# Patient Record
Sex: Male | Born: 1950
Health system: Southern US, Community
[De-identification: ages and names within clinical notes are randomized; demographics above are authoritative.]

## PROBLEM LIST (undated history)

## (undated) DIAGNOSIS — I2510 Atherosclerotic heart disease of native coronary artery without angina pectoris: Secondary | ICD-10-CM

## (undated) DIAGNOSIS — K219 Gastro-esophageal reflux disease without esophagitis: Secondary | ICD-10-CM

## (undated) DIAGNOSIS — I2089 Other forms of angina pectoris: Secondary | ICD-10-CM

## (undated) DIAGNOSIS — E785 Hyperlipidemia, unspecified: Secondary | ICD-10-CM

## (undated) DIAGNOSIS — I208 Other forms of angina pectoris: Secondary | ICD-10-CM

## (undated) DIAGNOSIS — D126 Benign neoplasm of colon, unspecified: Secondary | ICD-10-CM

## (undated) DIAGNOSIS — E079 Disorder of thyroid, unspecified: Secondary | ICD-10-CM

## (undated) DIAGNOSIS — K5730 Diverticulosis of large intestine without perforation or abscess without bleeding: Secondary | ICD-10-CM

## (undated) HISTORY — DX: Other forms of angina pectoris: I20.8

## (undated) HISTORY — DX: Disorder of thyroid, unspecified: E07.9

## (undated) HISTORY — DX: Benign neoplasm of colon, unspecified: D12.6

## (undated) HISTORY — DX: Hyperlipidemia, unspecified: E78.5

## (undated) HISTORY — DX: Gastro-esophageal reflux disease without esophagitis: K21.9

## (undated) HISTORY — DX: Diverticulosis of large intestine without perforation or abscess without bleeding: K57.30

## (undated) HISTORY — DX: Atherosclerotic heart disease of native coronary artery without angina pectoris: I25.10

## (undated) HISTORY — DX: Other forms of angina pectoris: I20.89

---
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
## 2004-12-03 ENCOUNTER — Ambulatory Visit: Payer: Self-pay | Admitting: Cardiology

## 2004-12-13 ENCOUNTER — Ambulatory Visit: Payer: Self-pay | Admitting: Cardiology

## 2004-12-17 ENCOUNTER — Ambulatory Visit: Payer: Self-pay | Admitting: Gastroenterology

## 2004-12-18 ENCOUNTER — Inpatient Hospital Stay (HOSPITAL_BASED_OUTPATIENT_CLINIC_OR_DEPARTMENT_OTHER): Admission: RE | Admit: 2004-12-18 | Discharge: 2004-12-18 | Payer: Self-pay | Admitting: Cardiology

## 2004-12-18 ENCOUNTER — Ambulatory Visit: Payer: Self-pay | Admitting: Cardiology

## 2004-12-30 ENCOUNTER — Ambulatory Visit: Payer: Self-pay | Admitting: Cardiology

## 2005-01-15 ENCOUNTER — Ambulatory Visit: Payer: Self-pay | Admitting: Cardiology

## 2005-01-16 ENCOUNTER — Ambulatory Visit: Payer: Self-pay | Admitting: Gastroenterology

## 2005-04-29 ENCOUNTER — Ambulatory Visit: Payer: Self-pay | Admitting: Cardiology

## 2006-03-23 ENCOUNTER — Ambulatory Visit: Payer: Self-pay | Admitting: Cardiology

## 2006-03-23 LAB — CONVERTED CEMR LAB
Albumin: 4.3 g/dL (ref 3.5–5.2)
Alkaline Phosphatase: 49 units/L (ref 39–117)
BUN: 12 mg/dL (ref 6–23)
CO2: 30 meq/L (ref 19–32)
Direct LDL: 146.4 mg/dL
GFR calc Af Amer: 80 mL/min
HDL: 35.7 mg/dL — ABNORMAL LOW (ref >39.0)
Potassium: 4.5 meq/L (ref 3.5–5.1)
Sodium: 140 meq/L (ref 135–145)
Total Protein: 7.5 g/dL (ref 6.0–8.3)
Triglycerides: 382 mg/dL (ref 0–149)
VLDL: 76 mg/dL — ABNORMAL HIGH (ref 0–40)

## 2006-08-07 ENCOUNTER — Ambulatory Visit: Payer: Self-pay | Admitting: Gastroenterology

## 2006-10-07 ENCOUNTER — Ambulatory Visit: Payer: Self-pay | Admitting: Gastroenterology

## 2006-10-07 ENCOUNTER — Encounter: Payer: Self-pay | Admitting: Gastroenterology

## 2007-01-18 ENCOUNTER — Ambulatory Visit: Payer: Self-pay | Admitting: Internal Medicine

## 2007-01-18 ENCOUNTER — Ambulatory Visit: Payer: Self-pay

## 2007-01-18 ENCOUNTER — Ambulatory Visit: Payer: Self-pay | Admitting: Cardiology

## 2007-01-18 LAB — CONVERTED CEMR LAB
Basophils Absolute: 0 10*3/uL (ref 0.0–0.1)
Basophils Relative: 0.4 % (ref 0.0–1.0)
Bilirubin, Direct: 0.1 mg/dL (ref 0.0–0.3)
CO2: 27 meq/L (ref 19–32)
Calcium: 9.3 mg/dL (ref 8.4–10.5)
Cholesterol: 201 mg/dL (ref 0–200)
Direct LDL: 124.9 mg/dL
GFR calc Af Amer: 73 mL/min
GFR calc non Af Amer: 60 mL/min
Glucose, Bld: 105 mg/dL — ABNORMAL HIGH (ref 70–99)
HDL: 34.2 mg/dL — ABNORMAL LOW (ref >39.0)
Hemoglobin: 13.5 g/dL (ref 13.0–17.0)
INR: 0.9 (ref 0.8–1.0)
Monocytes Absolute: 0.4 10*3/uL (ref 0.2–0.7)
Monocytes Relative: 7.8 % (ref 3.0–11.0)
Potassium: 4.2 meq/L (ref 3.5–5.1)
Prothrombin Time: 11.1 s (ref 10.9–13.3)
RBC: 4.43 M/uL (ref 4.22–5.81)
RDW: 12.7 % (ref 11.5–14.6)
Sodium: 136 meq/L (ref 135–145)
Total CHOL/HDL Ratio: 5.9
Total Protein: 7.2 g/dL (ref 6.0–8.3)
Triglycerides: 275 mg/dL (ref 0–149)
aPTT: 31.5 s — ABNORMAL HIGH (ref 21.7–29.8)

## 2007-01-20 ENCOUNTER — Inpatient Hospital Stay (HOSPITAL_BASED_OUTPATIENT_CLINIC_OR_DEPARTMENT_OTHER): Admission: RE | Admit: 2007-01-20 | Discharge: 2007-01-20 | Payer: Self-pay | Admitting: Internal Medicine

## 2007-01-20 ENCOUNTER — Ambulatory Visit: Payer: Self-pay | Admitting: Internal Medicine

## 2007-01-27 ENCOUNTER — Ambulatory Visit: Payer: Self-pay | Admitting: Cardiology

## 2007-06-12 DIAGNOSIS — D126 Benign neoplasm of colon, unspecified: Secondary | ICD-10-CM | POA: Insufficient documentation

## 2007-06-12 DIAGNOSIS — I25111 Atherosclerotic heart disease of native coronary artery with angina pectoris with documented spasm: Secondary | ICD-10-CM | POA: Insufficient documentation

## 2007-06-12 DIAGNOSIS — K5730 Diverticulosis of large intestine without perforation or abscess without bleeding: Secondary | ICD-10-CM | POA: Insufficient documentation

## 2007-06-12 DIAGNOSIS — E039 Hypothyroidism, unspecified: Secondary | ICD-10-CM | POA: Insufficient documentation

## 2007-06-12 DIAGNOSIS — K219 Gastro-esophageal reflux disease without esophagitis: Secondary | ICD-10-CM | POA: Insufficient documentation

## 2007-06-12 DIAGNOSIS — K921 Melena: Secondary | ICD-10-CM | POA: Insufficient documentation

## 2007-06-12 DIAGNOSIS — I2510 Atherosclerotic heart disease of native coronary artery without angina pectoris: Secondary | ICD-10-CM | POA: Insufficient documentation

## 2007-11-02 ENCOUNTER — Ambulatory Visit: Payer: Self-pay | Admitting: Cardiology

## 2008-09-08 ENCOUNTER — Encounter (INDEPENDENT_AMBULATORY_CARE_PROVIDER_SITE_OTHER): Payer: Self-pay | Admitting: *Deleted

## 2008-09-25 ENCOUNTER — Encounter: Payer: Self-pay | Admitting: Cardiology

## 2009-06-07 ENCOUNTER — Ambulatory Visit: Payer: Self-pay | Admitting: Cardiology

## 2009-12-24 ENCOUNTER — Encounter: Payer: Self-pay | Admitting: Cardiology

## 2010-01-01 ENCOUNTER — Encounter: Payer: Self-pay | Admitting: Cardiology

## 2010-03-14 NOTE — Assessment & Plan Note (Signed)
Summary: f1y per pt cakll/lg  Medications Added ASPIRIN 81 MG TBEC (ASPIRIN) Take one tablet by mouth daily VYTORIN 10-40 MG TABS (EZETIMIBE-SIMVASTATIN) 1 tab at bedtime TRICOR 48 MG TABS (FENOFIBRATE) 1 tab two times a day SYNTHROID 50 MCG TABS (LEVOTHYROXINE SODIUM) 1 tab once daily NITROSTAT 0.4 MG SUBL (NITROGLYCERIN) 1 tablet under tongue at onset of chest pain; you may repeat every 5 minutes for up to 3 doses.      Allergies Added: NKDA  Visit Type:  1 yr f/u Primary Provider:  Rudi Heap   History of Present Illness: Mr. Furness returns today for further evaluation and management of his coronary artery disease.  He's been having no angina or ischemic symptoms. He does get tired easily but has always been a problem.  He denies any palpitations, presyncope or syncope. He's had no orthopnea PND or peripheral edema.  He wonders if he can take Ranexa once a day. I reinforced he could not.  His blood work being followed by Dr. Christell Constant   Current Medications (verified): 1)  Ranexa 500 Mg Xr12h-Tab (Ranolazine) .Marland Kitchen.. 1 Tab Two Times A Day 2)  Aspirin 81 Mg Tbec (Aspirin) .... Take One Tablet By Mouth Daily 3)  Vytorin 10-40 Mg Tabs (Ezetimibe-Simvastatin) .Marland Kitchen.. 1 Tab At Bedtime 4)  Tricor 48 Mg Tabs (Fenofibrate) .Marland Kitchen.. 1 Tab Two Times A Day 5)  Synthroid 50 Mcg Tabs (Levothyroxine Sodium) .Marland Kitchen.. 1 Tab Once Daily 6)  Nitrostat 0.4 Mg Subl (Nitroglycerin) .Marland Kitchen.. 1 Tablet Under Tongue At Onset of Chest Pain; You May Repeat Every 5 Minutes For Up To 3 Doses.  Allergies (verified): No Known Drug Allergies  Past History:  Past Medical History: Last updated: 06/12/2007 Current Problems:  DIVERTICULOSIS, COLON (ICD-562.10) COLONIC POLYPS (ICD-211.3) GERD (ICD-530.81) UNSPECIFIED DISORDER OF THYROID (ICD-246.9) CAD (ICD-414.00) GUAIAC POSITIVE STOOL (ICD-578.1) HYPERLIPIDEMIA (ICD-272.4)  Review of Systems       negative other than history of present illness  Vital  Signs:  Patient profile:   60 year old male Height:      75.5 inches Weight:      192 pounds BMI:     23.77 Pulse rate:   53 / minute Pulse rhythm:   irregular BP sitting:   108 / 70  (left arm) Cuff size:   large  Vitals Entered By: Danielle Rankin, CMA (June 07, 2009 12:30 PM)  Physical Exam  General:  Well developed, well nourished, in no acute distress. Head:  normocephalic and atraumatic Eyes:  PERRLA/EOM intact; conjunctiva and lids normal. Neck:  Neck supple, no JVD. No masses, thyromegaly or abnormal cervical nodes. Chest Iviana Blasingame:  no deformities or breast masses noted Lungs:  Clear bilaterally to auscultation and percussion. Heart:  Non-displaced PMI, chest non-tender; regular rate and rhythm, S1, S2 without murmurs, rubs or gallops. Carotid upstroke normal, no bruit. Normal abdominal aortic size, no bruits. Femorals normal pulses, no bruits. Pedals normal pulses. No edema, no varicosities. Abdomen:  Bowel sounds positive; abdomen soft and non-tender without masses, organomegaly, or hernias noted. No hepatosplenomegaly. Msk:  Back normal, normal gait. Muscle strength and tone normal. Pulses:  pulses normal in all 4 extremities Extremities:  No clubbing or cyanosis. Neurologic:  Alert and oriented x 3.   Impression & Recommendations:  Problem # 1:  CAD (ICD-414.00) Assessment Unchanged  His updated medication list for this problem includes:    Ranexa 500 Mg Xr12h-tab (Ranolazine) .Marland Kitchen... 1 tab two times a day    Aspirin 81 Mg Tbec (Aspirin) .Marland KitchenMarland KitchenMarland KitchenMarland Kitchen  Take one tablet by mouth daily    Nitrostat 0.4 Mg Subl (Nitroglycerin) .Marland Kitchen... 1 tablet under tongue at onset of chest pain; you may repeat every 5 minutes for up to 3 doses.  Orders: EKG w/ Interpretation (93000)  Problem # 2:  HYPERLIPIDEMIA (ICD-272.4) i have asked him to make sure that Dr. Christell Constant checks his fasting lipids and liver panel once a year which I'm sure he is doing. I do not have a copy today His updated medication  list for this problem includes:    Vytorin 10-40 Mg Tabs (Ezetimibe-simvastatin) .Marland Kitchen... 1 tab at bedtime    Tricor 48 Mg Tabs (Fenofibrate) .Marland Kitchen... 1 tab two times a day  His updated medication list for this problem includes:    Vytorin 10-40 Mg Tabs (Ezetimibe-simvastatin) .Marland Kitchen... 1 tab at bedtime    Tricor 48 Mg Tabs (Fenofibrate) .Marland Kitchen... 1 tab two times a day  Patient Instructions: 1)  Your physician recommends that you schedule a follow-up appointment in: 1 YEAR WITH DR Kashton Mcartor 2)  Your physician recommends that you continue on your current medications as directed. Please refer to the Current Medication list given to you today. 3)  Your physician recommends that you return for lab work EA:VWUJ LIPIDS CHECKED  AT DR  Christell Constant OFF Prescriptions: NITROSTAT 0.4 MG SUBL (NITROGLYCERIN) 1 tablet under tongue at onset of chest pain; you may repeat every 5 minutes for up to 3 doses.  #25 x 9   Entered by:   Danielle Rankin, CMA   Authorized by:   Gaylord Shih, MD, [facility]   Signed by:   Danielle Rankin, CMA on 06/07/2009   Method used:   Electronically to        [facility] (retail)       [address]       [place], [place]  [address]       Ph: [phone]       Fax: [phone]   RxID:   [id number]

## 2010-04-30 ENCOUNTER — Encounter: Payer: Self-pay | Admitting: Family Medicine

## 2010-04-30 DIAGNOSIS — I2510 Atherosclerotic heart disease of native coronary artery without angina pectoris: Secondary | ICD-10-CM

## 2010-04-30 DIAGNOSIS — D126 Benign neoplasm of colon, unspecified: Secondary | ICD-10-CM | POA: Insufficient documentation

## 2010-04-30 DIAGNOSIS — E785 Hyperlipidemia, unspecified: Secondary | ICD-10-CM

## 2010-04-30 DIAGNOSIS — E782 Mixed hyperlipidemia: Secondary | ICD-10-CM | POA: Insufficient documentation

## 2010-04-30 DIAGNOSIS — K5730 Diverticulosis of large intestine without perforation or abscess without bleeding: Secondary | ICD-10-CM | POA: Insufficient documentation

## 2010-06-25 NOTE — Cardiovascular Report (Signed)
NAME:  [name], [name]              ACCOUNT NO.:  [id number]   MEDICAL RECORD NO.:  [id number]          PATIENT TYPE:  OIB   LOCATION:  NA                           FACILITY:  [facility]   PHYSICIAN:  Bevelyn Buckles. Bensimhon, MDDATE OF BIRTH:  08-15-1950   DATE OF PROCEDURE:  01/20/2007  DATE OF DISCHARGE:                            CARDIAC CATHETERIZATION   PRIMARY CARE PHYSICIAN:  Dr. Rudi Heap in [place].  Cardiologist, Dr.  Juanito Doom.   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  He underwent  catheterization in November 2006, and was found to have one-vessel  coronary artery disease with a totally occluded right coronary artery  with copious left-to-right and right-to-right collaterals.  He recently  underwent routine stress testing and had positive EKG with mild chest  burning.  He is referred for cardiac catheterization.  This was done in  the outpatient catheterization lab.   PROCEDURES PERFORMED:  1. Selective coronary angiography.  2. Left heart catheterization.  3. Left ventriculogram.   DESCRIPTION OF PROCEDURE:  The risks and indications of catheterization  were explained.  Consent was signed and placed on the chart.  A 4-French  arterial sheath was placed in the right femoral artery using a modified  Seldinger technique.  Standard catheters including JL-5, 3-D RC and  angled pigtail used for procedure.  All catheter exchanges made over  wire.  There are no apparent complications.   HEMODYNAMIC DATA:  Central aortic pressure was 113/68 with a mean of 87.  LV pressure is 113/1 with an EDP of 9.  There was no aortic stenosis.   Left main was normal.   LAD was a long vessel coursing the apex.  It gave off a large branching  diagonal at its proximal portion.  In the LAD proper, there was a 30%  stenosis just after the takeoff of the diagonal branch.  In the proximal  portion of the diagonal  branch, there was a 40-50% stenosis.  There  appeared to be a very small intramyocardial bridging segment in the  distal LAD.   Left circumflex was moderate-sized system and gave off a tiny ramus, a  large OM-1, a small OM-2 and was angiographically normal.   Right coronary artery was a dominant vessel.  It was totally occluded in  the proximal section after giving off a large right ventricular marginal  branch.  The marginal branch supplied collaterals to the PDA and filled  the entire distal right coronary artery well to reveal several small  posterolaterals and small acute marginal branch.  There are also copious  left-to-right collaterals.   Left ventriculogram done in the RAO position showed an EF of 60-65% with  no regional wall motion abnormalities.   ASSESSMENT:  1. Stable, one-vessel coronary artery disease with no change from      previous.  2. Normal left ventricular function.   PLAN:  Will continue with medical therapy.      Bevelyn Buckles. Bensimhon, MD  Electronically Signed     DRB/MEDQ  D:  01/20/2007  T:  01/21/2007  Job:  [id number]   cc:   [name], M.D.

## 2010-06-25 NOTE — H&P (Signed)
NAME:  [name], [name]              ACCOUNT NO.:  [id number]   MEDICAL RECORD NO.:  [id number]            PATIENT TYPE:   LOCATION:                                 FACILITY:   PHYSICIAN:  Bevelyn Buckles. Bensimhon, MD     DATE OF BIRTH:   DATE OF ADMISSION:  DATE OF DISCHARGE:                              HISTORY & PHYSICAL   PRIMARY CARE PHYSICIAN:  Dr. Vernon Prey.   CARDIOLOGIST:  Dr. Juanito Doom.   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 He underwent cardiac catheterization in 2006 after a  positive stress test. It showed a normal ejection fraction. The left  main was normal. The LAD had a 40-50% stenosis proximally. The  circumflex was normal. The right coronary artery was occluded proximally  with right to right and left to right collaterals. He has been treated  medically. He has had some problems with chronic angina and has been  treated with Ranexa.   Over the past few months, he has been having some exertional dyspnea,  but he has been under a lot of stress. He does also have occasional  chest burning. He talked to Dr. Daleen Squibb who referred for stress test.  Today, he came in for a treadmill Myoview. He walked 7 minutes and 12  seconds on Bruce protocol. He developed some chest burning. However, he  was only able to get his heart rate up to 104. He was about to be  transitioned over to adenosine. However, then he developed significant  ST depression and T-wave inversion in multiple leads. Stress techs came  and then discussed the situation with me.   On review of systems, he denies any heart failure. No orthopnea, no PND,  no lower extremity edema.   PAST MEDICAL HISTORY:  1. Coronary artery disease as described above.  2. Hypertension.  3. Hyperlipidemia   CURRENT MEDICATIONS:  1. Aspirin 81 a day.  2. Ranexa 500 b.i.d.  3. Vytorin 10/40.  4. TriCor 48 b.i.d.  5. Synthroid 50 mcg a day.   PHYSICAL EXAMINATION:  He is in no acute distress. Blood pressure was  132/90, heart rate was 57.  HEENT:  Was normal.  NECK:  Was supple. No JVD. Carotids were 2+ bilaterally without bruits.  There was no lymphadenopathy or thyromegaly.  CARDIAC:  Regular rate and rhythm. No murmurs, rubs, or gallops. PMI is  nondisplaced.  LUNGS:  Are clear.  ABDOMEN:  Was soft, nontender, nondistended. There is no  hepatosplenomegaly, no bruits, no masses. Good bowel sounds.  EXTREMITIES:  Warm with no cyanosis, clubbing or edema. No rash.  NEUROLOGICAL:  Alert and oriented x3. Cranial nerves II-XII are intact.  Moves all four extremities without difficulty.   ASSESSMENT AND PLAN:  1. Coronary artery disease with positive treadmill stress test.  2. Hypertension.  3. Hyperlipidemia.   PLAN/DISCUSSION:  I discussed the situation with Dr. Daleen Squibb as well as Mr.  Chance. While it is certainly possible that his EKG abnormalities are  related to his chronically occluded right coronary, he has been having  somewhat more symptoms. It has now been well over two years since his  previous catheterization. I think it is very reasonable to proceed with  repeat catheterization to reassess his coronary anatomy. This will be  planned for tomorrow in the outpatient catheterization lab.      Bevelyn Buckles. Bensimhon, MD  Electronically Signed     DRB/MEDQ  D:  01/18/2007  T:  01/18/2007  Job:  [id number]   cc:   Ernestina Penna, M.D.

## 2010-06-25 NOTE — Assessment & Plan Note (Signed)
[facility]                            CARDIOLOGY OFFICE NOTE   NAME:[name], [name]                     MRN:          [id number]  DATE:01/27/2007                            DOB:          1950-07-25    [name] comes in today after having an outpatient cardiac  catheterization.   The cath was done on January 20, 2007 for chest pain.  When he was  undergoing routine stress testing, he had a positive EKG and mild chest  burning.   His catheterization showed stable coronary anatomy.  He had stable one  vessel disease with no change.  He had a normal left ventricular  function.  He had a totally occluded right with extensive left to right  collaterals.   He also has mixed hyperlipidemia and pre-cath labs demonstrated an HDL  of 34.2, total cholesterol 201, triglycerides 275, LDL 124.  The rest of  his blood work was within normal limits, including LFTs.   He is having no angina.   MEDICATIONS:  1. Aspirin 81 mg daily.  2. Ranexa 500 mg p.o. b.i.d.  3. Aciphex 20 mg p.r.n.  4. Vytorin 10/40 daily.  5. Tricor 48 mg b.i.d., which he started today.  6. Synthroid 50 mcg a day.   PHYSICAL EXAMINATION:  His blood pressure is 120/82.  His pulse is 60  and regular.  His weight is 193.  HEENT:  Normocephalic and atraumatic.  PERRLA.  Extraocular movements  are intact.  Sclerae are clear.  Facial symmetry is normal.  NECK:  Carotids are equal bilaterally without bruits.  No JVD.  Thyroid  is not enlarged.  Trachea is midline.  LUNGS:  Clear.  HEART:  Regular rate and rhythm without gallop.  ABDOMEN:  Soft with good bowel sounds.  EXTREMITIES:  No clubbing, cyanosis or edema.  The cath site is stable.  NEUROLOGIC:  Intact.   EKG is stable.   [name] is doing well.  I have asked him to continue with his  current medications, including staying with the Tricor.  I hope this  will improve his mixed hyperlipidemia.  He will also remain on  Vytorin.   I will plan on seeing him back in six months.     Thomas C. Daleen Squibb, MD, [facility]  Electronically Signed    TCW/MedQ  DD: 01/27/2007  DT: 01/27/2007  Job #: [id number]   cc:   Ernestina Penna, M.D.

## 2010-06-25 NOTE — Assessment & Plan Note (Signed)
[facility]                            CARDIOLOGY OFFICE NOTE   NAME:BENNETTMatthan, [name]                     MRN:          [id number]  DATE:11/02/2007                            DOB:          1951-01-26    [name] comes in today for followup.  He has had no angina or  ischemic symptoms.  He denies any orthopnea, PND or peripheral edema.  He is active working at [employer] and also he has been playing and  exercising quite a bit.   His last catheterization was January 20, 2007, resulted from an  exercise stress test that was stopped secondary to chest pain and EKG  changes.   His anatomy was stable with single-vessel disease.   He also has a mixed hyperlipidemia and is being followed by [facility].  He is due blood work soon.  He has been  very compliant with his diet.   CURRENT MEDICATIONS:  1. Aspirin 81 mg a day.  2. Ranexa 500 mg p.o. b.i.d.  3. Vytorin 10/40 daily.  4. TriCor 48 mg daily.  5. Synthroid 50 mcg daily.   PHYSICAL EXAMINATION:  VITAL SIGNS:  His blood pressure today is 130/78,  his pulse is 62 and regular, his weight is 189 down 4.  HEENT:  Normal.  NECK:  Carotid upstrokes were equal bilaterally without bruits, no JVD.  Thyroid is not enlarged.  Trachea is midline.  LUNGS:  Clear.  HEART:  Regular rate and rhythm.  Normal S1, S2.  PMI is nondisplaced.  ABDOMEN:  Soft, good bowel sounds.  No midline bruit.  No pulsatile  mass.  No hepatomegaly.  EXTREMITIES:  No cyanosis, clubbing or edema.  Pulses are intact.  NEUROLOGICAL:  Intact.   ASSESSMENT AND PLAN:  [name] is doing well.  We will continue his  current medical program.  He has done very well on the Ranexa.  I will  plan on seeing him back again in a year unless he is having breakthrough  angina or more symptoms.     Thomas C. Daleen Squibb, MD, [facility]  Electronically Signed   TCW/MedQ  DD: 11/02/2007  DT: 11/03/2007  Job #:  [id number]   cc:   [facility]

## 2010-06-25 NOTE — Assessment & Plan Note (Signed)
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  He comes to my office today  for evaluation of a couple of weeks of left lower quadrant discomfort  and general malaise with occasional bright red blood per rectum.  He  denies nausea and vomiting or any upper GI or hepatobiliary complaints.  He is on a regular diet.  He has had previous GI evaluations with his  last colonoscopy in October of 2001, which showed diverticulosis, but  otherwise was unremarkable.  He was last seen in my office in November  of 2006, with chest pain that was felt to be cardiac in origin.  Patient  was supposed to have endoscopy, which was never completed.  Patient was  hospitalized and had cardiac catheterization around that time and had  occlusive right coronary artery, but was not treated surgically.   1. He currently is on aspirin 81 mg a day.  2. Ranexa 500 mg twice a day.  3. AcipHex 20 mg a day.  4. Vytorin 10-40 daily.  5. Tricor 48 mg twice a day.  6. Synthroid 50 micrograms a day.   He has a vague history of PENICILLIN allergy.   Today, the patient denies chest pain or any cardiopulmonary complaints.  He is followed closely by Dr. Daleen Squibb and Dr. Rudi Heap.  As mentioned  above, he never completed his endoscopic exam, but is on PPI therapy.   Lab data from February of this past year showed normal liver function  tests.   He is a healthy-appearing white male in no distress, appearing his  stated age.  I cannot appreciate stigmata of chronic liver disease.  His  abdominal exam shows no hepatosplenomegaly, masses, but there  is  tenderness in the left lower quadrant over the sigmoid colon to deep  palpation.  Bowel sounds are normal.  Rectal exam showed mucusy stool  present and was trace guaiac-positive.   ASSESSMENT:  1. Probable subacute diverticulitis.  2. Guaiac-positive stools - rule out underlying colitis or colonic      polyposis.  3. Probable chronic acid-reflux disease, doing well on PPI therapy.      Patient denies dysphagia at this time.  4. Coronary artery disease, managed medically, as mentioned above.  5. History of mixed hyperlipidemia.  6. Chronic thyroid dysfunction.   RECOMMENDATIONS:  1. Low-fiber diet with p.r.n. Pamine Forte 5 mg every 12 hours.  2. Cipro 500 mg twice a day and metronidazole 500 mg twice a day for      ten days.  3. Colonoscopy in three weeks' time.  4. Check CBC and iron studies.  5. Continue other medications as per Drs. Wall and Moore.     Vania Rea.  Jarold Motto, MD, [facility], FAGA  Electronically Signed    DRP/MedQ  DD: 08/07/2006  DT: 08/07/2006  Job #: [id number]   cc:   Thomas C. Daleen Squibb, MD, [facility] [place]  Ernestina Penna, M.D.

## 2010-06-28 NOTE — Assessment & Plan Note (Signed)
[facility]                            CARDIOLOGY OFFICE NOTE   NAME:BENNETTCardin, Nitschke                     MRN:          [id number]  DATE:03/23/2006                            DOB:          06-28-1950    Mr. Qin comes in today for further management of the following  issues;  1. Coronary artery disease. He has a totally occluded right coronary      artery with collaterals. He has nonobstructive coronary disease in      his other vessels. He has normal left ventricular systolic      function. He was having angina but that has resolved on Ranexa. He      continues to do well on Ranexa.  2. Mixed hyperlipidemia. He is on Vytorin 10/40 and Tricor 48 mg p.o.      b.i.d. because he refused to take 160 mg a day with [facility]. I do not have any recent blood work.      His blood work in December of 2007 was far from goal. I have      discussed this with him today.   He is currently on:  1. Aspirin 81 mg a day.  2. Ranexa 500 b.i.d.  3. AcipHex q. day.  4. Vytorin 10/40.  5. Tricor 48 b.i.d.  6. Synthroid 50 mcg a day.   His blood pressure is 120/76, his pulse is 60 and regular, his weight is  191, up 4 pounds.  HEENT: Normocephalic, atraumatic. PERRLA. Extraocular movements intact.  Sclera clear. Facial symmetry is normal, dentition satisfactory.  Carotid upstrokes are equal bilaterally without bruits. There is no JVD.  Thyroid is not enlarged. Trachea is midline.  LUNGS: Clear.  HEART: Reveals a regular rate and rhythm, without murmur, rub, or  gallop.  ABDOMINAL EXAM: Soft, good bowel sounds, no midline bruits. There is no  hepatomegaly.  EXTREMITIES: Reveal no cyanosis, clubbing, or edema. Pulses are brisk.  NEUROLOGICAL EXAM: Intact.  SKIN: Unremarkable.   EKG today shows sinus brady with low voltage and nonspecific ST segment  changes.   I have had about a 20 minute discussion with Mr. Ketcham again today  concerning his coronary disease, his lipids, and the importance of  getting his lipids at goal. He has heard a lot of controversy about  Vytorin and Zetia and had multiple questions about this today. I have  tried to make it clear to him that this is important to continue and  that the [employer] of [employer] endorses this at present. He  also asked me about the importance of Synthroid and I have told him to  stay on that and follow up blood work with [facility].   He is fasting today and has me to check lipids and LFTs. We will  check  these and I will give him some direct input. Otherwise, I will see him  back in November of 2008, at which time he will need a stress nuclear  study.     Thomas C. Wall, MD, [facility]  Electronically Signed    TCW/MedQ  DD: 03/23/2006  DT: 03/23/2006  Job #: [id number]   cc:   Ernestina Penna, M.D.

## 2010-06-28 NOTE — Cardiovascular Report (Signed)
NAME:  [name], [name] NO.:  [id number]   MEDICAL RECORD NO.:  [id number]          PATIENT TYPE:  OIB   LOCATION:  1962                         FACILITY:  [facility]   PHYSICIAN:  Jonelle Sidle, M.D. LHCDATE OF BIRTH:  03/13/1950   DATE OF PROCEDURE:  DATE OF DISCHARGE:                              CARDIAC CATHETERIZATION   INDICATIONS:  [name] is a 60 year old male with a history of  hyperlipidemia, remote tobacco use, and exertional angina with mild  shortness of breath.  He apparently had an episode of more significant chest  pain back in early October and was referred for further evaluation, on  December 03, 2004, to Dr. Daleen Squibb.  Cardiac catheterization was discussed at  that time with the patient and this was scheduled as an outpatient.  I met  with [name] prior to the procedure and discussed situation with the  patient and his son.  We again reviewed the potential risks and benefits of  the procedure and he was in agreement to proceed signing for informed  consent in advance.   PROCEDURES PERFORMED:  1.  Left heart catheterization  2.  Selective coronary angiography.  3.  Ventriculography.   ACCESS AND EQUIPMENT:  The area about the right femoral artery was  anesthetized with 1% lidocaine and 4-French sheath was placed in the right  femoral artery via the modified Seldinger technique.  Standard preformed 4-  [place] JL-4 and JR-4 catheters were used for selective coronary angiography  and an angled pigtail catheter was used following placement of a exchange  wire in the left ventricle using the JR-4 catheter for assessment of the  left ventricular pressures and left ventriculography.  All exchanges were  made over wire and the patient tolerated the procedure well without  immediate complications.   HEMODYNAMIC RESULTS:  1.  Aorta is 117/72-mmHg.  2.  Left ventricle 109/12-mmHg.   ANGIOGRAPHIC FINDINGS:  1.  The left main coronary artery is  free of significant flow-limiting      coronary atherosclerosis and gives rise to the circumflex and left      anterior descending coronary arteries.  2.  The left anterior descending is a medium caliber vessel, wrapping to the      apex of the ventricle.  There is a large bifurcating proximal diagonal      branch that has approximately 40-50% stenosis in its proximal portion.      There is 20% stenosis in the left anterior descending proper in this      distribution.  No flow-limiting stenoses are noted.  3.  The circumflex coronary is medium in caliber with a large branching      obtuse marginal.  Minor luminal irregularities are noted without any      significant flow-limiting stenoses.  4.  The right coronary artery is occluded in its proximal segment just after      the takeoff of a fairly large right ventricular marginal branch.  The      distal vessel and posterior descending branch fill via right-to-right      collaterals from the  right ventricular branch as well as left-to-right      collaterals from the left anterior descending.  These collaterals are      fairly well-developed and opacification of the distal right coronary      artery is a fairly reasonable on injections of the left coronary system.   LEFT VENTRICULOGRAPHY:  Performed in the RAO projection.  Reveals an  ejection fraction of approximately 55% with trace mitral regurgitation.   DIAGNOSES:  1.  Single-vessel obstructive coronary artery disease with an occluded      proximal right coronary artery that has distal filling via well-      developed right-to-right and left-to-right collaterals as outlined      above.  2.  Left ventricular ejection fraction approximately 55% with trace mitral      regurgitation and a left ventricular end-diastolic pressure of 12-mmHg.   I reviewed the films with Dr. Riley Kill.  It would seem reasonable that  occlusion of the right coronary artery could have occurred back in early   October when the patient's more dramatic symptoms were reported.  Angiographically, the area of occlusion in the right coronary artery looks  to be reasonably long and does involve the large right ventricular marginal  branch that itself provides right-to-right collaterals to the distal right  coronary artery.  Given these concerns, Dr. Rosalyn Charters recommendation was to  consider initial trial of anti-anginal therapy with close followup in the  office. Intervention on this area could be considered, although at risk of  compromise of the collateral system and with uncertainty as to the success  of long term patency.  I discussed this with the patient and his son and  they were agreement with plan at present.           ______________________________  Jonelle Sidle, M.D. [facility]     SGM/MEDQ  D:  12/18/2004  T:  12/18/2004  Job:  [id number]   cc:   Thomas C. Wall, M.D.  [address]  [address]  [place]  [place] [address]   Ernestina Penna, M.D.  Fax: [phone]

## 2010-12-24 ENCOUNTER — Encounter: Payer: Self-pay | Admitting: Cardiology

## 2011-02-25 ENCOUNTER — Encounter: Payer: Self-pay | Admitting: *Deleted

## 2011-02-25 ENCOUNTER — Ambulatory Visit: Payer: Self-pay | Admitting: Cardiology

## 2011-03-27 ENCOUNTER — Ambulatory Visit (INDEPENDENT_AMBULATORY_CARE_PROVIDER_SITE_OTHER): Payer: BC Managed Care – PPO | Admitting: Cardiology

## 2011-03-27 ENCOUNTER — Encounter: Payer: Self-pay | Admitting: Cardiology

## 2011-03-27 VITALS — BP 131/80 | HR 54 | Wt 188.0 lb

## 2011-03-27 DIAGNOSIS — I2510 Atherosclerotic heart disease of native coronary artery without angina pectoris: Secondary | ICD-10-CM

## 2011-03-27 DIAGNOSIS — E785 Hyperlipidemia, unspecified: Secondary | ICD-10-CM

## 2011-03-27 NOTE — Assessment & Plan Note (Signed)
Suboptimal control. Patient advised to have repeat labs with the lipid clinic at Dr. Kathi Der office. Goal LDL will be as low as possible, certainly below 100.

## 2011-03-27 NOTE — Patient Instructions (Signed)
Your physician wants you to follow-up in:  12 months.  You will receive a reminder letter in the mail two months in advance. If you don't receive a letter, please call our office to schedule the follow-up appointment.   

## 2011-03-27 NOTE — Progress Notes (Signed)
HPI Mr. [name] comes in today for evaluation and management of his coronary disease. He is now retired from being a golf pro and is not exercising quite as much. However, he denies any angina or ischemic symptoms when he exerts himself.  He is compliant with his medications. His laboratory data is followed by Dr. Christell Constant. His last LDL was 126 switch to Crestor. He also takes fenofibrate. He carries nitroglycerin.  Past Medical History  Diagnosis Date  . Diverticulosis of colon (without mention of hemorrhage)   . Other and unspecified hyperlipidemia   . Benign neoplasm of colon   . Coronary atherosclerosis of unspecified type of vessel, native or graft   . Esophageal reflux   . Unspecified disorder of thyroid   . Blood in stool     Current Outpatient Prescriptions  Medication Sig Dispense Refill  . aspirin 81 MG EC tablet Take 81 mg by mouth daily.        . CRESTOR 20 MG tablet Take 1 tablet by mouth Daily.      . fenofibrate (TRICOR) 48 MG tablet Take 48 mg by mouth 2 (two) times daily.        Marland Kitchen levothyroxine (SYNTHROID, LEVOTHROID) 50 MCG tablet Take 50 mcg by mouth daily.        . nitroGLYCERIN (NITROSTAT) 0.4 MG SL tablet Place 0.4 mg under the tongue every 5 (five) minutes as needed.      . RABEprazole (ACIPHEX) 20 MG tablet Take 20 mg by mouth 2 (two) times daily as needed.        . ranolazine (RANEXA) 500 MG 12 hr tablet Take 500 mg by mouth 2 (two) times daily.          No Known Allergies  No family history on file.  History   Social History  . Marital Status: Widowed    Spouse Name: N/A    Number of Children: N/A  . Years of Education: N/A   Occupational History  . Not on file.   Social History Main Topics  . Smoking status: Never Smoker   . Smokeless tobacco: Not on file  . Alcohol Use: Not on file  . Drug Use: Not on file  . Sexually Active: Not on file   Other Topics Concern  . Not on file   Social History Narrative  . No narrative on file    ROS ALL  NEGATIVE EXCEPT THOSE NOTED IN HPI  PE  General Appearance: well developed, well nourished in no acute distress HEENT: symmetrical face, PERRLA, good dentition  Neck: no JVD, thyromegaly, or adenopathy, trachea midline Chest: symmetric without deformity Cardiac: PMI non-displaced, RRR, normal S1, S2, no gallop or murmur Lung: clear to ausculation and percussion Vascular: all pulses full without bruits  Abdominal: nondistended, nontender, good bowel sounds, no HSM, no bruits Extremities: no cyanosis, clubbing or edema, no sign of DVT, no varicosities  Skin: normal color, no rashes Neuro: alert and oriented x 3, non-focal Pysch: normal affect  EKG Sinus bradycardia , otherwise normal EKG BMET    Component Value Date/Time   NA 136 01/18/2007 0846   K 4.2 01/18/2007 0846   CL 103 01/18/2007 0846   CO2 27 01/18/2007 0846   GLUCOSE 105* 01/18/2007 0846   BUN 10 01/18/2007 0846   CREATININE 1.3 01/18/2007 0846   CALCIUM 9.3 01/18/2007 0846   GFRNONAA 60 01/18/2007 0846   GFRAA 73 01/18/2007 0846    Lipid Panel     Component Value Date/Time  CHOL 201* 01/18/2007 0846   TRIG 275* 01/18/2007 0846   HDL 34.2* 01/18/2007 0846   CHOLHDL 5.9 CALC 01/18/2007 0846   VLDL 55* 01/18/2007 0846    CBC    Component Value Date/Time   WBC 5.2 01/18/2007 1053   RBC 4.43 01/18/2007 1053   HGB 13.5 01/18/2007 1053   HCT 39.1 01/18/2007 1053   PLT 289 01/18/2007 1053   MCV 88.2 01/18/2007 1053   MCHC 34.6 01/18/2007 1053   RDW 12.7 01/18/2007 1053   MONOABS 0.4 01/18/2007 1053   EOSABS 0.2 01/18/2007 1053   BASOSABS 0.0 01/18/2007 1053

## 2011-03-27 NOTE — Assessment & Plan Note (Signed)
Stable. No change in medical therapy. 

## 2011-05-01 ENCOUNTER — Encounter: Payer: Self-pay | Admitting: Cardiology

## 2011-05-20 ENCOUNTER — Encounter: Payer: Self-pay | Admitting: Internal Medicine

## 2011-06-23 ENCOUNTER — Encounter: Payer: Self-pay | Admitting: Cardiology

## 2011-10-08 ENCOUNTER — Encounter: Payer: Self-pay | Admitting: Gastroenterology

## 2011-10-27 ENCOUNTER — Encounter: Payer: Self-pay | Admitting: Cardiology

## 2011-12-25 ENCOUNTER — Encounter: Payer: Self-pay | Admitting: Gastroenterology

## 2012-02-02 ENCOUNTER — Encounter: Payer: Self-pay | Admitting: Gastroenterology

## 2012-02-02 ENCOUNTER — Ambulatory Visit (AMBULATORY_SURGERY_CENTER): Payer: BC Managed Care – PPO | Admitting: *Deleted

## 2012-02-02 VITALS — Ht 75.0 in | Wt 197.0 lb

## 2012-02-02 DIAGNOSIS — Z1211 Encounter for screening for malignant neoplasm of colon: Secondary | ICD-10-CM

## 2012-02-02 MED ORDER — MOVIPREP 100 G PO SOLR: ORAL | Status: DC

## 2012-02-16 ENCOUNTER — Encounter: Payer: Self-pay | Admitting: Gastroenterology

## 2012-02-16 ENCOUNTER — Ambulatory Visit (AMBULATORY_SURGERY_CENTER): Payer: BC Managed Care – PPO | Admitting: Gastroenterology

## 2012-02-16 VITALS — BP 120/72 | HR 53 | Temp 97.8°F | Resp 21 | Ht 75.0 in | Wt 197.0 lb

## 2012-02-16 DIAGNOSIS — K921 Melena: Secondary | ICD-10-CM

## 2012-02-16 DIAGNOSIS — K5730 Diverticulosis of large intestine without perforation or abscess without bleeding: Secondary | ICD-10-CM

## 2012-02-16 DIAGNOSIS — Z860100 Personal history of colon polyps, unspecified: Secondary | ICD-10-CM

## 2012-02-16 DIAGNOSIS — D126 Benign neoplasm of colon, unspecified: Secondary | ICD-10-CM

## 2012-02-16 DIAGNOSIS — Z1211 Encounter for screening for malignant neoplasm of colon: Secondary | ICD-10-CM

## 2012-02-16 MED ORDER — SODIUM CHLORIDE 0.9 % IV SOLN: 500.0000 mL | INTRAVENOUS | Status: DC

## 2012-02-16 NOTE — Op Note (Signed)
[facility] [address] [place], [address]   COLONOSCOPY PROCEDURE REPORT  PATIENT: [name], [name]  MR#: [id number] BIRTHDATE: 07/29/1950 , 61  yrs. old GENDER: Male ENDOSCOPIST: Mardella Layman, MD, [facility] REFERRED BY: PROCEDURE DATE:  02/16/2012 PROCEDURE:   Colonoscopy with snare polypectomy ASA CLASS:   Class III INDICATIONS:Patient's personal history of adenomatous colon polyps.  MEDICATIONS: propofol (Diprivan) 250mg  IV  DESCRIPTION OF PROCEDURE:   After the risks and benefits and of the procedure were explained, informed consent was obtained.  A digital rectal exam revealed no abnormalities of the rectum.    The LB [id number]  endoscope was introduced through the anus and advanced to the cecum, which was identified by both the appendix and ileocecal valve .  The quality of the prep was excellent, using MoviPrep .  The instrument was then slowly withdrawn as the colon was fully examined.     COLON FINDINGS: Moderate diverticulosis was noted in the descending colon and sigmoid colon.   A firm sessile polyp was found in the descending colon.  A polypectomy was performed using snare cautery. The resection was complete and the polyp tissue was completely retrieved.     Retroflexed views revealed no abnormalities.     The scope was then withdrawn from the patient and the procedure completed.  COMPLICATIONS: There were no complications. ENDOSCOPIC IMPRESSION: 1.   Moderate diverticulosis was noted in the descending colon and sigmoid colon 2.   Sessile polyp was found in the descending colon; polypectomy was performed using snare cautery  RECOMMENDATIONS: 1.  Repeat Colonscopy in 3-5 years. 2.  Metamucil or benefiber 3.  High fiber diet   REPEAT EXAM:  XB:JYNWGN Christell Constant, MD  _______________________________ eSigned:  Mardella Layman, MD, [facility] 02/16/2012 9:02 AM

## 2012-02-16 NOTE — Patient Instructions (Addendum)
Findings:  Polyp, Diverticulosis Recommendations:  Repeat colonoscopy in 3-5 years, Metamucil or benefiber, High Fiber Diet  YOU HAD AN ENDOSCOPIC PROCEDURE TODAY AT THE [facility]: Refer to the procedure report that was given to you for any specific questions about what was found during the examination.  If the procedure report does not answer your questions, please call your gastroenterologist to clarify.  If you requested that your care partner not be given the details of your procedure findings, then the procedure report has been included in a sealed envelope for you to review at your convenience later.  YOU SHOULD EXPECT: Some feelings of bloating in the abdomen. Passage of more gas than usual.  Walking can help get rid of the air that was put into your GI tract during the procedure and reduce the bloating. If you had a lower endoscopy (such as a colonoscopy or flexible sigmoidoscopy) you may notice spotting of blood in your stool or on the toilet paper. If you underwent a bowel prep for your procedure, then you may not have a normal bowel movement for a few days.  DIET: Your first meal following the procedure should be a light meal and then it is ok to progress to your normal diet.  A half-sandwich or bowl of soup is an example of a good first meal.  Heavy or fried foods are harder to digest and may make you feel nauseous or bloated.  Likewise meals heavy in dairy and vegetables can cause extra gas to form and this can also increase the bloating.  Drink plenty of fluids but you should avoid alcoholic beverages for 24 hours.  ACTIVITY: Your care partner should take you home directly after the procedure.  You should plan to take it easy, moving slowly for the rest of the day.  You can resume normal activity the day after the procedure however you should NOT DRIVE or use heavy machinery for 24 hours (because of the sedation medicines used during the test).    SYMPTOMS TO REPORT  IMMEDIATELY: A gastroenterologist can be reached at any hour.  During normal business hours, 8:30 AM to 5:00 PM Monday through Friday, call [phone].  After hours and on weekends, please call the GI answering service at [phone] who will take a message and have the physician on call contact you.   Following lower endoscopy (colonoscopy or flexible sigmoidoscopy):  Excessive amounts of blood in the stool  Significant tenderness or worsening of abdominal pains  Swelling of the abdomen that is new, acute  Fever of 100F or higher  Following upper endoscopy (EGD)  Vomiting of blood or coffee ground material  New chest pain or pain under the shoulder blades  Painful or persistently difficult swallowing  New shortness of breath  Fever of 100F or higher  Black, tarry-looking stools  FOLLOW UP: If any biopsies were taken you will be contacted by phone or by letter within the next 1-3 weeks.  Call your gastroenterologist if you have not heard about the biopsies in 3 weeks.  Our staff will call the home number listed on your records the next business day following your procedure to check on you and address any questions or concerns that you may have at that time regarding the information given to you following your procedure. This is a courtesy call and so if there is no answer at the home number and we have not heard from you through the emergency physician on call, we will  assume that you have returned to your regular daily activities without incident.  SIGNATURES/CONFIDENTIALITY: You and/or your care partner have signed paperwork which will be entered into your electronic medical record.  These signatures attest to the fact that that the information above on your After Visit Summary has been reviewed and is understood.  Full responsibility of the confidentiality of this discharge information lies with you and/or your care-partner.   Please follow all discharge instructions given to you  by the recovery room nurse. If you have any questions or problems after discharge please call one of the numbers listed above. You will receive a phone call in the am to see how you are doing and answer any questions you may have. Thank you for choosing [facility] for your health care needs.

## 2012-02-16 NOTE — Progress Notes (Signed)
Called to room to assist during endoscopic procedure.  Patient ID and intended procedure confirmed with present staff. Received instructions for my participation in the procedure from the performing physician.  

## 2012-02-16 NOTE — Progress Notes (Signed)
Patient did not experience any of the following events: a burn prior to discharge; a fall within the facility; wrong site/side/patient/procedure/implant event; or a hospital transfer or hospital admission upon discharge from the facility. (G8907) Patient did not have preoperative order for IV antibiotic SSI prophylaxis. (G8918)  

## 2012-02-17 ENCOUNTER — Telehealth: Payer: Self-pay | Admitting: *Deleted

## 2012-02-17 NOTE — Telephone Encounter (Signed)
Left message to call office if questions or concerns. 

## 2012-02-20 ENCOUNTER — Encounter: Payer: Self-pay | Admitting: Gastroenterology

## 2012-04-07 ENCOUNTER — Other Ambulatory Visit (HOSPITAL_COMMUNITY): Payer: Self-pay | Admitting: Physician Assistant

## 2012-04-07 ENCOUNTER — Other Ambulatory Visit (HOSPITAL_COMMUNITY): Payer: Self-pay | Admitting: Family Medicine

## 2012-04-07 ENCOUNTER — Ambulatory Visit (HOSPITAL_COMMUNITY)
Admission: RE | Admit: 2012-04-07 | Discharge: 2012-04-07 | Disposition: A | Discharge status: Home / Self Care | Payer: BC Managed Care – PPO | Source: Ambulatory Visit | Attending: Physician Assistant | Admitting: Physician Assistant

## 2012-04-07 DIAGNOSIS — M79609 Pain in unspecified limb: Secondary | ICD-10-CM | POA: Insufficient documentation

## 2012-04-07 DIAGNOSIS — R609 Edema, unspecified: Secondary | ICD-10-CM

## 2012-04-07 DIAGNOSIS — R52 Pain, unspecified: Secondary | ICD-10-CM

## 2012-04-07 DIAGNOSIS — M7989 Other specified soft tissue disorders: Secondary | ICD-10-CM | POA: Insufficient documentation

## 2012-04-22 ENCOUNTER — Ambulatory Visit (INDEPENDENT_AMBULATORY_CARE_PROVIDER_SITE_OTHER): Payer: BC Managed Care – PPO | Admitting: Cardiology

## 2012-04-22 ENCOUNTER — Encounter: Payer: Self-pay | Admitting: Cardiology

## 2012-04-22 VITALS — BP 136/82 | HR 61 | Ht 75.0 in | Wt 198.0 lb

## 2012-04-22 DIAGNOSIS — I25810 Atherosclerosis of coronary artery bypass graft(s) without angina pectoris: Secondary | ICD-10-CM

## 2012-04-22 DIAGNOSIS — I2510 Atherosclerotic heart disease of native coronary artery without angina pectoris: Secondary | ICD-10-CM

## 2012-04-22 DIAGNOSIS — E782 Mixed hyperlipidemia: Secondary | ICD-10-CM

## 2012-04-22 DIAGNOSIS — E785 Hyperlipidemia, unspecified: Secondary | ICD-10-CM

## 2012-04-22 MED ORDER — ROSUVASTATIN CALCIUM 20 MG PO TABS: 20.0000 mg | ORAL_TABLET | Freq: Every day | ORAL | Status: DC

## 2012-04-22 NOTE — Progress Notes (Signed)
HPI [name] returns today for evaluation and management history of coronary artery disease and mixed hyperlipidemia. He's having no angina or ischemic symptoms. He developed some dyspnea when he goes up significant hills.  The Ranexa has really helped his discomfort. He has not had to use any nitroglycerin.  His biggest complaint is just not feeling well and aches and pains. He is wondering if this Vytorin. Last visit, he was on Crestor but his LDL is still above 100. I do not have any recent blood work. He also is on levothyroxin but hasn't had his thyroid checked lately.  Past Medical History  Diagnosis Date  . Diverticulosis of colon (without mention of hemorrhage)   . Other and unspecified hyperlipidemia   . Benign neoplasm of colon   . Coronary atherosclerosis of unspecified type of vessel, native or graft   . Esophageal reflux   . Unspecified disorder of thyroid   . Blood in stool     Current Outpatient Prescriptions  Medication Sig Dispense Refill  . aspirin 81 MG EC tablet Take 81 mg by mouth daily.        Marland Kitchen ezetimibe-simvastatin (VYTORIN) 10-20 MG per tablet Take 1 tablet by mouth at bedtime.      . fenofibrate (TRICOR) 48 MG tablet Take 48 mg by mouth 2 (two) times daily.        Marland Kitchen levothyroxine (SYNTHROID, LEVOTHROID) 75 MCG tablet Take 75 mcg by mouth daily.      . nitroGLYCERIN (NITROSTAT) 0.4 MG SL tablet Place 0.4 mg under the tongue every 5 (five) minutes as needed.      Marland Kitchen omeprazole (PRILOSEC) 20 MG capsule Take 20 mg by mouth as needed.      . ranolazine (RANEXA) 500 MG 12 hr tablet Take 500 mg by mouth 2 (two) times daily.        . Vitamin D, Ergocalciferol, (DRISDOL) 50000 UNITS CAPS Take 50,000 Units by mouth every 7 (seven) days.       No current facility-administered medications for this visit.    No Known Allergies  Family History  Problem Relation Age of Onset  . Colon cancer Neg Hx     History   Social History  . Marital Status: Widowed    Spouse  Name: N/A    Number of Children: N/A  . Years of Education: N/A   Occupational History  . Not on file.   Social History Main Topics  . Smoking status: Never Smoker   . Smokeless tobacco: Never Used  . Alcohol Use: 0.6 oz/week    1 Cans of beer per week  . Drug Use: No  . Sexually Active: Not on file   Other Topics Concern  . Not on file   Social History Narrative  . No narrative on file    ROS ALL NEGATIVE EXCEPT THOSE NOTED IN HPI  PE  General Appearance: well developed, well nourished in no acute distress HEENT: symmetrical face, PERRLA, good dentition  Neck: no JVD, thyromegaly, or adenopathy, trachea midline Chest: symmetric without deformity Cardiac: PMI non-displaced, RRR, normal S1, S2, no gallop or murmur Lung: clear to ausculation and percussion Vascular: all pulses full without bruits  Abdominal: nondistended, nontender, good bowel sounds, no HSM, no bruits Extremities: no cyanosis, clubbing or edema, no sign of DVT, no varicosities  Skin: normal color, no rashes Neuro: alert and oriented x 3, non-focal Pysch: normal affect  EKG Normal sinus rhythm, nonspecific ST changes, no acute changes.  BMET  Component Value Date/Time   NA 136 01/18/2007 0846   K 4.2 01/18/2007 0846   CL 103 01/18/2007 0846   CO2 27 01/18/2007 0846   GLUCOSE 105* 01/18/2007 0846   BUN 10 01/18/2007 0846   CREATININE 1.3 01/18/2007 0846   CALCIUM 9.3 01/18/2007 0846   GFRNONAA 60 01/18/2007 0846   GFRAA 73 01/18/2007 0846    Lipid Panel     Component Value Date/Time   CHOL 201* 01/18/2007 0846   TRIG 275* 01/18/2007 0846   HDL 34.2* 01/18/2007 0846   CHOLHDL 5.9 CALC 01/18/2007 0846   VLDL 55* 01/18/2007 0846    CBC    Component Value Date/Time   WBC 5.2 01/18/2007 1053   RBC 4.43 01/18/2007 1053   HGB 13.5 01/18/2007 1053   HCT 39.1 01/18/2007 1053   PLT 289 01/18/2007 1053   MCV 88.2 01/18/2007 1053   MCHC 34.6 01/18/2007 1053   RDW 12.7 01/18/2007 1053   MONOABS 0.4 01/18/2007  1053   EOSABS 0.2 01/18/2007 1053   BASOSABS 0.0 01/18/2007 1053

## 2012-04-22 NOTE — Patient Instructions (Addendum)
Your physician recommends that you schedule a follow-up appointment in: ONE YEAR  Your physician has recommended you make the following change in your medication:   1) STOP VYTORIN  2) START CRESTOR 20MG  ONE TABLET EVERY EVENING IN 4 WEEKS IF NO FATIGUE AND JOINT PAIN NOTED   Your physician recommends that you return for lab work in: 10 WEEKS TO CHECK LIVER/LIPIDS/TSH Your physician recommends that you have follow up lab work, we will mail you a reminder letter to alert you when to go [employer], located across the street from our office.

## 2012-04-22 NOTE — Assessment & Plan Note (Signed)
I've asked him to stop Vytorin. After 4 weeks, if he is feeling better, he will start Crestor 20 mg each bedtime. We'll obtain fasting lipids, TSH, and LFTs 6 weeks after starting. Goal LDL is less than 100.

## 2012-04-22 NOTE — Assessment & Plan Note (Signed)
Stable. Ranexa has really helped. No change in medication.

## 2012-05-10 ENCOUNTER — Ambulatory Visit: Payer: BC Managed Care – PPO | Admitting: Cardiology

## 2012-05-14 ENCOUNTER — Other Ambulatory Visit: Payer: Self-pay | Admitting: *Deleted

## 2012-05-14 MED ORDER — LEVOTHYROXINE SODIUM 75 MCG PO TABS: 75.0000 ug | ORAL_TABLET | Freq: Every day | ORAL | Status: DC

## 2012-05-14 NOTE — Telephone Encounter (Signed)
No labs since 8/12. Was told in Oct needed labs

## 2012-06-10 ENCOUNTER — Telehealth: Payer: Self-pay | Admitting: Family Medicine

## 2012-06-10 DIAGNOSIS — E785 Hyperlipidemia, unspecified: Secondary | ICD-10-CM

## 2012-06-10 DIAGNOSIS — E039 Hypothyroidism, unspecified: Secondary | ICD-10-CM

## 2012-06-10 DIAGNOSIS — K219 Gastro-esophageal reflux disease without esophagitis: Secondary | ICD-10-CM

## 2012-06-10 DIAGNOSIS — N400 Enlarged prostate without lower urinary tract symptoms: Secondary | ICD-10-CM

## 2012-06-10 DIAGNOSIS — E559 Vitamin D deficiency, unspecified: Secondary | ICD-10-CM

## 2012-06-10 DIAGNOSIS — I25810 Atherosclerosis of coronary artery bypass graft(s) without angina pectoris: Secondary | ICD-10-CM

## 2012-06-10 NOTE — Telephone Encounter (Signed)
Dr Allen Derry  me know what he needs

## 2012-06-10 NOTE — Telephone Encounter (Signed)
Ok to give order? 

## 2012-07-13 ENCOUNTER — Other Ambulatory Visit: Payer: Self-pay

## 2012-07-13 MED ORDER — LEVOTHYROXINE SODIUM 75 MCG PO TABS: 75.0000 ug | ORAL_TABLET | Freq: Every day | ORAL | Status: DC

## 2012-07-21 ENCOUNTER — Encounter: Payer: Self-pay | Admitting: Family Medicine

## 2012-07-21 ENCOUNTER — Other Ambulatory Visit: Payer: Self-pay | Admitting: *Deleted

## 2012-07-21 ENCOUNTER — Ambulatory Visit (INDEPENDENT_AMBULATORY_CARE_PROVIDER_SITE_OTHER): Payer: BC Managed Care – PPO | Admitting: Family Medicine

## 2012-07-21 VITALS — BP 125/70 | HR 62 | Temp 97.4°F | Ht 74.0 in | Wt 197.8 lb

## 2012-07-21 DIAGNOSIS — N400 Enlarged prostate without lower urinary tract symptoms: Secondary | ICD-10-CM

## 2012-07-21 DIAGNOSIS — R350 Frequency of micturition: Secondary | ICD-10-CM

## 2012-07-21 DIAGNOSIS — E785 Hyperlipidemia, unspecified: Secondary | ICD-10-CM

## 2012-07-21 DIAGNOSIS — E559 Vitamin D deficiency, unspecified: Secondary | ICD-10-CM

## 2012-07-21 DIAGNOSIS — L03316 Cellulitis of umbilicus: Secondary | ICD-10-CM

## 2012-07-21 DIAGNOSIS — Z0000 Encounter for general adult medical examination without abnormal findings: Secondary | ICD-10-CM

## 2012-07-21 DIAGNOSIS — I2510 Atherosclerotic heart disease of native coronary artery without angina pectoris: Secondary | ICD-10-CM

## 2012-07-21 DIAGNOSIS — R140 Abdominal distension (gaseous): Secondary | ICD-10-CM

## 2012-07-21 DIAGNOSIS — I25810 Atherosclerosis of coronary artery bypass graft(s) without angina pectoris: Secondary | ICD-10-CM

## 2012-07-21 DIAGNOSIS — R5383 Other fatigue: Secondary | ICD-10-CM

## 2012-07-21 DIAGNOSIS — K219 Gastro-esophageal reflux disease without esophagitis: Secondary | ICD-10-CM

## 2012-07-21 DIAGNOSIS — E039 Hypothyroidism, unspecified: Secondary | ICD-10-CM

## 2012-07-21 DIAGNOSIS — R198 Other specified symptoms and signs involving the digestive system and abdomen: Secondary | ICD-10-CM

## 2012-07-21 LAB — POCT URINALYSIS DIPSTICK
Nitrite, UA: NEGATIVE
Spec Grav, UA: 1.015
Urobilinogen, UA: NEGATIVE

## 2012-07-21 LAB — THYROID PANEL WITH TSH
T4, Total: 8.3 ug/dL (ref 5.0–12.5)
TSH: 3.705 u[IU]/mL (ref 0.350–4.500)

## 2012-07-21 LAB — HEPATIC FUNCTION PANEL
Bilirubin, Direct: 0.1 mg/dL (ref 0.0–0.3)
Indirect Bilirubin: 0.5 mg/dL (ref 0.0–0.9)

## 2012-07-21 LAB — POCT UA - MICROSCOPIC ONLY
Epithelial cells, urine per micros: NEGATIVE
WBC, Ur, HPF, POC: NEGATIVE
Yeast, UA: NEGATIVE

## 2012-07-21 LAB — POCT CBC
Hemoglobin: 13.6 g/dL — AB (ref 14.1–18.1)
MPV: 8.1 fL (ref 0–99.8)
POC Granulocyte: 7.3 — AB (ref 2–6.9)
POC LYMPH PERCENT: 14.5 %L (ref 10–50)
RBC: 4.7 M/uL (ref 4.69–6.13)

## 2012-07-21 LAB — BASIC METABOLIC PANEL WITH GFR
Chloride: 103 mEq/L (ref 96–112)
Creat: 1.39 mg/dL — ABNORMAL HIGH (ref 0.50–1.35)
GFR, Est Non African American: 54 mL/min — ABNORMAL LOW
Potassium: 4.2 mEq/L (ref 3.5–5.3)

## 2012-07-21 NOTE — Patient Instructions (Addendum)
Fall precautions discussed Continue current meds and therapeutic lifestyle changes Check insurance for approval for a Zostavax shot If swallowing problems continue may need an endoscopy, call us if the symptoms worsen Use peroxide to gently clean naval. Discontinue use of Neosporin. May try Bactroban ointment after cleaning with peroxide.

## 2012-07-21 NOTE — Progress Notes (Signed)
Subjective:    Patient ID: [name]., male    DOB: 01/19/1951, 62 y.o.   MRN: [id number]  HPI Patient returns today for followup of chronic medical problems. He is also here for his yearly checkup.  These include coronary artery disease hyperlipidemia and addition problems with allergies. Labs are being drawn today for followup of these medical problems. As far as health maintenance patient doesn't take flu shots. He is due a Zostavax. Previous x-rays and labs were reviewed.   Review of Systems  Constitutional: Positive for fatigue.  HENT: Positive for sneezing (after mowing) and postnasal drip (slight).   Eyes: Positive for visual disturbance (cloudy, saw Dr Bing Plume < 2 mths ago).  Respiratory: Positive for cough (dry, intermitently). Negative for shortness of breath.   Cardiovascular: Negative.   Gastrointestinal: Positive for abdominal distention (bloating after eating).       Clear leakage from naval  Endocrine: Negative.   Genitourinary: Positive for frequency (at night).  Musculoskeletal: Positive for myalgias (more weakness).  Skin: Negative.   Allergic/Immunologic: Positive for environmental allergies (slight, seasonal).  Psychiatric/Behavioral: Negative.    No ED symptoms. He does not watch his milk cheese ice cream and dairy products closely    Objective:   Physical Exam BP 125/70  Pulse 62  Temp(Src) 97.4 F (36.3 C) (Oral)  Ht 6\' 2"  (1.88 m)  Wt 197 lb 12.8 oz (89.721 kg)  BMI 25.39 kg/m2  The patient appeared well nourished and normally developed, alert and oriented to time and place. Speech, behavior and judgement appear normal. Vital signs as documented.  Head exam is unremarkable. No scleral icterus or pallor noted. Minimal nasal congestion bilaterally. Throat and mouth were normal.  Neck is without jugular venous distension, thyromegally, or carotid bruits. Carotid upstrokes are brisk bilaterally. No cervical adenopathy. Lungs are clear anteriorly and  posteriorly to auscultation. Normal respiratory effort. Cardiac exam reveals regular rate and rhythm at 60 per minute. First and second heart sounds normal.  No murmurs, rubs or gallops. No axillary adenopathy Abdominal exam reveals normal bowl sounds, no masses, no organomegaly and no aortic enlargement. No inguinal adenopathy. The abdomen was somewhat tympanic and especially slightly tender generally. He did have a skin irritation within the naval which appears to be healing over. His prostate was enlarged and smooth without any lumps. There were no rectal masses. There were no inguinal hernia felt. Testicles were normal. Extremities are nonedematous and both femoral and pedal pulses are normal. Skin without pallor or jaundice.  Warm and dry, without rash. Neurologic exam reveals normal deep tendon reflexes and normal sensation.          Assessment & Plan:  1. Vitamin D deficiency - Vitamin D 25 hydroxy; Standing - POCT CBC - Vitamin D 25 hydroxy  2. Hyperlipidemia - Hepatic function panel; Standing - NMR Lipoprofile with Lipids; Standing - Hepatic function panel - NMR Lipoprofile with Lipids - BASIC METABOLIC PANEL WITH GFR  3. CAD (coronary artery disease) - BASIC METABOLIC PANEL WITH GFR; Standing  4. Hypothyroid - Thyroid Panel With TSH - POCT CBC  5. Fatigue - POCT CBC; Standing  6. Urinary frequency - POCT urinalysis dipstick - POCT UA - Microscopic Only - PSA  7. GERD (gastroesophageal reflux disease) - POCT CBC - BASIC METABOLIC PANEL WITH GFR  8. BPH (benign prostatic hypertrophy) - POCT CBC  9. CAD (coronary artery disease) of artery bypass graft - BASIC METABOLIC PANEL WITH GFR  10. Annual physical exam  11. Cellulitis of umbilicus  12. Abdominal fullness and bloating -Abdominal and pelvic ultrasound   Patient Instructions  Fall precautions discussed Continue current meds and therapeutic lifestyle changes Check insurance for approval for a  Zostavax shot If swallowing problems continue may need an endoscopy, call us if the symptoms worsen Use peroxide to gently clean naval. Discontinue use of Neosporin. May try Bactroban ointment after cleaning with peroxide.

## 2012-07-22 ENCOUNTER — Encounter: Payer: Self-pay | Admitting: Family Medicine

## 2012-07-22 LAB — NMR LIPOPROFILE WITH LIPIDS
Cholesterol, Total: 230 mg/dL — ABNORMAL HIGH (ref <200)
HDL Particle Number: 34.1 umol/L (ref >=30.5)
HDL-C: 41 mg/dL (ref >=40)
LDL (calc): 132 mg/dL — ABNORMAL HIGH (ref <100)
LP-IR Score: 81 — ABNORMAL HIGH (ref <=45)
Triglycerides: 284 mg/dL — ABNORMAL HIGH (ref <150)

## 2012-07-22 LAB — VITAMIN D 25 HYDROXY (VIT D DEFICIENCY, FRACTURES): Vit D, 25-Hydroxy: 55 ng/mL (ref 30–89)

## 2012-08-02 ENCOUNTER — Ambulatory Visit (HOSPITAL_COMMUNITY): Payer: BC Managed Care – PPO

## 2012-08-03 ENCOUNTER — Ambulatory Visit (HOSPITAL_COMMUNITY)
Admission: RE | Admit: 2012-08-03 | Discharge: 2012-08-03 | Disposition: A | Discharge status: Home / Self Care | Payer: BC Managed Care – PPO | Source: Ambulatory Visit | Attending: Family Medicine | Admitting: Family Medicine

## 2012-08-03 ENCOUNTER — Telehealth: Payer: Self-pay | Admitting: Family Medicine

## 2012-08-03 DIAGNOSIS — R140 Abdominal distension (gaseous): Secondary | ICD-10-CM

## 2012-08-03 DIAGNOSIS — R109 Unspecified abdominal pain: Secondary | ICD-10-CM | POA: Insufficient documentation

## 2012-08-03 DIAGNOSIS — K5730 Diverticulosis of large intestine without perforation or abscess without bleeding: Secondary | ICD-10-CM | POA: Insufficient documentation

## 2012-08-03 DIAGNOSIS — R198 Other specified symptoms and signs involving the digestive system and abdomen: Secondary | ICD-10-CM

## 2012-08-03 DIAGNOSIS — K7689 Other specified diseases of liver: Secondary | ICD-10-CM | POA: Insufficient documentation

## 2012-08-03 DIAGNOSIS — K449 Diaphragmatic hernia without obstruction or gangrene: Secondary | ICD-10-CM | POA: Insufficient documentation

## 2012-08-03 MED ORDER — IOHEXOL 300 MG/ML  SOLN
100.0000 mL | Freq: Once | INTRAMUSCULAR | Status: AC | PRN
  Administered 2012-08-03: 100 mL via INTRAVENOUS

## 2012-08-03 NOTE — Telephone Encounter (Signed)
LMOM

## 2012-08-04 ENCOUNTER — Ambulatory Visit (INDEPENDENT_AMBULATORY_CARE_PROVIDER_SITE_OTHER): Payer: BC Managed Care – PPO | Admitting: Family Medicine

## 2012-08-04 DIAGNOSIS — I719 Aortic aneurysm of unspecified site, without rupture: Secondary | ICD-10-CM

## 2012-08-04 NOTE — Telephone Encounter (Signed)
Spoke with patient about results.

## 2012-08-07 ENCOUNTER — Other Ambulatory Visit: Payer: Self-pay | Admitting: *Deleted

## 2012-08-07 ENCOUNTER — Encounter: Payer: Self-pay | Admitting: Family Medicine

## 2012-08-07 DIAGNOSIS — I714 Abdominal aortic aneurysm, without rupture: Secondary | ICD-10-CM

## 2012-08-07 NOTE — Patient Instructions (Signed)
Follow-up with Dr. Daleen Squibb and do better with therapeutic lifestyle changes.   Follow-up visit with clinical pharmacist and continue to take medications as directed.

## 2012-08-07 NOTE — Progress Notes (Signed)
Discussed results of abd CT scan with patient in the office and reviewed blood work with patient.  He seemed to understand the results and received copies of both the report and the blood work.  He had coronary artery calcifications.  Patient will follow-up with Dr. Juanito Doom.  He must do better at controlling his cholesterol.  15 to 20 minutes of time was spent discussing the reports and plan of action with the patient.

## 2012-08-09 ENCOUNTER — Other Ambulatory Visit: Payer: Self-pay | Admitting: *Deleted

## 2012-08-09 MED ORDER — VITAMIN D (ERGOCALCIFEROL) 1.25 MG (50000 UNIT) PO CAPS: 50000.0000 [IU] | ORAL_CAPSULE | ORAL | Status: DC

## 2012-08-24 ENCOUNTER — Ambulatory Visit (INDEPENDENT_AMBULATORY_CARE_PROVIDER_SITE_OTHER): Payer: BC Managed Care – PPO | Admitting: Pharmacist Clinician (PhC)/ Clinical Pharmacy Specialist

## 2012-08-24 DIAGNOSIS — E785 Hyperlipidemia, unspecified: Secondary | ICD-10-CM

## 2012-08-24 DIAGNOSIS — I10 Essential (primary) hypertension: Secondary | ICD-10-CM

## 2012-08-24 NOTE — Progress Notes (Signed)
Subjective:    Patient ID: [name]., male    DOB: [birth year], 62 y.o.   MRN: [id number]  Hyperlipidemia This is a chronic problem. The current episode started more than 1 year ago. The problem is uncontrolled. Recent lipid tests were reviewed and are high. Exacerbating diseases include hypothyroidism. He has no history of chronic renal disease, diabetes, liver disease, obesity or nephrotic syndrome. There are no known factors aggravating his hyperlipidemia. Pertinent negatives include no chest pain, focal sensory loss, focal weakness, leg pain, myalgias or shortness of breath. Current antihyperlipidemic treatment includes fibric acid derivatives and statins. The current treatment provides mild improvement of lipids. Compliance problems include adherence to exercise and adherence to diet.  Risk factors for coronary artery disease include dyslipidemia, family history and male sex.      Review of Systems  Constitutional: Negative.   HENT: Negative.   Eyes: Negative.   Respiratory: Negative.  Negative for shortness of breath.   Cardiovascular: Negative.  Negative for chest pain.  Endocrine: Negative.   Musculoskeletal: Negative for myalgias.  Allergic/Immunologic: Negative.   Neurological: Negative for focal weakness.  Hematological: Negative.   Psychiatric/Behavioral: Negative.        Objective:   Physical Exam  Constitutional: He is oriented to person, place, and time. He appears well-developed and well-nourished.  Cardiovascular: Normal rate and regular rhythm.   Neurological: He is alert and oriented to person, place, and time.  Skin: Skin is warm and dry.          Assessment & Plan:   Lipid Clinic Consultation  Chief Complaint:  No chief complaint on file.    Exam Regularity:  RRR Edema:  neg Respirations:  18   Carotid Bruits:  neg Xanthomas:  neg General Appearance:  alert, oriented, no acute distress Mood/Affect:  normal  HPI:  Long standing  hyperlipidemia     Component Value Date/Time   CHOL 201* 01/18/2007 0846   TRIG 284* 07/21/2012 0945   TRIG 275* 01/18/2007 0846   HDL 34.2* 01/18/2007 0846   LDLDIRECT 124.9 01/18/2007 0846   VLDL 55* 01/18/2007 0846   CHOLHDL 5.9 CALC 01/18/2007 0846    Assessment: CHD/CHF Risk Equivalents:  CAD [place] Estd 46yrs risk:  n/a NCEP Risk Factors Present:  HTN, family history and age Primary Problem(s):  LDL or LDL-P elevated  Current NCEP Goals: LDL Goal < 100 HDL Goal >/= 40 Tg Goal < 865 Non-HDL Goal < 130  Secondary cause of hyperlipidemia present:  Treated hypothyroidism. Some IGT and ETOH use Low fat diet followed?  No - some-what low fat diet Low carb diet followed?  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  Also patient is going to stop all alcoholic beverages and that should have a huge impact on decreasing his TG.  Add zetia to crestor until he finished his prescription of crestor than transition over to vytorin 10/20mg . Samples were given of both medications (1/2 tablet of vytorin 10/40mg  since patient is also taking ranexa).  Patient will get a repeat NMR in 6 weeks and BMP.    Recheck Lipid Panel:  6 weeks Other labs needed:  BMP  Time spent counseling patient:  45 min  Physician time spent with patient:    Referring Provider:  Christell Constant   PharmD:  [facility] Pharmacist

## 2012-09-02 ENCOUNTER — Telehealth: Payer: Self-pay | Admitting: Family Medicine

## 2012-09-02 NOTE — Telephone Encounter (Signed)
Patient had some hot flashes and fatigue the last few days and  Patient is concerned that recent medication changes have lead to this.  He wants to restart fenofibrate.   Although I don't think any of the recent changes are related to hot flashes and fatigue.  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  Recommended he drink plenty of fluids and call us if not improved.

## 2012-09-06 ENCOUNTER — Other Ambulatory Visit: Payer: Self-pay

## 2012-09-06 MED ORDER — FENOFIBRATE 48 MG PO TABS: 48.0000 mg | ORAL_TABLET | Freq: Two times a day (BID) | ORAL | Status: DC

## 2012-10-14 ENCOUNTER — Telehealth: Payer: Self-pay | Admitting: Family Medicine

## 2012-10-14 NOTE — Telephone Encounter (Signed)
Per [name]'s note patient should be taking Vytorin 10/20mg  daily.  He is due to recheck labs now.  Samples of Vytorin left at front desk and order for labs was already ordered in computer.  Patient notified

## 2012-10-15 ENCOUNTER — Other Ambulatory Visit (INDEPENDENT_AMBULATORY_CARE_PROVIDER_SITE_OTHER): Payer: BC Managed Care – PPO

## 2012-10-15 DIAGNOSIS — E039 Hypothyroidism, unspecified: Secondary | ICD-10-CM

## 2012-10-15 DIAGNOSIS — K219 Gastro-esophageal reflux disease without esophagitis: Secondary | ICD-10-CM

## 2012-10-15 DIAGNOSIS — I25810 Atherosclerosis of coronary artery bypass graft(s) without angina pectoris: Secondary | ICD-10-CM

## 2012-10-15 DIAGNOSIS — N400 Enlarged prostate without lower urinary tract symptoms: Secondary | ICD-10-CM

## 2012-10-15 DIAGNOSIS — E559 Vitamin D deficiency, unspecified: Secondary | ICD-10-CM

## 2012-10-15 DIAGNOSIS — E785 Hyperlipidemia, unspecified: Secondary | ICD-10-CM

## 2012-10-15 LAB — POCT CBC
HCT, POC: 38.3 % — AB (ref 43.5–53.7)
MCH, POC: 29.7 pg (ref 27–31.2)
MCHC: 34.3 g/dL (ref 31.8–35.4)
POC Granulocyte: 3 (ref 2–6.9)
POC LYMPH PERCENT: 30.4 %L (ref 10–50)
RDW, POC: 13.5 %
WBC: 4.7 10*3/uL (ref 4.6–10.2)

## 2012-10-15 NOTE — Progress Notes (Signed)
Patient came in for labs only.

## 2012-10-16 LAB — BASIC METABOLIC PANEL WITH GFR
BUN: 17 mg/dL (ref 6–23)
CO2: 28 mEq/L (ref 19–32)
Calcium: 9.6 mg/dL (ref 8.4–10.5)
Creat: 1.4 mg/dL — ABNORMAL HIGH (ref 0.50–1.35)
GFR, Est African American: 61 mL/min
Glucose, Bld: 101 mg/dL — ABNORMAL HIGH (ref 70–99)
Sodium: 137 mEq/L (ref 135–145)

## 2012-10-16 LAB — HEPATIC FUNCTION PANEL
AST: 28 U/L (ref 0–37)
Albumin: 4.7 g/dL (ref 3.5–5.2)
Alkaline Phosphatase: 42 U/L (ref 39–117)
Total Bilirubin: 0.6 mg/dL (ref 0.3–1.2)
Total Protein: 7.6 g/dL (ref 6.0–8.3)

## 2012-10-16 LAB — THYROID PANEL WITH TSH: T3 Uptake: 32.1 % (ref 22.5–37.0)

## 2012-10-18 LAB — NMR LIPOPROFILE WITH LIPIDS
HDL Size: 8.3 nm — ABNORMAL LOW (ref >=9.2)
LDL Particle Number: 2236 nmol/L — ABNORMAL HIGH (ref <1000)
LDL Size: 19.8 nm — ABNORMAL LOW (ref >20.5)
Large HDL-P: <1.3 umol/L — ABNORMAL LOW (ref >=4.8)
Large VLDL-P: 7.9 nmol/L — ABNORMAL HIGH (ref <=2.7)
Small LDL Particle Number: 1769 nmol/L — ABNORMAL HIGH (ref <=527)
Triglycerides: 241 mg/dL — ABNORMAL HIGH (ref <150)
VLDL Size: 47.4 nm — ABNORMAL HIGH (ref <=46.6)

## 2012-10-19 ENCOUNTER — Other Ambulatory Visit (INDEPENDENT_AMBULATORY_CARE_PROVIDER_SITE_OTHER): Payer: BC Managed Care – PPO

## 2012-10-19 DIAGNOSIS — Z1212 Encounter for screening for malignant neoplasm of rectum: Secondary | ICD-10-CM

## 2012-10-19 NOTE — Progress Notes (Signed)
Patient dropped off fobt 

## 2012-10-21 ENCOUNTER — Other Ambulatory Visit: Payer: Self-pay | Admitting: *Deleted

## 2012-10-21 LAB — SPECIMEN STATUS REPORT

## 2012-10-21 MED ORDER — OMEPRAZOLE MAGNESIUM 20 MG PO TBEC: DELAYED_RELEASE_TABLET | ORAL | Status: DC

## 2012-10-21 NOTE — Telephone Encounter (Signed)
EPIC HAS 20MG  QD. REFILL REQUEST HAS 20MG  OTC - 2 CAPSULES QD. THANKS.

## 2012-11-09 ENCOUNTER — Other Ambulatory Visit: Payer: Self-pay

## 2012-11-09 MED ORDER — RANOLAZINE ER 500 MG PO TB12: 500.0000 mg | ORAL_TABLET | Freq: Two times a day (BID) | ORAL | Status: DC

## 2013-02-01 ENCOUNTER — Other Ambulatory Visit: Payer: Self-pay

## 2013-02-01 MED ORDER — EZETIMIBE-SIMVASTATIN 10-20 MG PO TABS: 1.0000 | ORAL_TABLET | Freq: Every day | ORAL | Status: DC

## 2013-02-01 NOTE — Telephone Encounter (Signed)
Last seen 08/04/12  DWM  Last lipids 10/15/12  This med not on EPIC list

## 2013-03-03 ENCOUNTER — Telehealth: Payer: Self-pay | Admitting: Family Medicine

## 2013-03-03 NOTE — Telephone Encounter (Signed)
Patient's insurance has been stopped.  He is very surprised.  He doesn't have any vytorin.  I didn't have any samples of vytorin but I did have zetia 10mg  and Livalo 2mg  - left samples for 1 month at front desk as well as samples for Antera 90mg  samples for 2 weeks.

## 2013-03-10 ENCOUNTER — Telehealth: Payer: Self-pay | Admitting: Family Medicine

## 2013-03-18 ENCOUNTER — Telehealth: Payer: Self-pay | Admitting: Cardiology

## 2013-03-18 NOTE — Telephone Encounter (Signed)
Would like to know if there is a substitution for Ranexa due to him not having insurance right now / tgs

## 2013-03-18 NOTE — Telephone Encounter (Signed)
Pt of Dr Verl Blalock has not been set up with provider yet. Pt cant afford to come in right now for an appointment. Called Denyse Amass from [place] she is out of office and will be back in on Monday 03-21-13.  Gave pt her number to call just in case. [phone]

## 2013-04-25 ENCOUNTER — Other Ambulatory Visit: Payer: Self-pay | Admitting: Family Medicine

## 2013-04-27 NOTE — Telephone Encounter (Signed)
You may refill this but patient needs to get a vitamin D level

## 2013-04-27 NOTE — Telephone Encounter (Signed)
Last vit D was 9/14. Does he need repeat vit d level before refilling RX?

## 2013-05-05 ENCOUNTER — Other Ambulatory Visit: Payer: Self-pay | Admitting: Cardiology

## 2013-06-06 ENCOUNTER — Other Ambulatory Visit: Payer: Self-pay | Admitting: Family Medicine

## 2013-06-07 NOTE — Telephone Encounter (Signed)
Last seen 08/04/12  DWM

## 2013-06-24 ENCOUNTER — Encounter: Payer: Self-pay | Admitting: Family Medicine

## 2013-06-24 ENCOUNTER — Ambulatory Visit (INDEPENDENT_AMBULATORY_CARE_PROVIDER_SITE_OTHER): Payer: 59 | Admitting: Family Medicine

## 2013-06-24 VITALS — BP 142/86 | HR 62 | Temp 98.7°F | Ht 74.0 in | Wt 200.0 lb

## 2013-06-24 DIAGNOSIS — R5381 Other malaise: Secondary | ICD-10-CM

## 2013-06-24 DIAGNOSIS — M2550 Pain in unspecified joint: Secondary | ICD-10-CM

## 2013-06-24 DIAGNOSIS — R509 Fever, unspecified: Secondary | ICD-10-CM

## 2013-06-24 DIAGNOSIS — R5383 Other fatigue: Secondary | ICD-10-CM

## 2013-06-24 LAB — POCT CBC
Granulocyte percent: 71.7 %G (ref 37–80)
HCT, POC: 44.6 % (ref 43.5–53.7)
Hemoglobin: 14.3 g/dL (ref 14.1–18.1)
Lymph, poc: 1.7 (ref 0.6–3.4)
MCH, POC: 28.1 pg (ref 27–31.2)
MCHC: 32.1 g/dL (ref 31.8–35.4)
MCV: 87.6 fL (ref 80–97)
MPV: 7.8 fL (ref 0–99.8)
POC Granulocyte: 4.8 (ref 2–6.9)
POC LYMPH PERCENT: 25.8 %L (ref 10–50)
Platelet Count, POC: 263 10*3/uL (ref 142–424)
RBC: 5.1 M/uL (ref 4.69–6.13)
RDW, POC: 13.9 %
WBC: 6.7 10*3/uL (ref 4.6–10.2)

## 2013-06-24 NOTE — Progress Notes (Signed)
   Subjective:    Patient ID: Corey Ochs., male    DOB: 03-09-1950, 63 y.o.   MRN: [id number]  HPI This 63 y.o. male presents for evaluation of tick bite.  He was bitten by a tick a few days ago and had erythema and it has resolved.   Review of Systems C/o tick bite   No chest pain, SOB, HA, dizziness, vision change, N/V, diarrhea, constipation, dysuria, urinary urgency or frequency, myalgias, arthralgias or rash.  Objective:   Physical Exam Vital signs noted  Well developed well nourished male.  HEENT - Head atraumatic Normocephalic Respiratory - Lungs CTA bilateral Cardiac - RRR S1 and S2 without murmur Skin - No rash or erythema seen       Assessment & Plan:  Fatigue - Plan: LYME IGG/IGM AB, [place] spotted fvr abs pnl(IgG+IgM), POCT CBC  Joint pain - Plan: LYME IGG/IGM AB, [place] spotted fvr abs pnl(IgG+IgM), POCT CBC  Fever - Plan: LYME IGG/IGM AB, [place] spotted fvr abs pnl(IgG+IgM), POCT CBC  Tick bite - Check lymes and RMSF titres  Lysbeth Penner FNP

## 2013-06-29 LAB — ROCKY MTN SPOTTED FVR ABS PNL(IGG+IGM)
RMSF IgG: NEGATIVE
RMSF IgM: 0.42 index (ref 0.00–0.89)

## 2013-06-29 LAB — LYME, TOTAL AB TEST/REFLEX: Lyme IgG/IgM Ab: <0.91 {ISR} (ref 0.00–0.90)

## 2013-07-02 ENCOUNTER — Telehealth: Payer: Self-pay | Admitting: Family Medicine

## 2013-07-05 ENCOUNTER — Telehealth: Payer: Self-pay | Admitting: Family Medicine

## 2013-07-05 NOTE — Telephone Encounter (Signed)
Pt aware of labs.  rs °

## 2013-07-05 NOTE — Telephone Encounter (Signed)
Please review last labs for patient.

## 2013-07-05 NOTE — Telephone Encounter (Signed)
Message copied by Waverly Ferrari on Tue Jul 05, 2013 11:55 AM ------      Message from: Lysbeth Penner      Created: Tue Jul 05, 2013  9:58 AM       Labs normal and rmsf and lymes titres neg ------

## 2013-07-06 NOTE — Telephone Encounter (Signed)
Pt aware of normal lab results.  rs

## 2013-07-06 NOTE — Telephone Encounter (Signed)
Labs were normal and they were commented on 07/05/13 at [phone]

## 2013-07-07 ENCOUNTER — Telehealth: Payer: Self-pay | Admitting: *Deleted

## 2013-07-07 NOTE — Telephone Encounter (Signed)
Labs normal. Negative for RMSF and Lymes disease.

## 2013-07-07 NOTE — Telephone Encounter (Signed)
Message copied by Shelbie Ammons on Thu Jul 07, 2013 11:02 AM ------      Message from: Lysbeth Penner      Created: Tue Jul 05, 2013  9:58 AM       Labs normal and rmsf and lymes titres neg ------

## 2013-07-08 ENCOUNTER — Other Ambulatory Visit: Payer: Self-pay | Admitting: Family Medicine

## 2013-07-11 NOTE — Telephone Encounter (Signed)
Last seen 06/24/13  B [place]  Last thyroid and lipid tests 10/15/12

## 2013-08-06 ENCOUNTER — Other Ambulatory Visit: Payer: Self-pay | Admitting: Family Medicine

## 2013-09-01 ENCOUNTER — Encounter: Payer: Self-pay | Admitting: Gastroenterology

## 2013-09-06 ENCOUNTER — Other Ambulatory Visit: Payer: Self-pay | Admitting: Family Medicine

## 2013-09-07 NOTE — Telephone Encounter (Signed)
Last seen 06/24/13  B [place]  Last lipid 10/15/12

## 2013-09-16 ENCOUNTER — Encounter: Payer: Self-pay | Admitting: Gastroenterology

## 2013-09-29 ENCOUNTER — Other Ambulatory Visit: Payer: Self-pay | Admitting: Family Medicine

## 2013-09-30 NOTE — Telephone Encounter (Signed)
Sensitive has almost been 1 year, we will be unable to refill the medication beyond one month

## 2013-09-30 NOTE — Telephone Encounter (Signed)
Patients last lipids done 10-2012. Was to return in 6 weeks for recheck labs. Please advise on refill

## 2013-10-31 ENCOUNTER — Other Ambulatory Visit: Payer: Self-pay | Admitting: Family Medicine

## 2013-11-08 ENCOUNTER — Other Ambulatory Visit: Payer: Self-pay | Admitting: Family Medicine

## 2013-11-09 NOTE — Telephone Encounter (Signed)
Last lipid/liver 10/2012, no appt is scheduled

## 2013-11-30 ENCOUNTER — Other Ambulatory Visit: Payer: Self-pay | Admitting: Family Medicine

## 2013-12-15 ENCOUNTER — Telehealth: Payer: Self-pay | Admitting: Family Medicine

## 2013-12-15 MED ORDER — AZITHROMYCIN 250 MG PO TABS: ORAL_TABLET | ORAL | Status: DC

## 2013-12-15 NOTE — Telephone Encounter (Signed)
Med sent to pharm and pt aware 

## 2013-12-15 NOTE — Telephone Encounter (Signed)
Please call a prescription in for a Z-Pak and have patient use saline nose spray

## 2013-12-20 ENCOUNTER — Other Ambulatory Visit: Payer: Self-pay | Admitting: Family Medicine

## 2013-12-20 NOTE — Telephone Encounter (Signed)
This was just filled on 12/15/13.

## 2013-12-20 NOTE — Telephone Encounter (Signed)
The medicine that was filled on 11- 5 is still working for 10 days therefore unable to refill this prescription

## 2013-12-21 ENCOUNTER — Other Ambulatory Visit: Payer: Self-pay | Admitting: Family Medicine

## 2013-12-22 ENCOUNTER — Telehealth: Payer: Self-pay | Admitting: *Deleted

## 2013-12-22 NOTE — Telephone Encounter (Signed)
Left message, script at pharmacy but will need to schedule an appointment if no improvement.

## 2013-12-22 NOTE — Telephone Encounter (Signed)
This patient really needs to be seen

## 2013-12-26 ENCOUNTER — Other Ambulatory Visit: Payer: Self-pay | Admitting: Family Medicine

## 2014-02-08 ENCOUNTER — Other Ambulatory Visit: Payer: Self-pay | Admitting: *Deleted

## 2014-02-08 NOTE — Telephone Encounter (Signed)
We're unable to refill this until the patient is seen in gets a vitamin D level. The patient's last vitamin D level was in September 2014

## 2014-02-08 NOTE — Telephone Encounter (Signed)
No vit. D level since 10/2012

## 2014-04-06 ENCOUNTER — Other Ambulatory Visit: Payer: Self-pay | Admitting: Family Medicine

## 2014-04-07 NOTE — Telephone Encounter (Signed)
Last TSH was 10/2012

## 2014-04-07 NOTE — Telephone Encounter (Signed)
You can give this patient 2 weeks worth of medicine so he will have time to come in and be seen and get lab work please schedule an appointment with a provider like Dr. Sabra Heck that can see him soon.

## 2014-05-01 ENCOUNTER — Other Ambulatory Visit: Payer: Self-pay | Admitting: Family Medicine

## 2014-05-01 NOTE — Telephone Encounter (Signed)
We are unable to refill this as he has not had a thyroid profile and almost 2 years

## 2014-05-01 NOTE — Telephone Encounter (Signed)
Last thyroid labs were 10/2012

## 2014-05-18 ENCOUNTER — Other Ambulatory Visit: Payer: Self-pay | Admitting: Family Medicine

## 2014-05-19 ENCOUNTER — Other Ambulatory Visit: Payer: Self-pay | Admitting: Family Medicine

## 2014-05-19 MED ORDER — ROSUVASTATIN CALCIUM 20 MG PO TABS: ORAL_TABLET | ORAL | Status: DC

## 2014-05-19 MED ORDER — RANOLAZINE ER 500 MG PO TB12: 500.0000 mg | ORAL_TABLET | Freq: Two times a day (BID) | ORAL | Status: DC

## 2014-05-19 NOTE — Telephone Encounter (Signed)
Appt scheduled for 4/28, will need TSH done at that time

## 2014-05-19 NOTE — Telephone Encounter (Signed)
done

## 2014-06-02 ENCOUNTER — Other Ambulatory Visit: Payer: Self-pay | Admitting: Family Medicine

## 2014-06-08 ENCOUNTER — Ambulatory Visit (INDEPENDENT_AMBULATORY_CARE_PROVIDER_SITE_OTHER): Payer: Medicare HMO

## 2014-06-08 ENCOUNTER — Encounter: Payer: Self-pay | Admitting: Family Medicine

## 2014-06-08 ENCOUNTER — Ambulatory Visit (INDEPENDENT_AMBULATORY_CARE_PROVIDER_SITE_OTHER): Payer: Medicare HMO | Admitting: Family Medicine

## 2014-06-08 VITALS — BP 133/75 | HR 61 | Temp 98.4°F | Ht 74.0 in | Wt 202.0 lb

## 2014-06-08 DIAGNOSIS — E785 Hyperlipidemia, unspecified: Secondary | ICD-10-CM

## 2014-06-08 DIAGNOSIS — E039 Hypothyroidism, unspecified: Secondary | ICD-10-CM | POA: Diagnosis not present

## 2014-06-08 DIAGNOSIS — E559 Vitamin D deficiency, unspecified: Secondary | ICD-10-CM

## 2014-06-08 DIAGNOSIS — I2510 Atherosclerotic heart disease of native coronary artery without angina pectoris: Secondary | ICD-10-CM

## 2014-06-08 DIAGNOSIS — K219 Gastro-esophageal reflux disease without esophagitis: Secondary | ICD-10-CM

## 2014-06-08 DIAGNOSIS — N400 Enlarged prostate without lower urinary tract symptoms: Secondary | ICD-10-CM

## 2014-06-08 LAB — POCT URINALYSIS DIPSTICK
Bilirubin, UA: NEGATIVE
Blood, UA: NEGATIVE
Glucose, UA: NEGATIVE
Ketones, UA: NEGATIVE
LEUKOCYTES UA: NEGATIVE
Nitrite, UA: NEGATIVE
PH UA: 6
PROTEIN UA: NEGATIVE
Spec Grav, UA: 1.02
UROBILINOGEN UA: NEGATIVE

## 2014-06-08 LAB — POCT UA - MICROSCOPIC ONLY
CASTS, UR, LPF, POC: NEGATIVE
CRYSTALS, UR, HPF, POC: NEGATIVE
YEAST UA: NEGATIVE

## 2014-06-08 LAB — POCT CBC
Granulocyte percent: 61.8 %G (ref 37–80)
HEMATOCRIT: 43.5 % (ref 43.5–53.7)
HEMOGLOBIN: 13.6 g/dL — AB (ref 14.1–18.1)
Lymph, poc: 1.9 (ref 0.6–3.4)
MCH: 27.2 pg (ref 27–31.2)
MCHC: 31.3 g/dL — AB (ref 31.8–35.4)
MCV: 87.1 fL (ref 80–97)
MPV: 7.8 fL (ref 0–99.8)
POC Granulocyte: 3.8 (ref 2–6.9)
POC LYMPH PERCENT: 31.2 %L (ref 10–50)
Platelet Count, POC: 268 10*3/uL (ref 142–424)
RBC: 5 M/uL (ref 4.69–6.13)
RDW, POC: 14.4 %
WBC: 6.1 10*3/uL (ref 4.6–10.2)

## 2014-06-08 MED ORDER — SYNTHROID 75 MCG PO TABS: ORAL_TABLET | ORAL | Status: DC

## 2014-06-08 MED ORDER — OMEPRAZOLE MAGNESIUM 20 MG PO TBEC: DELAYED_RELEASE_TABLET | ORAL | Status: DC

## 2014-06-08 MED ORDER — ROSUVASTATIN CALCIUM 20 MG PO TABS: ORAL_TABLET | ORAL | Status: DC

## 2014-06-08 MED ORDER — VITAMIN D (ERGOCALCIFEROL) 1.25 MG (50000 UNIT) PO CAPS: ORAL_CAPSULE | ORAL | Status: DC

## 2014-06-08 NOTE — Progress Notes (Signed)
Subjective:    Patient ID: Corey Ochs., male    DOB: 1951/01/22, 64 y.o.   MRN: [id number]  HPI Pt here for follow up and management of chronic medical problems which includes hypertension, hyperlipidemia, and GERD. He is taking medications regularly. The patient has no specific complaints and is here primarily to have follow-up of his underlying problems so that he can continue to get his medication. He is also due to have his prostate checked return an FOBT get a chest x-ray and have lab work done. All of his medication will be refilled. The patient has had some problems with his insurance. He has not seen the cardiologist in several years. His main complaints today are he does have occasional heartburn related to food intake. He denies chest pain or shortness of breath. He denies any other GI symptoms. He is not having any trouble passing his water.       Patient Active Problem List   Diagnosis Date Noted  . Diverticulosis of colon (without mention of hemorrhage)   . Other and unspecified hyperlipidemia   . Benign neoplasm of colon   . COLONIC POLYPS 06/12/2007  . UNSPECIFIED DISORDER OF THYROID 06/12/2007  . HYPERLIPIDEMIA 06/12/2007  . CAD 06/12/2007  . GERD 06/12/2007  . DIVERTICULOSIS, COLON 06/12/2007  . GUAIAC POSITIVE STOOL 06/12/2007   Outpatient Encounter Prescriptions as of 06/08/2014  Medication Sig  . aspirin 81 MG EC tablet Take 81 mg by mouth daily.    Marland Kitchen omeprazole (PRILOSEC OTC) 20 MG tablet TAKE TWO CAPSULES EACH DAY  . ranolazine (RANEXA) 500 MG 12 hr tablet Take 1 tablet (500 mg total) by mouth 2 (two) times daily.  . rosuvastatin (CRESTOR) 20 MG tablet TAKE ONE (1) TABLET EACH DAY  . SYNTHROID 75 MCG tablet TAKE ONE (1) TABLET EACH DAY  . Vitamin D, Ergocalciferol, (DRISDOL) 50000 UNITS CAPS capsule TAKE 1 CAPSULE EVERY WEEK  . nitroGLYCERIN (NITROSTAT) 0.4 MG SL tablet Place 0.4 mg under the tongue every 5 (five) minutes as needed.  . [DISCONTINUED]  azithromycin (ZITHROMAX) 250 MG tablet TAKE 2 TABLETS NOW THEN TAKE 1 TABLET DAILY FOR THE NEXT 4 DAYS     Review of Systems  Constitutional: Negative.   HENT: Negative.   Eyes: Negative.   Respiratory: Negative.   Cardiovascular: Negative.   Gastrointestinal: Negative.   Endocrine: Negative.   Genitourinary: Negative.   Musculoskeletal: Negative.   Skin: Negative.   Allergic/Immunologic: Negative.   Neurological: Negative.   Hematological: Negative.   Psychiatric/Behavioral: Negative.        Objective:   Physical Exam  Constitutional: He is oriented to person, place, and time. He appears well-developed and well-nourished. No distress.  HENT:  Head: Normocephalic and atraumatic.  Right Ear: External ear normal.  Left Ear: External ear normal.  Nose: Nose normal.  Mouth/Throat: Oropharynx is clear and moist. No oropharyngeal exudate.  Eyes: Conjunctivae and EOM are normal. Pupils are equal, round, and reactive to light. Right eye exhibits no discharge. Left eye exhibits no discharge. No scleral icterus.  Neck: Normal range of motion. Neck supple. No thyromegaly present.  The neck is without carotid bruits or thyromegaly or adenopathy.  Cardiovascular: Normal rate, regular rhythm, normal heart sounds and intact distal pulses.   No murmur heard. Heart has a regular rate and rhythm at 72/m.  Pulmonary/Chest: Effort normal and breath sounds normal. No respiratory distress. He has no wheezes. He has no rales. He exhibits no tenderness.  The axillary  regions are negative for adenopathy  Abdominal: Soft. Bowel sounds are normal. He exhibits no mass. There is no tenderness. There is no rebound and no guarding.  The abdomen is soft without masses tenderness or organ enlargement or bruits  Genitourinary: Rectum normal and penis normal. No penile tenderness.  The prostate is enlarged but soft and smooth. The rectal exam was without masses. The inguinal areas were negative for  adenopathy. There is no hernia palpated. The external genitalia were within normal limits.  Musculoskeletal: Normal range of motion. He exhibits no edema or tenderness.  Lymphadenopathy:    He has no cervical adenopathy.  Neurological: He is alert and oriented to person, place, and time. He has normal reflexes. No cranial nerve deficit.  Skin: Skin is warm and dry. No rash noted. No erythema. No pallor.  Psychiatric: He has a normal mood and affect. His behavior is normal. Judgment and thought content normal.  Nursing note and vitals reviewed.  BP 133/75 mmHg  Pulse 61  Temp(Src) 98.4 F (36.9 C) (Oral)  Ht 6' 2"  (1.88 m)  Wt 202 lb (91.627 kg)  BMI 25.92 kg/m2   [facility] reading (PRIMARY) by  Dr. Brunilda Payor x-ray- I was not able to visualize the chest x-ray result before the machine was shut down.                                      Assessment & Plan:  1. Vitamin D deficiency -The patient should continue with his current treatment pending results of lab work being done today. - POCT CBC - Vit D  25 hydroxy (rtn osteoporosis monitoring)  2. Hyperlipidemia -The patient should continue with aggressive therapeutic lifestyle changes as well as his current Crestor treatment and any further changes will be secondary to lab work which is returned in the future. - POCT CBC - BMP8+EGFR - Hepatic function panel - NMR, lipoprofile - DG Chest 2 View; Future  3. BPH (benign prostatic hypertrophy) -The patient still has an enlarged prostate but is not having any symptoms associated with this. - POCT CBC - PSA, total and free - POCT UA - Microscopic Only - POCT urinalysis dipstick  4. Gastroesophageal reflux disease, esophagitis presence not specified -The patient continues to take omeprazole and has only occasional indigestion related to certain foods. - POCT CBC - Hepatic function panel  5. Hypothyroidism, unspecified hypothyroidism type -He should continue his current treatment  pending results of lab work - POCT CBC - Thyroid Panel With TSH  6. Coronary artery disease involving native coronary artery of native heart without angina pectoris -We will do a referral back to his cardiologist for further follow-up - DG Chest 2 View; Future - Ambulatory referral to Cardiology  Meds ordered this encounter  Medications  . omeprazole (PRILOSEC OTC) 20 MG tablet    Sig: TAKE TWO CAPSULES EACH DAY    Dispense:  60 tablet    Refill:  6  . rosuvastatin (CRESTOR) 20 MG tablet    Sig: TAKE ONE (1) TABLET EACH DAY    Dispense:  30 tablet    Refill:  6  . SYNTHROID 75 MCG tablet    Sig: TAKE ONE (1) TABLET EACH DAY    Dispense:  30 tablet    Refill:  6  . Vitamin D, Ergocalciferol, (DRISDOL) 50000 UNITS CAPS capsule    Sig: TAKE 1 CAPSULE EVERY WEEK  Dispense:  12 capsule    Refill:  1   Patient Instructions                       Medicare Annual Wellness Visit  [facility] and the medical providers at [facility] strive to bring you the best medical care.  In doing so we not only want to address your current medical conditions and concerns but also to detect new conditions early and prevent illness, disease and health-related problems.    Medicare offers a yearly Wellness Visit which allows our clinical staff to assess your need for preventative services including immunizations, lifestyle education, counseling to decrease risk of preventable diseases and screening for fall risk and other medical concerns.    This visit is provided free of charge (no copay) for all Medicare recipients. The clinical pharmacists at [facility] have begun to conduct these Wellness Visits which will also include a thorough review of all your medications.    As you primary medical provider recommend that you make an appointment for your Annual Wellness Visit if you have not done so already this year.  You may set up this appointment before you  leave today or you may call back [phone]) and schedule an appointment.  Please make sure when you call that you mention that you are scheduling your Annual Wellness Visit with the clinical pharmacist so that the appointment may be made for the proper length of time.    Continue current medications. Continue good therapeutic lifestyle changes which include good diet and exercise. Fall precautions discussed with patient. If an FOBT was given today- please return it to our front desk. If you are over 34 years old - you may need Prevnar 83 or the adult Pneumonia vaccine.  Flu Shots are still available at our office. If you still haven't had one please call to set up a nurse visit to get one.   After your visit with [place] today you will receive a survey in the mail or online from [employer] regarding your care with [place]. Please take a moment to fill this out. Your feedback is very important to [place] as you can help [place] better understand your patient needs as well as improve your experience and satisfaction. WE CARE ABOUT YOU!!!   It is important that you continue to take her medicine regularly and get plenty of exercise We will arrange for you to have a follow-up appointment with the cardiologist Stay as active as possible and drink plenty of fluids Try to avoid spicy foods and continue with omeprazole   Arrie Senate MD

## 2014-06-08 NOTE — Patient Instructions (Addendum)
Medicare Annual Wellness Visit  [facility] and the medical providers at [facility] strive to bring you the best medical care.  In doing so we not only want to address your current medical conditions and concerns but also to detect new conditions early and prevent illness, disease and health-related problems.    Medicare offers a yearly Wellness Visit which allows our clinical staff to assess your need for preventative services including immunizations, lifestyle education, counseling to decrease risk of preventable diseases and screening for fall risk and other medical concerns.    This visit is provided free of charge (no copay) for all Medicare recipients. The clinical pharmacists at [facility] have begun to conduct these Wellness Visits which will also include a thorough review of all your medications.    As you primary medical provider recommend that you make an appointment for your Annual Wellness Visit if you have not done so already this year.  You may set up this appointment before you leave today or you may call back ([phone]) and schedule an appointment.  Please make sure when you call that you mention that you are scheduling your Annual Wellness Visit with the clinical pharmacist so that the appointment may be made for the proper length of time.    Continue current medications. Continue good therapeutic lifestyle changes which include good diet and exercise. Fall precautions discussed with patient. If an FOBT was given today- please return it to our front desk. If you are over 82 years old - you may need Prevnar 29 or the adult Pneumonia vaccine.  Flu Shots are still available at our office. If you still haven't had one please call to set up a nurse visit to get one.   After your visit with [place] today you will receive a survey in the mail or online from [employer] regarding your care with [place]. Please take a moment to  fill this out. Your feedback is very important to [place] as you can help [place] better understand your patient needs as well as improve your experience and satisfaction. WE CARE ABOUT YOU!!!   It is important that you continue to take her medicine regularly and get plenty of exercise We will arrange for you to have a follow-up appointment with the cardiologist Stay as active as possible and drink plenty of fluids Try to avoid spicy foods and continue with omeprazole

## 2014-06-09 LAB — PSA, TOTAL AND FREE
PROSTATE SPECIFIC AG, SERUM: 3.1 ng/mL (ref 0.0–4.0)
PSA, Free Pct: 15.2 %
PSA, Free: 0.47 ng/mL

## 2014-06-09 LAB — BMP8+EGFR
BUN / CREAT RATIO: 13 (ref 10–22)
BUN: 13 mg/dL (ref 8–27)
CO2: 26 mmol/L (ref 18–29)
CREATININE: 1.02 mg/dL (ref 0.76–1.27)
Calcium: 9.4 mg/dL (ref 8.6–10.2)
Chloride: 100 mmol/L (ref 97–108)
GFR calc Af Amer: 89 mL/min/{1.73_m2} (ref >59)
GFR calc non Af Amer: 77 mL/min/{1.73_m2} (ref >59)
Glucose: 87 mg/dL (ref 65–99)
POTASSIUM: 4.1 mmol/L (ref 3.5–5.2)
SODIUM: 141 mmol/L (ref 134–144)

## 2014-06-09 LAB — HEPATIC FUNCTION PANEL
ALT: 21 IU/L (ref 0–44)
AST: 23 IU/L (ref 0–40)
Albumin: 4.9 g/dL — ABNORMAL HIGH (ref 3.6–4.8)
Alkaline Phosphatase: 70 IU/L (ref 39–117)
Bilirubin Total: 0.5 mg/dL (ref 0.0–1.2)
Bilirubin, Direct: 0.13 mg/dL (ref 0.00–0.40)
Total Protein: 7.7 g/dL (ref 6.0–8.5)

## 2014-06-09 LAB — NMR, LIPOPROFILE
CHOLESTEROL: 240 mg/dL — AB (ref 100–199)
HDL CHOLESTEROL BY NMR: 43 mg/dL (ref >39)
HDL PARTICLE NUMBER: 38.9 umol/L (ref >=30.5)
LDL Particle Number: 987 nmol/L (ref <1000)
LDL SIZE: 19.5 nm (ref >20.5)
LP-IR Score: 65 — ABNORMAL HIGH (ref <=45)
Small LDL Particle Number: 796 nmol/L — ABNORMAL HIGH (ref <=527)
Triglycerides by NMR: 590 mg/dL (ref 0–149)

## 2014-06-09 LAB — THYROID PANEL WITH TSH
Free Thyroxine Index: 2 (ref 1.2–4.9)
T3 Uptake Ratio: 28 % (ref 24–39)
T4, Total: 7.3 ug/dL (ref 4.5–12.0)
TSH: 4.89 u[IU]/mL — ABNORMAL HIGH (ref 0.450–4.500)

## 2014-06-09 LAB — VITAMIN D 25 HYDROXY (VIT D DEFICIENCY, FRACTURES): Vit D, 25-Hydroxy: 33 ng/mL (ref 30.0–100.0)

## 2014-06-14 ENCOUNTER — Telehealth: Payer: Self-pay | Admitting: Family Medicine

## 2014-06-14 NOTE — Telephone Encounter (Signed)
[name] is trying to get this moved up With Dr Aundra Dubin

## 2014-06-16 ENCOUNTER — Telehealth: Payer: Self-pay | Admitting: Family Medicine

## 2014-06-16 NOTE — Telephone Encounter (Signed)
Patient called to office and was informed concerning Dr Oleh Genin schedule. Pt stated he would call Dr Tawanna Sat office and call us back if he decided to schedule. Pt will be taken off Workqueue List and if pt decides to schedule another referral would need to be entered in for pt.   Thanks [place] Patient Care Coordinator [facility]

## 2014-06-16 NOTE — Telephone Encounter (Signed)
Please schedule patient to see Dr. Percival Spanish when he comes to [facility]

## 2014-07-06 NOTE — Telephone Encounter (Signed)
Please schedule patient to see the cardiologist when he comes to [facility]

## 2014-07-06 NOTE — Telephone Encounter (Signed)
Has an appt with Dr Percival Spanish for 08/16/14 at 1:00 According to referral note a letter was sent to the patient with this information

## 2014-08-03 ENCOUNTER — Other Ambulatory Visit: Payer: Self-pay | Admitting: Family Medicine

## 2014-08-16 ENCOUNTER — Ambulatory Visit (INDEPENDENT_AMBULATORY_CARE_PROVIDER_SITE_OTHER): Payer: Medicare HMO | Admitting: Cardiology

## 2014-08-16 ENCOUNTER — Encounter: Payer: Self-pay | Admitting: Cardiology

## 2014-08-16 VITALS — BP 120/81 | HR 59 | Ht 75.0 in | Wt 200.0 lb

## 2014-08-16 DIAGNOSIS — I25111 Atherosclerotic heart disease of native coronary artery with angina pectoris with documented spasm: Secondary | ICD-10-CM

## 2014-08-16 NOTE — Patient Instructions (Addendum)
Medication Instructions:  Your physician recommends that you continue on your current medications as directed. Please refer to the Current Medication list given to you today.\  Follow-Up: Follow up in 6 months with Dr. Percival Spanish in [place].  You will receive a letter in the mail 2 months before you are due.  Please call us when you receive this letter to schedule your follow up appointment.  Thank you for choosing [facility]!!

## 2014-08-16 NOTE — Progress Notes (Signed)
Cardiology Office Note   Date:  08/16/2014   ID:  Corey Ochs., DOB [birth year], MRN [id number]  PCP:  Redge Gainer, MD  Cardiologist:   Minus Breeding, MD   Chief Complaint  Patient presents with  . Coronary Artery Disease      History of Present Illness: [name]. is a 64 y.o. male who presents for follow up of CAD.  The patient is new to me although he saw Dr. Catha Gosselin couple of years ago. He's had coronary disease with catheterizations in 2006 and 2008 demonstrating an occluded right coronary artery. He had nonobstructive disease in a diagonal. He was managed medically. His only symptom in 2006 was some dyspnea. However, abnormal stress test leading to catheterization. He never tolerated Imdur. He has been treated with Ranexa. I questioned him carefully to try to understand what's symptoms he was treating with the Ranexa but he cannot recall. At times he only takes it once a day and he doesn't notice a difference. He's not sure whether it helps with anything as he's never really had chest discomfort and has never taken sublingual nitroglycerin. He doesn't have arm discomfort. He doesn't have shortness of breath that he used to have. However, he doesn't work is much as he used to be either. He still working as a TEFL teacher and with this does some walking and he does some minor exercises and he cannot bring on any symptoms. The patient denies any new symptoms such as chest discomfort, neck or arm discomfort. There has been no new shortness of breath, PND or orthopnea. There have been no reported palpitations, presyncope or syncope   Past Medical History  Diagnosis Date  . Diverticulosis of colon (without mention of hemorrhage)   . Other and unspecified hyperlipidemia   . Benign neoplasm of colon   . Coronary atherosclerosis of unspecified type of vessel, native or graft   . Esophageal reflux   . Unspecified disorder of thyroid   . Blood in stool     Past Surgical  History  Procedure Laterality Date  . Tonsillectomy  1955     Current Outpatient Prescriptions  Medication Sig Dispense Refill  . omeprazole (PRILOSEC OTC) 20 MG tablet TAKE TWO CAPSULES EACH DAY 60 tablet 6  . RANEXA 500 MG 12 hr tablet TAKE ONE TABLET BY MOUTH TWICE DAILY 60 tablet 2  . rosuvastatin (CRESTOR) 20 MG tablet TAKE ONE (1) TABLET EACH DAY 30 tablet 6  . SYNTHROID 75 MCG tablet TAKE ONE (1) TABLET EACH DAY 30 tablet 6  . Vitamin D, Ergocalciferol, (DRISDOL) 50000 UNITS CAPS capsule TAKE 1 CAPSULE EVERY WEEK 12 capsule 1  . aspirin 81 MG EC tablet Take 81 mg by mouth daily.      . nitroGLYCERIN (NITROSTAT) 0.4 MG SL tablet Place 0.4 mg under the tongue every 5 (five) minutes as needed.     No current facility-administered medications for this visit.    Allergies:   Lipitor    ROS:  Please see the history of present illness.   Otherwise, review of systems are positive for none.   All other systems are reviewed and negative.    PHYSICAL EXAM: VS:  BP 120/81 mmHg  Pulse 59  Ht 6\' 3"  (1.905 m)  Wt 200 lb (90.719 kg)  BMI 25.00 kg/m2 , BMI Body mass index is 25 kg/(m^2). GENERAL:  Well appearing HEENT:  Pupils equal round and reactive, fundi not visualized, oral mucosa unremarkable NECK:  No jugular venous distention, waveform within normal limits, carotid upstroke brisk and symmetric, no bruits, no thyromegaly LYMPHATICS:  No cervical, inguinal adenopathy LUNGS:  Clear to auscultation bilaterally BACK:  No CVA tenderness CHEST:  Unremarkable HEART:  PMI not displaced or sustained,S1 and S2 within normal limits, no S3, no S4, no clicks, no rubs, no murmurs ABD:  Flat, positive bowel sounds normal in frequency in pitch, no bruits, no rebound, no guarding, no midline pulsatile mass, no hepatomegaly, no splenomegaly EXT:  2 plus pulses throughout, no edema, no cyanosis no clubbing SKIN:  No rashes no nodules NEURO:  Cranial nerves II through XII grossly intact, motor  grossly intact throughout PSYCH:  Cognitively intact, oriented to person place and time   EKG:  EKG is not ordered today. The ekg ordered today demonstrates sinus rhythm, rate 59, right axis deviation, no acute ST-T wave changes. 08/16/2014   Recent Labs: 06/08/2014: ALT 21; BUN 13; Creatinine, Ser 1.02; Hemoglobin 13.6*; Potassium 4.1; Sodium 141; TSH 4.890*    Wt Readings from Last 3 Encounters:  08/16/14 200 lb (90.719 kg)  06/08/14 202 lb (91.627 kg)  06/24/13 200 lb (90.719 kg)      Other studies Reviewed: Additional studies/ records that were reviewed today include: Previous cath reports and notes. Review of the above records demonstrates:  Please see elsewhere in the note.     ASSESSMENT AND PLAN:  CAD:  I had a long discussion with the patient doesn't seem like he has active symptoms. He's very concerned about the cost of Ranexa. I am not sure that this is symptomatically making a difference. Therefore, I have suggested he go to once a day and then try himself off of this to see if he has any discomfort or shortness of breath which might be his anginal equivalent. I cannot follow him with a stress test as this was abnormal twice but lately catheterizations which amounted only to medical management. Therefore, no further testing is indicated. He will be managed with continued risk reduction.  DYSLIPIDEMIA:  I will defer to Dr. Laurance Flatten.     Current medicines are reviewed at length with the patient today.  The patient does not have concerns regarding medicines.  The following changes have been made:  As above.  Labs/ tests ordered today include: None No orders of the defined types were placed in this encounter.     Disposition:   FU with me in six months    Signed, Minus Breeding, MD  08/16/2014 1:26 PM    [facility]

## 2014-11-15 ENCOUNTER — Other Ambulatory Visit: Payer: Self-pay | Admitting: Family Medicine

## 2014-11-15 NOTE — Telephone Encounter (Signed)
Last seen and last Vit D 06/08/14  DWM   33.0

## 2014-11-15 NOTE — Telephone Encounter (Signed)
Medication can be refill for a couple of months so the patient can come by and get a vitamin D level to make sure that he is not getting too much vitamin D.

## 2014-11-17 ENCOUNTER — Other Ambulatory Visit: Payer: Self-pay | Admitting: Family Medicine

## 2014-11-21 DIAGNOSIS — R69 Illness, unspecified: Secondary | ICD-10-CM | POA: Diagnosis not present

## 2014-12-12 ENCOUNTER — Ambulatory Visit (INDEPENDENT_AMBULATORY_CARE_PROVIDER_SITE_OTHER): Payer: Medicare HMO | Admitting: Family Medicine

## 2014-12-12 ENCOUNTER — Other Ambulatory Visit: Payer: Self-pay | Admitting: Family Medicine

## 2014-12-12 ENCOUNTER — Encounter: Payer: Self-pay | Admitting: Family Medicine

## 2014-12-12 ENCOUNTER — Ambulatory Visit (INDEPENDENT_AMBULATORY_CARE_PROVIDER_SITE_OTHER): Payer: Medicare HMO

## 2014-12-12 VITALS — BP 129/76 | HR 58 | Temp 98.5°F | Ht 75.0 in | Wt 199.0 lb

## 2014-12-12 DIAGNOSIS — E039 Hypothyroidism, unspecified: Secondary | ICD-10-CM | POA: Diagnosis not present

## 2014-12-12 DIAGNOSIS — M79672 Pain in left foot: Secondary | ICD-10-CM

## 2014-12-12 DIAGNOSIS — N400 Enlarged prostate without lower urinary tract symptoms: Secondary | ICD-10-CM | POA: Diagnosis not present

## 2014-12-12 DIAGNOSIS — E559 Vitamin D deficiency, unspecified: Secondary | ICD-10-CM

## 2014-12-12 DIAGNOSIS — I2510 Atherosclerotic heart disease of native coronary artery without angina pectoris: Secondary | ICD-10-CM | POA: Diagnosis not present

## 2014-12-12 DIAGNOSIS — E785 Hyperlipidemia, unspecified: Secondary | ICD-10-CM | POA: Diagnosis not present

## 2014-12-12 DIAGNOSIS — K219 Gastro-esophageal reflux disease without esophagitis: Secondary | ICD-10-CM | POA: Diagnosis not present

## 2014-12-12 MED ORDER — ROSUVASTATIN CALCIUM 20 MG PO TABS: ORAL_TABLET | ORAL | Status: DC

## 2014-12-12 MED ORDER — SYNTHROID 75 MCG PO TABS: ORAL_TABLET | ORAL | Status: DC

## 2014-12-12 MED ORDER — RANOLAZINE ER 500 MG PO TB12: 500.0000 mg | ORAL_TABLET | Freq: Two times a day (BID) | ORAL | Status: DC

## 2014-12-12 MED ORDER — OMEPRAZOLE MAGNESIUM 20 MG PO TBEC: DELAYED_RELEASE_TABLET | ORAL | Status: DC

## 2014-12-12 NOTE — Progress Notes (Signed)
Subjective:    Patient ID: [name], male    DOB: 13-Jun-1950, 64 y.o.   MRN: [id number]  HPI Pt here for follow up and management of chronic medical problems which includes hypothyroid and hyperlipidemia. He is taking medications regularly. He is followed by the cardiologist regularly and last saw him in the summer. He is due to return an FOBT. He does complain of some heel pain at times. He has had a known heel spur for several years but recently injured the toes in his right foot and he is having more trouble with his left heel since he is been favoring it. He is requesting refills on all of his medicines. His lab work is due. The patient will get his blood work done today as he has not had anything to eat since breakfast this morning. He says he sees the cardiologist every 6-12 months. He denies any chest pain or shortness of breath. He is swallowing his food without problems and having no indigestion or heartburn blood in the stool or black tarry bowel movements. He is passing his water without problems but does have some nocturia. He refuses today to do a flu shot or Prevnar vaccine.      Patient Active Problem List   Diagnosis Date Noted  . Diverticulosis of colon (without mention of hemorrhage)   . Other and unspecified hyperlipidemia   . Benign neoplasm of colon   . COLONIC POLYPS 06/12/2007  . UNSPECIFIED DISORDER OF THYROID 06/12/2007  . HYPERLIPIDEMIA 06/12/2007  . CAD 06/12/2007  . GERD 06/12/2007  . DIVERTICULOSIS, COLON 06/12/2007  . GUAIAC POSITIVE STOOL 06/12/2007   Outpatient Encounter Prescriptions as of 12/12/2014  Medication Sig  . aspirin 81 MG EC tablet Take 81 mg by mouth daily.    . nitroGLYCERIN (NITROSTAT) 0.4 MG SL tablet Place 0.4 mg under the tongue every 5 (five) minutes as needed.  Marland Kitchen omeprazole (PRILOSEC OTC) 20 MG tablet TAKE TWO CAPSULES EACH DAY  . RANEXA 500 MG 12 hr tablet TAKE ONE TABLET BY MOUTH TWICE DAILY  . rosuvastatin (CRESTOR) 20  MG tablet TAKE ONE (1) TABLET EACH DAY  . SYNTHROID 75 MCG tablet TAKE ONE (1) TABLET EACH DAY  . Vitamin D, Ergocalciferol, (DRISDOL) 50000 UNITS CAPS capsule TAKE 1 CAPSULE EVERY WEEK   No facility-administered encounter medications on file as of 12/12/2014.      Review of Systems  Constitutional: Negative.   HENT: Negative.   Eyes: Negative.   Respiratory: Negative.   Cardiovascular: Negative.   Gastrointestinal: Negative.   Endocrine: Negative.   Genitourinary: Negative.   Musculoskeletal: Negative.        Left heel pain at times  Skin: Negative.   Allergic/Immunologic: Negative.   Neurological: Negative.   Hematological: Negative.   Psychiatric/Behavioral: Negative.        Objective:   Physical Exam  Constitutional: He is oriented to person, place, and time. He appears well-developed and well-nourished. No distress.  HENT:  Head: Normocephalic and atraumatic.  Right Ear: External ear normal.  Left Ear: External ear normal.  Nose: Nose normal.  Mouth/Throat: Oropharynx is clear and moist. No oropharyngeal exudate.  Eyes: Conjunctivae and EOM are normal. Pupils are equal, round, and reactive to light. Right eye exhibits no discharge. Left eye exhibits no discharge. No scleral icterus.  Neck: Normal range of motion. Neck supple. No thyromegaly present.  Cardiovascular: Normal rate, regular rhythm, normal heart sounds and intact distal pulses.   No murmur  heard. The heart has a regular rate and rhythm at 60/m.  Pulmonary/Chest: Effort normal and breath sounds normal. No respiratory distress. He has no wheezes. He has no rales. He exhibits no tenderness.  Clear anteriorly and posteriorly  Abdominal: Soft. Bowel sounds are normal. He exhibits no mass. There is no tenderness. There is no rebound and no guarding.  There is some slight epigastric tenderness without organ enlargement or masses.  Musculoskeletal: Normal range of motion. He exhibits tenderness. He exhibits no  edema.  There is some slight tenderness with pressure on the left plantar surface of the left calcaneus.  Lymphadenopathy:    He has no cervical adenopathy.  Neurological: He is alert and oriented to person, place, and time. He has normal reflexes. No cranial nerve deficit.  Skin: Skin is warm and dry. No rash noted.  Psychiatric: He has a normal mood and affect. His behavior is normal. Judgment and thought content normal.  Nursing note and vitals reviewed.   BP 129/76 mmHg  Pulse 58  Temp(Src) 98.5 F (36.9 C) (Oral)  Ht _0  (1.905 m)  Wt 199 lb (90.266 kg)  BMI 24.87 kg/m2  [facility] reading (PRIMARY) by  Dr. Louretta Parma heel--heel spur                                      Assessment & Plan:  1. Vitamin D deficiency -Continue current vitamin D replacement pending results of lab work - Vit D  25 hydroxy (rtn osteoporosis monitoring); Future - Vit D  25 hydroxy (rtn osteoporosis monitoring)  2. Hyperlipidemia -Continue Crestor pending results of lab work in addition to aggressive therapeutic lifestyle changes - BMP8+EGFR; Future - Hepatic function panel; Future - Lipid panel; Future - BMP8+EGFR - Hepatic function panel - Lipid panel  3. BPH (benign prostatic hypertrophy) -The patient does have some symptoms of nocturia and this exam was not done today.  4. Gastroesophageal reflux disease, esophagitis presence not specified -He has no complaints with his GERD. He continues to take omeprazole. - Hepatic function panel; Future - Hepatic function panel  5. Hypothyroidism, unspecified hypothyroidism type -No symptoms of hypothyroidism and he should continue with current treatment pending results of lab work - Thyroid Panel With TSH; Future - Thyroid Panel With TSH  6. Heel pain, left -Use a heel cup and some warm wet compresses to the left heel frequently for the next few days to see if we can settle the inflammation down in his heel. If the symptoms persist we may need to  get him to see an orthopedist for injection. - DG Foot Complete Left; Future  7. Coronary artery disease involving native coronary artery of native heart without angina pectoris -Continue to follow-up with cardiology Patient Instructions                       Medicare Annual Wellness Visit  [facility] and the medical providers at [facility] strive to bring you the best medical care.  In doing so we not only want to address your current medical conditions and concerns but also to detect new conditions early and prevent illness, disease and health-related problems.    Medicare offers a yearly Wellness Visit which allows our clinical staff to assess your need for preventative services including immunizations, lifestyle education, counseling to decrease risk of preventable diseases and screening for fall risk and  other medical concerns.    This visit is provided free of charge (no copay) for all Medicare recipients. The clinical pharmacists at [facility] have begun to conduct these Wellness Visits which will also include a thorough review of all your medications.    As you primary medical provider recommend that you make an appointment for your Annual Wellness Visit if you have not done so already this year.  You may set up this appointment before you leave today or you may call back ([phone]) and schedule an appointment.  Please make sure when you call that you mention that you are scheduling your Annual Wellness Visit with the clinical pharmacist so that the appointment may be made for the proper length of time.     Continue current medications. Continue good therapeutic lifestyle changes which include good diet and exercise. Fall precautions discussed with patient. If an FOBT was given today- please return it to our front desk. If you are over 20 years old - you may need Prevnar 11 or the adult Pneumonia vaccine.  **Flu shots are available---  please call and schedule a FLU-CLINIC appointment**  After your visit with [place] today you will receive a survey in the mail or online from [employer] regarding your care with [place]. Please take a moment to fill this out. Your feedback is very important to [place] as you can help [place] better understand your patient needs as well as improve your experience and satisfaction. WE CARE ABOUT YOU!!!   Continue to follow-up with cardiology Continue current medications as well as those prescribed by the cardiologist Use a heel cup and try using warm wet compresses to the left heel has the spur Return the FOBT   Arrie Senate MD

## 2014-12-12 NOTE — Patient Instructions (Addendum)
Medicare Annual Wellness Visit  [facility] and the medical providers at [facility] strive to bring you the best medical care.  In doing so we not only want to address your current medical conditions and concerns but also to detect new conditions early and prevent illness, disease and health-related problems.    Medicare offers a yearly Wellness Visit which allows our clinical staff to assess your need for preventative services including immunizations, lifestyle education, counseling to decrease risk of preventable diseases and screening for fall risk and other medical concerns.    This visit is provided free of charge (no copay) for all Medicare recipients. The clinical pharmacists at [facility] have begun to conduct these Wellness Visits which will also include a thorough review of all your medications.    As you primary medical provider recommend that you make an appointment for your Annual Wellness Visit if you have not done so already this year.  You may set up this appointment before you leave today or you may call back ([phone]) and schedule an appointment.  Please make sure when you call that you mention that you are scheduling your Annual Wellness Visit with the clinical pharmacist so that the appointment may be made for the proper length of time.     Continue current medications. Continue good therapeutic lifestyle changes which include good diet and exercise. Fall precautions discussed with patient. If an FOBT was given today- please return it to our front desk. If you are over 42 years old - you may need Prevnar 43 or the adult Pneumonia vaccine.  **Flu shots are available--- please call and schedule a FLU-CLINIC appointment**  After your visit with [place] today you will receive a survey in the mail or online from [employer] regarding your care with [place]. Please take a moment to fill this out. Your feedback is very  important to [place] as you can help [place] better understand your patient needs as well as improve your experience and satisfaction. WE CARE ABOUT YOU!!!   Continue to follow-up with cardiology Continue current medications as well as those prescribed by the cardiologist Use a heel cup and try using warm wet compresses to the left heel has the spur Return the FOBT

## 2014-12-13 LAB — HEPATIC FUNCTION PANEL
ALK PHOS: 77 IU/L (ref 39–117)
ALT: 23 IU/L (ref 0–44)
AST: 21 IU/L (ref 0–40)
Albumin: 4.8 g/dL (ref 3.6–4.8)
BILIRUBIN, DIRECT: 0.14 mg/dL (ref 0.00–0.40)
Bilirubin Total: 0.6 mg/dL (ref 0.0–1.2)
Total Protein: 7.9 g/dL (ref 6.0–8.5)

## 2014-12-13 LAB — LIPID PANEL
CHOL/HDL RATIO: 6.1 ratio — AB (ref 0.0–5.0)
Cholesterol, Total: 233 mg/dL — ABNORMAL HIGH (ref 100–199)
HDL: 38 mg/dL — AB (ref >39)
Triglycerides: 696 mg/dL (ref 0–149)

## 2014-12-13 LAB — BMP8+EGFR
BUN / CREAT RATIO: 15 (ref 10–22)
BUN: 15 mg/dL (ref 8–27)
CO2: 24 mmol/L (ref 18–29)
CREATININE: 0.97 mg/dL (ref 0.76–1.27)
Calcium: 9.7 mg/dL (ref 8.6–10.2)
Chloride: 96 mmol/L — ABNORMAL LOW (ref 97–106)
GFR calc non Af Amer: 82 mL/min/{1.73_m2} (ref >59)
GFR, EST AFRICAN AMERICAN: 94 mL/min/{1.73_m2} (ref >59)
Glucose: 90 mg/dL (ref 65–99)
Potassium: 4.4 mmol/L (ref 3.5–5.2)
Sodium: 138 mmol/L (ref 136–144)

## 2014-12-13 LAB — THYROID PANEL WITH TSH
FREE THYROXINE INDEX: 2 (ref 1.2–4.9)
T3 Uptake Ratio: 28 % (ref 24–39)
T4, Total: 7.2 ug/dL (ref 4.5–12.0)
TSH: 4.17 u[IU]/mL (ref 0.450–4.500)

## 2014-12-13 LAB — VITAMIN D 25 HYDROXY (VIT D DEFICIENCY, FRACTURES): VIT D 25 HYDROXY: 38.2 ng/mL (ref 30.0–100.0)

## 2014-12-14 NOTE — Progress Notes (Signed)
Patient called and results were given.  Patient verbalized understanding and will come in for a fasting lipid panel.

## 2014-12-26 DIAGNOSIS — H538 Other visual disturbances: Secondary | ICD-10-CM | POA: Diagnosis not present

## 2014-12-26 DIAGNOSIS — H2513 Age-related nuclear cataract, bilateral: Secondary | ICD-10-CM | POA: Diagnosis not present

## 2015-04-03 DIAGNOSIS — R69 Illness, unspecified: Secondary | ICD-10-CM | POA: Diagnosis not present

## 2015-04-13 ENCOUNTER — Ambulatory Visit (INDEPENDENT_AMBULATORY_CARE_PROVIDER_SITE_OTHER): Payer: Medicare HMO | Admitting: Pediatrics

## 2015-04-13 VITALS — BP 128/70 | HR 60 | Temp 97.6°F | Ht 75.0 in | Wt 205.8 lb

## 2015-04-13 DIAGNOSIS — J069 Acute upper respiratory infection, unspecified: Secondary | ICD-10-CM

## 2015-04-13 DIAGNOSIS — R067 Sneezing: Secondary | ICD-10-CM | POA: Diagnosis not present

## 2015-04-13 DIAGNOSIS — R05 Cough: Secondary | ICD-10-CM | POA: Diagnosis not present

## 2015-04-13 DIAGNOSIS — R059 Cough, unspecified: Secondary | ICD-10-CM

## 2015-04-13 MED ORDER — GUAIFENESIN-CODEINE 100-10 MG/5ML PO SOLN: 5.0000 mL | Freq: Three times a day (TID) | ORAL | Status: DC | PRN

## 2015-04-13 MED ORDER — FLUTICASONE PROPIONATE 50 MCG/ACT NA SUSP: 2.0000 | Freq: Every day | NASAL | Status: DC

## 2015-04-13 NOTE — Progress Notes (Signed)
Subjective:    Patient ID: Corey Ochs., male    DOB: 1950/08/26, 65 y.o.   MRN: [id number]  CC: Sore Throat; Hoarse; Nasal Congestion; Cough; and Chills   HPI: Corey Glancy. is a 65 y.o. male presenting for Sore Throat; Hoarse; Nasal Congestion; Cough; and Chills  Watery scratchy eyes Turned into more congestion overnight Around a lot of people recently Last night started subjective fevers Taking mucinex zaxby's    Depression screen [facility] 2/9 12/12/2014 06/08/2014 06/24/2013  Decreased Interest 0 0 0  Down, Depressed, Hopeless 0 0 0  PHQ - 2 Score 0 0 0     Relevant past medical, surgical, family and social history reviewed and updated as indicated. Interim medical history since our last visit reviewed. Allergies and medications reviewed and updated.    ROS: Per HPI unless specifically indicated above  History  Smoking status  . Former Smoker -- 0.50 packs/day  . Types: Cigarettes  . Quit date: 07/22/1986  Smokeless tobacco  . Never Used    Past Medical History Patient Active Problem List   Diagnosis Date Noted  . Diverticulosis of colon (without mention of hemorrhage)   . Other and unspecified hyperlipidemia   . Benign neoplasm of colon   . COLONIC POLYPS 06/12/2007  . UNSPECIFIED DISORDER OF THYROID 06/12/2007  . HYPERLIPIDEMIA 06/12/2007  . CAD 06/12/2007  . GERD 06/12/2007  . DIVERTICULOSIS, COLON 06/12/2007  . GUAIAC POSITIVE STOOL 06/12/2007    Current Outpatient Prescriptions  Medication Sig Dispense Refill  . aspirin 81 MG EC tablet Take 81 mg by mouth daily.      . nitroGLYCERIN (NITROSTAT) 0.4 MG SL tablet Place 0.4 mg under the tongue every 5 (five) minutes as needed.    Marland Kitchen omeprazole (PRILOSEC OTC) 20 MG tablet TAKE TWO CAPSULES EACH DAY 60 tablet 11  . ranolazine (RANEXA) 500 MG 12 hr tablet Take 1 tablet (500 mg total) by mouth 2 (two) times daily. 60 tablet 11  . rosuvastatin (CRESTOR) 20 MG tablet TAKE ONE (1) TABLET EACH  DAY 30 tablet 11  . SYNTHROID 75 MCG tablet TAKE ONE (1) TABLET EACH DAY 30 tablet 11  . Vitamin D, Ergocalciferol, (DRISDOL) 50000 UNITS CAPS capsule TAKE 1 CAPSULE EVERY WEEK 12 capsule 0  . fluticasone (FLONASE) 50 MCG/ACT nasal spray Place 2 sprays into both nostrils daily. 16 g 6  . guaiFENesin-codeine 100-10 MG/5ML syrup Take 5 mLs by mouth 3 (three) times daily as needed for cough. 120 mL 0   No current facility-administered medications for this visit.       Objective:    BP 128/70 mmHg  Pulse 60  Temp(Src) 97.6 F (36.4 C) (Oral)  Ht 6\' 3"  (1.905 m)  Wt 205 lb 12.8 oz (93.35 kg)  BMI 25.72 kg/m2  Wt Readings from Last 3 Encounters:  04/13/15 205 lb 12.8 oz (93.35 kg)  12/12/14 199 lb (90.266 kg)  08/16/14 200 lb (90.719 kg)     Gen: NAD, alert, cooperative with exam, NCAT EYES: EOMI, no scleral injection or icterus ENT:  TMs slightly injected b/l, normal LR, no fluid behind TM. OP without erythema LYMPH: no cervical LAD CV: NRRR, normal S1/S2, no murmur, distal pulses 2+ b/l Resp: CTABL, no wheezes, normal WOB Ext: No edema, warm Neuro: Alert and oriented, strength equal b/l UE and LE, coordination grossly normal MSK: normal muscle bulk     Assessment & Plan:    [name] was seen today for sore  throat, hoarse, nasal congestion, cough and chills, likely due to acute URI. Discussed symptomatic care, return precautions.  Diagnoses and all orders for this visit:  Sneezing -     fluticasone (FLONASE) 50 MCG/ACT nasal spray; Place 2 sprays into both nostrils daily.  Acute URI  Cough -     guaiFENesin-codeine 100-10 MG/5ML syrup; Take 5 mLs by mouth 3 (three) times daily as needed for cough.    Follow up plan: Return if symptoms worsen or fail to improve.  Assunta Found, MD [facility] 04/13/2015, 6:08 PM

## 2015-05-16 IMAGING — CT CT ABD-PELV W/ CM
2 of 4 series · 16 of 46 positions shown, 18 images · IV contrast (Omnipaque 300)
Comparison: 07/05/2003 CT.

CLINICAL DATA: Abdominal pain with fullness and bloating for past 2
months.

CT ABDOMEN AND PELVIS WITH CONTRAST
TECHNIQUE: Multidetector CT imaging of the abdomen and pelvis was
performed following the standard protocol during bolus
administration of intravenous contrast.
Contrast: 100mL OMNIPAQUE IOHEXOL 300 MG/ML  SOLN

[Series 2: abd_pel_with 5.0 b40f · axial · 0.72mm/px · z∈[-508,-48]mm · 13 of 104 slices shown, 15 images]
[im 6/104  soft-tissue]
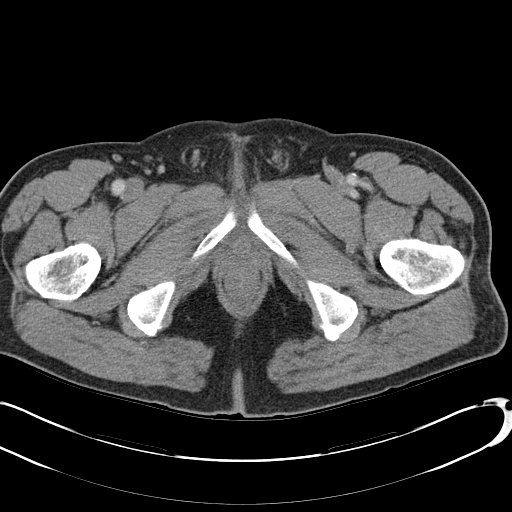
[im 6/104  bone]
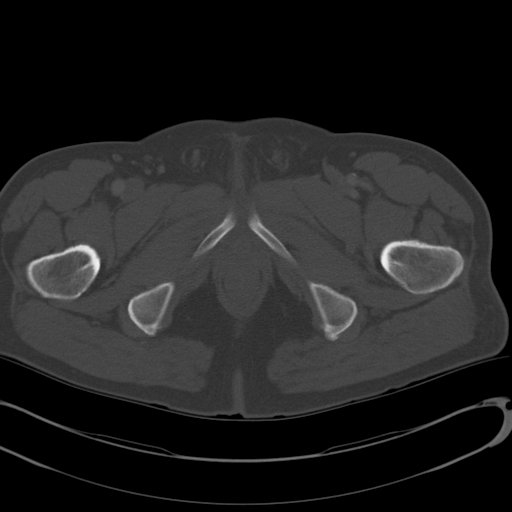
[im 16/104  soft-tissue]
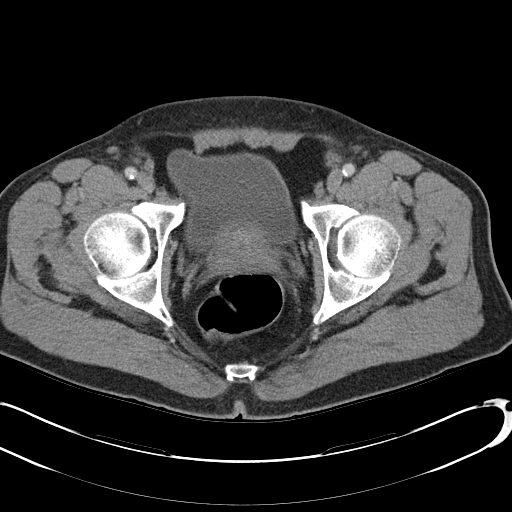
[im 21/104  soft-tissue]
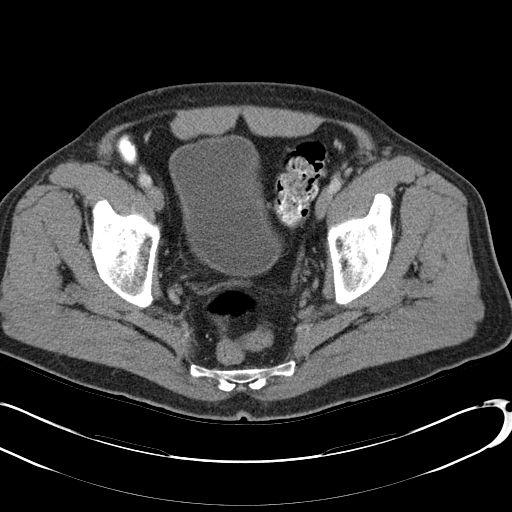
[im 31/104  soft-tissue]
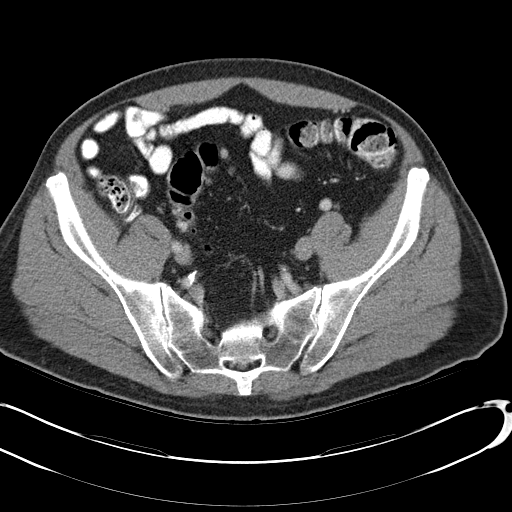
[im 37/104  soft-tissue]
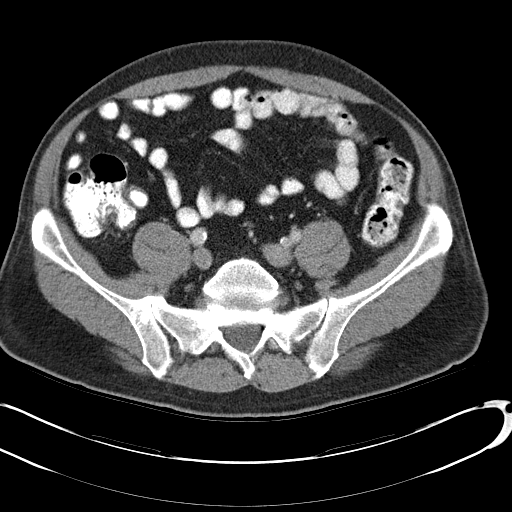
[im 47/104  soft-tissue]
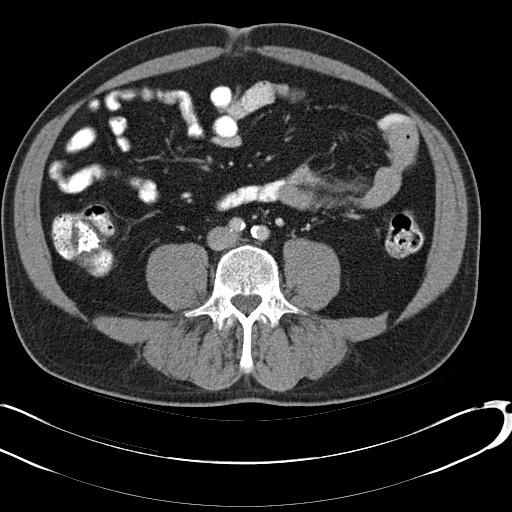
[im 52/104  soft-tissue]
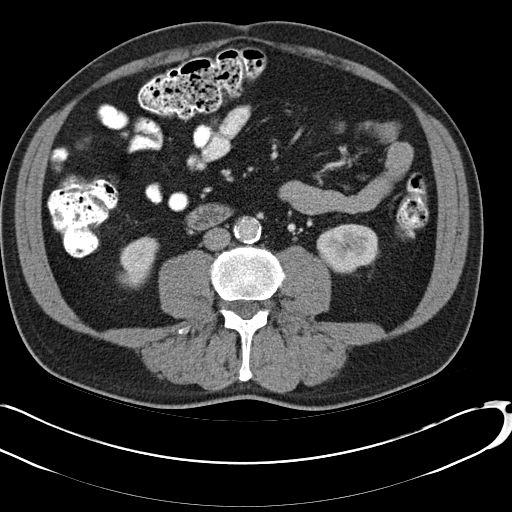
[im 57/104  soft-tissue]
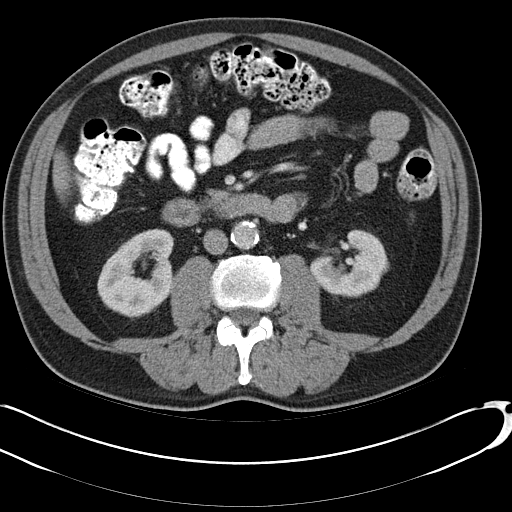
[im 67/104  soft-tissue]
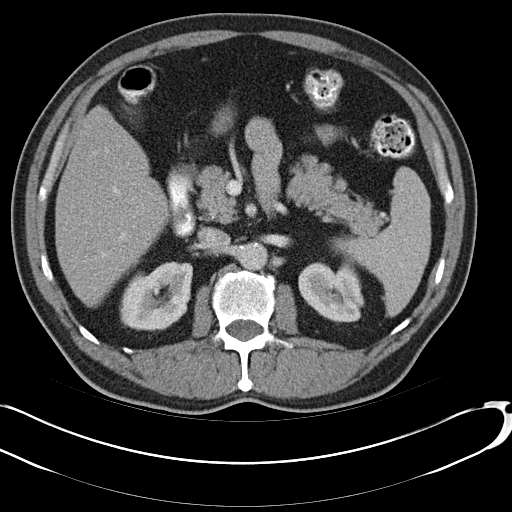
[im 67/104  bone]
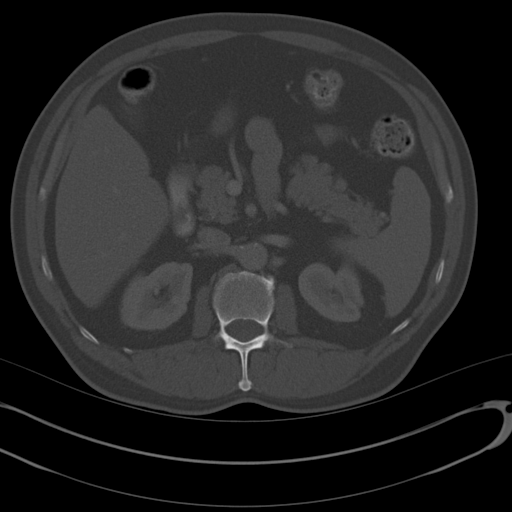
[im 73/104  soft-tissue]
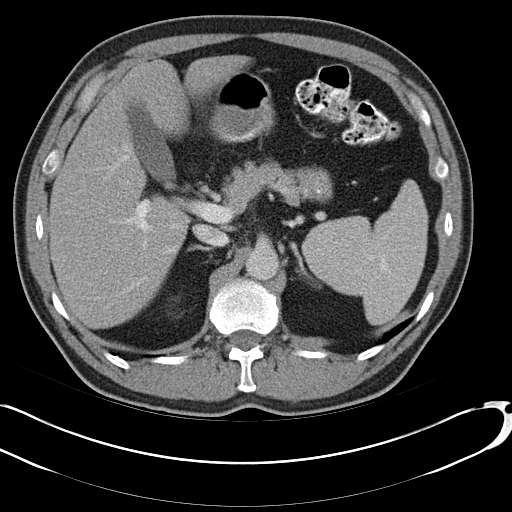
[im 83/104  soft-tissue]
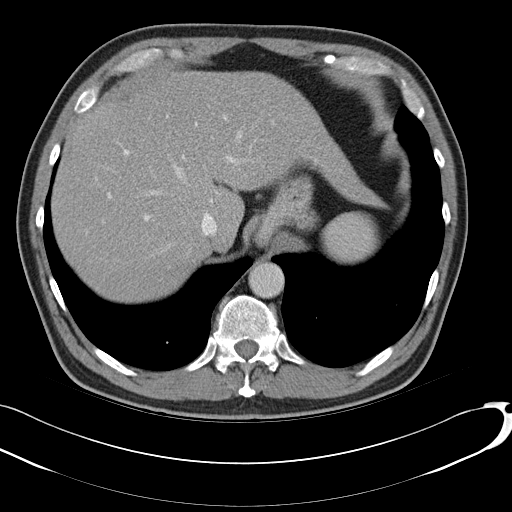
[im 88/104  soft-tissue]
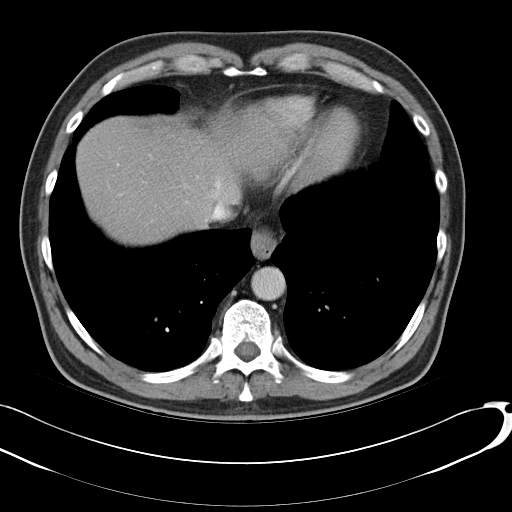
[im 98/104  soft-tissue]
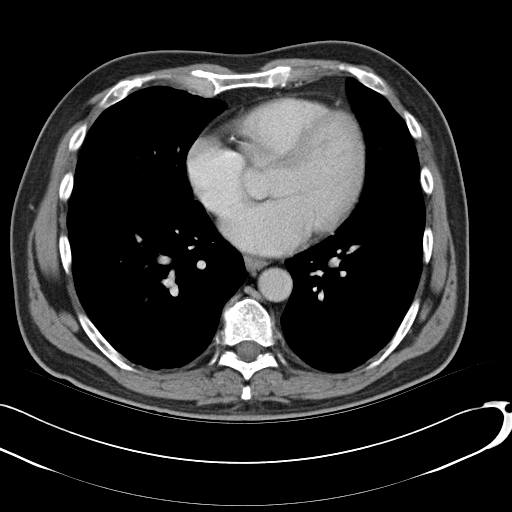

[Series 4: abd_pel_with 3.0 spo cor · coronal · 0.78mm/px · 3 of 89 slices shown]
[im 30/89  soft-tissue]
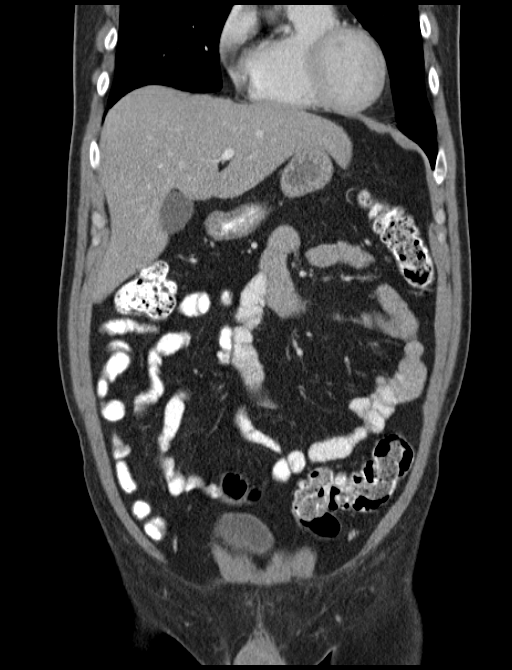
[im 40/89  soft-tissue]
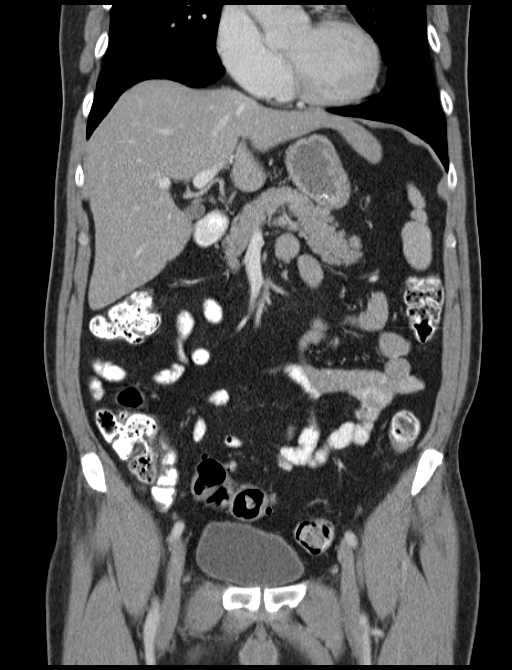
[im 49/89  soft-tissue]
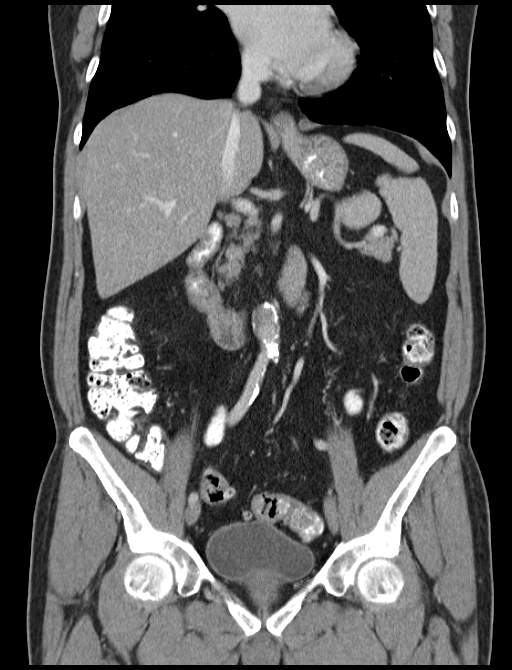

[16 of 46 positions shown; findings below may reference images not displayed]

FINDINGS: Lung bases clear.

Coronary artery calcifications.  Heart size within normal limits.

Small hiatal hernia.

, free fluid or free air.air.

Atherosclerotic type changes of the aorta with focal infrarenal
bulge measuring 2.8 x 2.3 cm whereas just above this level, the
aorta measures 2 x 2 cm.  Atherosclerotic type changes of the
common iliac arteries without aneurysmal dilation.  Femoral artery
calcification.

Diffuse fatty infiltration the liver without focal hepatic lesion.
No calcified gallstones.

Scarring of the left kidney filled by surrounding fat.  No
worrisome hepatic lesion or hydronephrosis.  No renal or ureteral
calculi.

No worrisome splenic, pancreatic or adrenal lesion.

Prostate gland appears slightly prominent in size which may be
normal however, correlation with clinical exam and laboratory
studies recommended.

Noncontrast filled views of the urinary bladder unremarkable.

Bilateral L5 pars defects with minimal anterior slip of bulge
contribute to spinal stenosis and foraminal narrowing.  No
worrisome osseous lesion.
IMPRESSION: Coronary artery calcifications.

Atherosclerotic type changes of the aorta with focal infrarenal
bulge measuring 2.8 x 2.3 cm whereas just above this level, the
aorta measures 2 x 2 cm.

Diffuse fatty infiltration the liver.

Small hiatal hernia.

Sigmoid diverticulosis.

Prostate gland appears slightly prominent in size which may be
normal however, correlation with clinical exam and laboratory
studies recommended.

Bilateral L5 pars defects

## 2015-05-29 DIAGNOSIS — R69 Illness, unspecified: Secondary | ICD-10-CM | POA: Diagnosis not present

## 2015-06-05 ENCOUNTER — Ambulatory Visit (INDEPENDENT_AMBULATORY_CARE_PROVIDER_SITE_OTHER): Payer: Medicare HMO | Admitting: Family Medicine

## 2015-06-05 ENCOUNTER — Other Ambulatory Visit: Payer: Self-pay | Admitting: Family Medicine

## 2015-06-05 ENCOUNTER — Ambulatory Visit (INDEPENDENT_AMBULATORY_CARE_PROVIDER_SITE_OTHER): Payer: Medicare HMO

## 2015-06-05 ENCOUNTER — Encounter: Payer: Self-pay | Admitting: Family Medicine

## 2015-06-05 ENCOUNTER — Encounter: Payer: Self-pay | Admitting: *Deleted

## 2015-06-05 ENCOUNTER — Encounter (INDEPENDENT_AMBULATORY_CARE_PROVIDER_SITE_OTHER): Payer: Self-pay

## 2015-06-05 VITALS — BP 122/76 | HR 65 | Temp 98.1°F | Ht 75.0 in | Wt 198.0 lb

## 2015-06-05 DIAGNOSIS — S20211A Contusion of right front wall of thorax, initial encounter: Secondary | ICD-10-CM | POA: Diagnosis not present

## 2015-06-05 DIAGNOSIS — R1010 Upper abdominal pain, unspecified: Secondary | ICD-10-CM

## 2015-06-05 MED ORDER — NAPROXEN 375 MG PO TBEC: 1.0000 | DELAYED_RELEASE_TABLET | Freq: Two times a day (BID) | ORAL | Status: DC | PRN

## 2015-06-05 NOTE — Addendum Note (Signed)
Addended by: Zannie Cove on: 06/05/2015 03:50 PM   Modules accepted: Orders

## 2015-06-05 NOTE — Progress Notes (Addendum)
Subjective:    Patient ID: Corey Ochs., male    DOB: Feb 14, 1950, 65 y.o.   MRN: [id number]  HPI Patient here today for pain from a fall that happened on May 26, 2015. This was 10 days ago. The patient fell and the grass was some new shoes and tumbled and fell on his side. He was apparently playing with his grandchildren. He did not hit anything hard. He says his elbow may have hit his right anterior lower rib cage. There is been no change in bowel habits or voiding habits. At the end of taking a deep breath he can feel the pain at his right lower anterior rib cage area.     Patient Active Problem List   Diagnosis Date Noted  . Diverticulosis of colon (without mention of hemorrhage)   . Other and unspecified hyperlipidemia   . Benign neoplasm of colon   . COLONIC POLYPS 06/12/2007  . UNSPECIFIED DISORDER OF THYROID 06/12/2007  . HYPERLIPIDEMIA 06/12/2007  . CAD 06/12/2007  . GERD 06/12/2007  . DIVERTICULOSIS, COLON 06/12/2007  . GUAIAC POSITIVE STOOL 06/12/2007   Outpatient Encounter Prescriptions as of 06/05/2015  Medication Sig  . aspirin 81 MG EC tablet Take 81 mg by mouth daily.    . fluticasone (FLONASE) 50 MCG/ACT nasal spray Place 2 sprays into both nostrils daily.  Marland Kitchen omeprazole (PRILOSEC OTC) 20 MG tablet TAKE TWO CAPSULES EACH DAY  . ranolazine (RANEXA) 500 MG 12 hr tablet Take 1 tablet (500 mg total) by mouth 2 (two) times daily.  . rosuvastatin (CRESTOR) 20 MG tablet TAKE ONE (1) TABLET EACH DAY  . SYNTHROID 75 MCG tablet TAKE ONE (1) TABLET EACH DAY  . Vitamin D, Ergocalciferol, (DRISDOL) 50000 UNITS CAPS capsule TAKE 1 CAPSULE EVERY WEEK  . nitroGLYCERIN (NITROSTAT) 0.4 MG SL tablet Place 0.4 mg under the tongue every 5 (five) minutes as needed. Reported on 06/05/2015  . [DISCONTINUED] guaiFENesin-codeine 100-10 MG/5ML syrup Take 5 mLs by mouth 3 (three) times daily as needed for cough.   No facility-administered encounter medications on file as of 06/05/2015.       Review of Systems  Constitutional: Negative.   HENT: Negative.   Eyes: Negative.   Respiratory: Negative.   Cardiovascular: Negative.   Gastrointestinal: Positive for abdominal pain (right lower quad.).  Endocrine: Negative.   Genitourinary: Negative.   Musculoskeletal: Positive for arthralgias (right rib area pain).  Skin: Negative.   Allergic/Immunologic: Negative.   Neurological: Negative.   Hematological: Negative.   Psychiatric/Behavioral: Negative.        Objective:   Physical Exam  Constitutional: He appears well-developed and well-nourished. No distress.  HENT:  Head: Normocephalic and atraumatic.  Eyes: Conjunctivae and EOM are normal. Pupils are equal, round, and reactive to light. Right eye exhibits no discharge. Left eye exhibits no discharge.  Neck: Normal range of motion.  Cardiovascular: Normal rate, regular rhythm and normal heart sounds.  Exam reveals no gallop and no friction rub.   No murmur heard. Pulmonary/Chest: Effort normal and breath sounds normal. He has no wheezes. He has no rales. He exhibits tenderness (there is tenderness over the right anterior inferior lateral rib cage to palpation.).  Abdominal: Soft. Bowel sounds are normal. He exhibits no distension. There is no tenderness. There is no rebound and no guarding.  Musculoskeletal: Normal range of motion.  Neurological: He is alert.  Skin: Skin is warm and dry.  No bruising in the area of discomfort  Psychiatric: He has  a normal mood and affect. His behavior is normal. Judgment and thought content normal.  Nursing note and vitals reviewed.  BP 122/76 mmHg  Pulse 65  Temp(Src) 98.1 F (36.7 C) (Oral)  Ht 6\' 3"  (1.905 m)  Wt 198 lb (89.812 kg)  BMI 24.75 kg/m2  [facility] reading (PRIMARY) by  Dr. Governor Rooks rib detail--results pending                                        Assessment & Plan:  1. Chest wall contusion, right, initial encounter -Take Naprosyn 375 enteric-coated 1  twice daily after breakfast and supper for a couple weeks, the patient should take his omeprazole at least twice daily before breakfast and supper while on the Naprosyn enteric-coated to protect his stomach -Use warm wet compresses 20 minutes 4 times daily -Avoid heavy lifting pushing pulling etc.  Patient Instructions  Warm wet compresses 20 minutes 4 or 5 times daily Naprosyn enteric-coated 375 mg 1 twice daily after breakfast and supper for 2 weeks,----make sure that he takes his omeprazole regularly while taking the Naprosyn as the Naprosyn can irritate the stomach   Arrie Senate MD

## 2015-06-05 NOTE — Patient Instructions (Addendum)
Warm wet compresses 20 minutes 4 or 5 times daily Naprosyn enteric-coated 375 mg 1 twice daily after breakfast and supper for 2 weeks,----make sure that he takes his omeprazole regularly while taking the Naprosyn as the Naprosyn can irritate the stomach

## 2015-06-06 NOTE — Telephone Encounter (Signed)
Last seen 06/05/15 DWM   Last Vit D 12/12/14  38.2

## 2015-06-19 ENCOUNTER — Encounter: Payer: Self-pay | Admitting: Family Medicine

## 2015-06-19 ENCOUNTER — Ambulatory Visit (INDEPENDENT_AMBULATORY_CARE_PROVIDER_SITE_OTHER): Payer: Medicare HMO | Admitting: Family Medicine

## 2015-06-19 VITALS — BP 122/64 | HR 51 | Temp 97.0°F | Ht 75.0 in | Wt 202.0 lb

## 2015-06-19 DIAGNOSIS — E034 Atrophy of thyroid (acquired): Secondary | ICD-10-CM

## 2015-06-19 DIAGNOSIS — Z1211 Encounter for screening for malignant neoplasm of colon: Secondary | ICD-10-CM

## 2015-06-19 DIAGNOSIS — E039 Hypothyroidism, unspecified: Secondary | ICD-10-CM

## 2015-06-19 DIAGNOSIS — S20212D Contusion of left front wall of thorax, subsequent encounter: Secondary | ICD-10-CM

## 2015-06-19 DIAGNOSIS — Z0000 Encounter for general adult medical examination without abnormal findings: Secondary | ICD-10-CM | POA: Diagnosis not present

## 2015-06-19 DIAGNOSIS — I2583 Coronary atherosclerosis due to lipid rich plaque: Secondary | ICD-10-CM

## 2015-06-19 DIAGNOSIS — N400 Enlarged prostate without lower urinary tract symptoms: Secondary | ICD-10-CM

## 2015-06-19 DIAGNOSIS — K219 Gastro-esophageal reflux disease without esophagitis: Secondary | ICD-10-CM | POA: Diagnosis not present

## 2015-06-19 DIAGNOSIS — I2510 Atherosclerotic heart disease of native coronary artery without angina pectoris: Secondary | ICD-10-CM

## 2015-06-19 DIAGNOSIS — E785 Hyperlipidemia, unspecified: Secondary | ICD-10-CM | POA: Diagnosis not present

## 2015-06-19 LAB — MICROSCOPIC EXAMINATION
Bacteria, UA: NONE SEEN
EPITHELIAL CELLS (NON RENAL): NONE SEEN /HPF (ref 0–10)
WBC UA: NONE SEEN /HPF (ref 0–?)

## 2015-06-19 LAB — URINALYSIS, COMPLETE
Bilirubin, UA: NEGATIVE
GLUCOSE, UA: NEGATIVE
KETONES UA: NEGATIVE
Leukocytes, UA: NEGATIVE
NITRITE UA: NEGATIVE
Protein, UA: NEGATIVE
Specific Gravity, UA: 1.02 (ref 1.005–1.030)
UUROB: 0.2 mg/dL (ref 0.2–1.0)
pH, UA: 7 (ref 5.0–7.5)

## 2015-06-19 NOTE — Patient Instructions (Addendum)
Medicare Annual Wellness Visit  [facility] and the medical providers at [facility] strive to bring you the best medical care.  In doing so we not only want to address your current medical conditions and concerns but also to detect new conditions early and prevent illness, disease and health-related problems.    Medicare offers a yearly Wellness Visit which allows our clinical staff to assess your need for preventative services including immunizations, lifestyle education, counseling to decrease risk of preventable diseases and screening for fall risk and other medical concerns.    This visit is provided free of charge (no copay) for all Medicare recipients. The clinical pharmacists at [facility] have begun to conduct these Wellness Visits which will also include a thorough review of all your medications.    As you primary medical provider recommend that you make an appointment for your Annual Wellness Visit if you have not done so already this year.  You may set up this appointment before you leave today or you may call back [phone] and schedule an appointment.  Please make sure when you call that you mention that you are scheduling your Annual Wellness Visit with the clinical pharmacist so that the appointment may be made for the proper length of time.    Continue with Crestor and current thyroid medicine and vitamin D. We will call with the results of the lab work as soon as those results become available Stay is active as possible and exercise as much as tolerated We will confirm when your next appointment with the cardiologist is and let you know about that.

## 2015-06-19 NOTE — Progress Notes (Signed)
Subjective:    Patient ID: Corey Ochs., male    DOB: 02-Aug-1950, 65 y.o.   MRN: [id number]  HPI Patient is here today for annual wellness exam and follow up of chronic medical problems which includes hyperlipidemia, and hypothyroid. He is taking medications regularly.He is continued to recover from a fall with chest wall and abdominal soreness. He's been taking the Naprosyn and has not irritated his stomach but he is also been taking omeprazole twice a day. He plans to finish this up soon not take anymore the Naprosyn and we'll reduce the omeprazole back to 1 daily at that time. He sees the cardiologist about once a year and is not sure when his next visit is scheduled with him. We will check into that and make sure he has an appointment for one year from the previous visit. He denies any chest pain or shortness of breath. He has no trouble currently with his reflux or heartburn. He does say that at times he has trouble swallowing pork as if it gets and strangled but is not getting any worse. He denies any blood in the stool or black tarry bowel movements. He is not having any trouble passing his water with burning pain or frequency. His sex life is working fine. He has had trouble with his back in the past but this seems to be not bothering his much now.    Patient Active Problem List   Diagnosis Date Noted  . Diverticulosis of colon (without mention of hemorrhage)   . Other and unspecified hyperlipidemia   . Benign neoplasm of colon   . COLONIC POLYPS 06/12/2007  . UNSPECIFIED DISORDER OF THYROID 06/12/2007  . HYPERLIPIDEMIA 06/12/2007  . CAD 06/12/2007  . GERD 06/12/2007  . DIVERTICULOSIS, COLON 06/12/2007  . GUAIAC POSITIVE STOOL 06/12/2007   Outpatient Encounter Prescriptions as of 06/19/2015  Medication Sig  . aspirin 81 MG EC tablet Take 81 mg by mouth daily.    . fluticasone (FLONASE) 50 MCG/ACT nasal spray Place 2 sprays into both nostrils daily.  . Naproxen (NAPROXEN) 375  MG TBEC Take 1 tablet (375 mg total) by mouth 2 (two) times daily as needed.  . nitroGLYCERIN (NITROSTAT) 0.4 MG SL tablet Place 0.4 mg under the tongue every 5 (five) minutes as needed. Reported on 06/05/2015  . omeprazole (PRILOSEC OTC) 20 MG tablet TAKE TWO CAPSULES EACH DAY  . ranolazine (RANEXA) 500 MG 12 hr tablet Take 1 tablet (500 mg total) by mouth 2 (two) times daily.  . rosuvastatin (CRESTOR) 20 MG tablet TAKE ONE (1) TABLET EACH DAY  . SYNTHROID 75 MCG tablet TAKE ONE (1) TABLET EACH DAY  . Vitamin D, Ergocalciferol, (DRISDOL) 50000 units CAPS capsule TAKE 1 CAPSULE EVERY WEEK   No facility-administered encounter medications on file as of 06/19/2015.      Review of Systems  Constitutional: Negative.   HENT: Negative.   Eyes: Negative.   Respiratory: Negative.   Cardiovascular: Negative.   Gastrointestinal: Negative.   Endocrine: Negative.   Genitourinary: Negative.   Musculoskeletal: Negative.   Skin: Negative.   Allergic/Immunologic: Negative.   Neurological: Negative.   Hematological: Negative.   Psychiatric/Behavioral: Negative.        Objective:   Physical Exam  Constitutional: He is oriented to person, place, and time. He appears well-developed and well-nourished. No distress.  HENT:  Head: Normocephalic and atraumatic.  Right Ear: External ear normal.  Left Ear: External ear normal.  Mouth/Throat: Oropharynx is clear and  moist. No oropharyngeal exudate.  Some nasal congestion bilaterally  Eyes: Conjunctivae and EOM are normal. Pupils are equal, round, and reactive to light. Right eye exhibits no discharge. Left eye exhibits no discharge. No scleral icterus.  Neck: Normal range of motion. Neck supple. No thyromegaly present.  Neck without bruits thyromegaly or adenopathy  Cardiovascular: Normal rate, regular rhythm, normal heart sounds and intact distal pulses.   No murmur heard. Heart has a regular rate and rhythm at 60/m  Pulmonary/Chest: Effort normal and  breath sounds normal. No respiratory distress. He has no wheezes. He has no rales. He exhibits no tenderness.  Clear anteriorly and posteriorly no axillary adenopathy  Abdominal: Soft. Bowel sounds are normal. He exhibits no mass. There is no tenderness. There is no rebound and no guarding.  No abdominal tenderness liver or spleen enlargement or bruits  Genitourinary: Rectum normal and penis normal.  The prostate is slightly enlarged but smooth without lumps or masses. There are no rectal masses. There are no inguinal hernias palpable. The external genitalia were within normal limits.  Musculoskeletal: Normal range of motion. He exhibits no edema or tenderness.  There is no specific chest wall soreness or tenderness today palpable.  Lymphadenopathy:    He has no cervical adenopathy.  Neurological: He is alert and oriented to person, place, and time. He has normal reflexes. No cranial nerve deficit.  Skin: Skin is warm and dry. No rash noted.  Psychiatric: He has a normal mood and affect. His behavior is normal. Judgment and thought content normal.  Nursing note and vitals reviewed.  BP 122/64 mmHg  Pulse 51  Temp(Src) 97 F (36.1 C) (Oral)  Ht 6' 3"  (1.905 m)  Wt 202 lb (91.627 kg)  BMI 25.25 kg/m2        Assessment & Plan:  1. Hyperlipidemia -Continue current treatment and as aggressive therapeutic lifestyle changes as possible - Lipid panel - CBC with Differential/Platelet  2. BPH (benign prostatic hypertrophy) -The prostate remains enlarged but soft and smooth and we will get a PSA for routine yearly purposes. He is having no symptoms with the enlarged prostate. - PSA, total and free - CBC with Differential/Platelet - Urinalysis, Complete  3. Gastroesophageal reflux disease, esophagitis presence not specified -He seems to be doing well with this with taking his omeprazole. - CBC with Differential/Platelet  4. Hypothyroidism, unspecified hypothyroidism type -Continue  current treatment pending results of lab work - CBC with Differential/Platelet - Thyroid Panel With TSH  5. Special screening for malignant neoplasms, colon - Fecal occult blood, imunochemical; Future  6. Health care maintenance -Arrange cardiology follow-up visit. - Fecal occult blood, imunochemical; Future - Lipid panel - PSA, total and free - BMP8+EGFR - CBC with Differential/Platelet - Hepatic function panel - VITAMIN D 25 Hydroxy (Vit-D Deficiency, Fractures) - Thyroid Panel With TSH - Urinalysis, Complete  7. Coronary artery disease due to lipid rich plaque -Follow-up with cardiology -Continue aggressive therapeutic lifestyle changes with diet and exercise and current cholesterol treatment  8. Hypothyroidism due to acquired atrophy of thyroid -Continue current treatment pending results of lab work  9. Chest wall contusion, left, subsequent encounter -This appears to be getting better he will finish the Naprosyn and his extra omeprazole and then go back to his omeprazole by itself.  Patient Instructions                       Medicare Annual Wellness Visit  [facility] and the medical providers  at [facility] strive to bring you the best medical care.  In doing so we not only want to address your current medical conditions and concerns but also to detect new conditions early and prevent illness, disease and health-related problems.    Medicare offers a yearly Wellness Visit which allows our clinical staff to assess your need for preventative services including immunizations, lifestyle education, counseling to decrease risk of preventable diseases and screening for fall risk and other medical concerns.    This visit is provided free of charge (no copay) for all Medicare recipients. The clinical pharmacists at [facility] have begun to conduct these Wellness Visits which will also include a thorough review of all your medications.     As you primary medical provider recommend that you make an appointment for your Annual Wellness Visit if you have not done so already this year.  You may set up this appointment before you leave today or you may call back [phone]) and schedule an appointment.  Please make sure when you call that you mention that you are scheduling your Annual Wellness Visit with the clinical pharmacist so that the appointment may be made for the proper length of time.    Continue with Crestor and current thyroid medicine and vitamin D. We will call with the results of the lab work as soon as those results become available Stay is active as possible and exercise as much as tolerated We will confirm when your next appointment with the cardiologist is and let you know about that.   Arrie Senate MD

## 2015-06-20 LAB — CBC WITH DIFFERENTIAL/PLATELET
BASOS ABS: 0 10*3/uL (ref 0.0–0.2)
Basos: 1 %
EOS (ABSOLUTE): 0.3 10*3/uL (ref 0.0–0.4)
Eos: 5 %
HEMOGLOBIN: 14 g/dL (ref 12.6–17.7)
Hematocrit: 43.3 % (ref 37.5–51.0)
Immature Grans (Abs): 0 10*3/uL (ref 0.0–0.1)
Immature Granulocytes: 0 %
LYMPHS ABS: 1.7 10*3/uL (ref 0.7–3.1)
LYMPHS: 31 %
MCH: 29 pg (ref 26.6–33.0)
MCHC: 32.3 g/dL (ref 31.5–35.7)
MCV: 90 fL (ref 79–97)
MONOCYTES: 8 %
Monocytes Absolute: 0.4 10*3/uL (ref 0.1–0.9)
Neutrophils Absolute: 3.1 10*3/uL (ref 1.4–7.0)
Neutrophils: 55 %
PLATELETS: 261 10*3/uL (ref 150–379)
RBC: 4.82 x10E6/uL (ref 4.14–5.80)
RDW: 14.4 % (ref 12.3–15.4)
WBC: 5.6 10*3/uL (ref 3.4–10.8)

## 2015-06-20 LAB — LIPID PANEL
CHOLESTEROL TOTAL: 217 mg/dL — AB (ref 100–199)
Chol/HDL Ratio: 5.3 ratio units — ABNORMAL HIGH (ref 0.0–5.0)
HDL: 41 mg/dL (ref >39)
TRIGLYCERIDES: 684 mg/dL — AB (ref 0–149)

## 2015-06-20 LAB — HEPATIC FUNCTION PANEL
ALBUMIN: 4.8 g/dL (ref 3.6–4.8)
ALK PHOS: 83 IU/L (ref 39–117)
ALT: 21 IU/L (ref 0–44)
AST: 21 IU/L (ref 0–40)
Bilirubin Total: 0.4 mg/dL (ref 0.0–1.2)
Bilirubin, Direct: 0.13 mg/dL (ref 0.00–0.40)
TOTAL PROTEIN: 7.7 g/dL (ref 6.0–8.5)

## 2015-06-20 LAB — PSA, TOTAL AND FREE
PSA FREE PCT: 17.4 %
PSA FREE: 0.59 ng/mL
Prostate Specific Ag, Serum: 3.4 ng/mL (ref 0.0–4.0)

## 2015-06-20 LAB — BMP8+EGFR
BUN / CREAT RATIO: 13 (ref 10–24)
BUN: 11 mg/dL (ref 8–27)
CALCIUM: 9.3 mg/dL (ref 8.6–10.2)
CHLORIDE: 98 mmol/L (ref 96–106)
CO2: 22 mmol/L (ref 18–29)
Creatinine, Ser: 0.82 mg/dL (ref 0.76–1.27)
GFR calc Af Amer: 107 mL/min/{1.73_m2} (ref >59)
GFR calc non Af Amer: 92 mL/min/{1.73_m2} (ref >59)
GLUCOSE: 84 mg/dL (ref 65–99)
POTASSIUM: 4.4 mmol/L (ref 3.5–5.2)
Sodium: 140 mmol/L (ref 134–144)

## 2015-06-20 LAB — THYROID PANEL WITH TSH
Free Thyroxine Index: 2.3 (ref 1.2–4.9)
T3 UPTAKE RATIO: 28 % (ref 24–39)
T4 TOTAL: 8.3 ug/dL (ref 4.5–12.0)
TSH: 4.63 u[IU]/mL — AB (ref 0.450–4.500)

## 2015-06-20 LAB — VITAMIN D 25 HYDROXY (VIT D DEFICIENCY, FRACTURES): VIT D 25 HYDROXY: 41.8 ng/mL (ref 30.0–100.0)

## 2015-06-20 MED ORDER — LEVOTHYROXINE SODIUM 88 MCG PO TABS: 88.0000 ug | ORAL_TABLET | Freq: Every day | ORAL | Status: DC

## 2015-06-20 NOTE — Addendum Note (Signed)
Addended by: Thana Ates on: 06/20/2015 05:03 PM   Modules accepted: Orders

## 2015-06-20 NOTE — Addendum Note (Signed)
Addended by: Wardell Heath on: 06/20/2015 09:18 AM   Modules accepted: Orders

## 2015-09-01 ENCOUNTER — Other Ambulatory Visit: Payer: Self-pay | Admitting: Family Medicine

## 2015-10-29 DIAGNOSIS — M9901 Segmental and somatic dysfunction of cervical region: Secondary | ICD-10-CM | POA: Diagnosis not present

## 2015-10-29 DIAGNOSIS — M47812 Spondylosis without myelopathy or radiculopathy, cervical region: Secondary | ICD-10-CM | POA: Diagnosis not present

## 2015-10-31 DIAGNOSIS — M47812 Spondylosis without myelopathy or radiculopathy, cervical region: Secondary | ICD-10-CM | POA: Diagnosis not present

## 2015-10-31 DIAGNOSIS — M9901 Segmental and somatic dysfunction of cervical region: Secondary | ICD-10-CM | POA: Diagnosis not present

## 2015-11-05 DIAGNOSIS — M47812 Spondylosis without myelopathy or radiculopathy, cervical region: Secondary | ICD-10-CM | POA: Diagnosis not present

## 2015-11-05 DIAGNOSIS — M9901 Segmental and somatic dysfunction of cervical region: Secondary | ICD-10-CM | POA: Diagnosis not present

## 2015-11-08 DIAGNOSIS — M9901 Segmental and somatic dysfunction of cervical region: Secondary | ICD-10-CM | POA: Diagnosis not present

## 2015-11-08 DIAGNOSIS — M47812 Spondylosis without myelopathy or radiculopathy, cervical region: Secondary | ICD-10-CM | POA: Diagnosis not present

## 2015-11-12 DIAGNOSIS — M47812 Spondylosis without myelopathy or radiculopathy, cervical region: Secondary | ICD-10-CM | POA: Diagnosis not present

## 2015-11-12 DIAGNOSIS — M9901 Segmental and somatic dysfunction of cervical region: Secondary | ICD-10-CM | POA: Diagnosis not present

## 2015-11-14 DIAGNOSIS — M9901 Segmental and somatic dysfunction of cervical region: Secondary | ICD-10-CM | POA: Diagnosis not present

## 2015-11-14 DIAGNOSIS — M47812 Spondylosis without myelopathy or radiculopathy, cervical region: Secondary | ICD-10-CM | POA: Diagnosis not present

## 2015-11-20 DIAGNOSIS — M9901 Segmental and somatic dysfunction of cervical region: Secondary | ICD-10-CM | POA: Diagnosis not present

## 2015-11-20 DIAGNOSIS — M47812 Spondylosis without myelopathy or radiculopathy, cervical region: Secondary | ICD-10-CM | POA: Diagnosis not present

## 2015-11-26 ENCOUNTER — Other Ambulatory Visit: Payer: Self-pay | Admitting: Family Medicine

## 2015-11-27 DIAGNOSIS — M47812 Spondylosis without myelopathy or radiculopathy, cervical region: Secondary | ICD-10-CM | POA: Diagnosis not present

## 2015-11-27 DIAGNOSIS — M9901 Segmental and somatic dysfunction of cervical region: Secondary | ICD-10-CM | POA: Diagnosis not present

## 2015-12-04 DIAGNOSIS — M47812 Spondylosis without myelopathy or radiculopathy, cervical region: Secondary | ICD-10-CM | POA: Diagnosis not present

## 2015-12-04 DIAGNOSIS — M9901 Segmental and somatic dysfunction of cervical region: Secondary | ICD-10-CM | POA: Diagnosis not present

## 2015-12-10 DIAGNOSIS — R69 Illness, unspecified: Secondary | ICD-10-CM | POA: Diagnosis not present

## 2015-12-18 DIAGNOSIS — M47812 Spondylosis without myelopathy or radiculopathy, cervical region: Secondary | ICD-10-CM | POA: Diagnosis not present

## 2015-12-18 DIAGNOSIS — M9901 Segmental and somatic dysfunction of cervical region: Secondary | ICD-10-CM | POA: Diagnosis not present

## 2015-12-27 ENCOUNTER — Ambulatory Visit: Payer: Medicare HMO | Admitting: Family Medicine

## 2016-01-01 DIAGNOSIS — M47812 Spondylosis without myelopathy or radiculopathy, cervical region: Secondary | ICD-10-CM | POA: Diagnosis not present

## 2016-01-01 DIAGNOSIS — M9901 Segmental and somatic dysfunction of cervical region: Secondary | ICD-10-CM | POA: Diagnosis not present

## 2016-01-14 ENCOUNTER — Other Ambulatory Visit: Payer: Self-pay | Admitting: Family Medicine

## 2016-01-15 DIAGNOSIS — M9901 Segmental and somatic dysfunction of cervical region: Secondary | ICD-10-CM | POA: Diagnosis not present

## 2016-01-15 DIAGNOSIS — M47812 Spondylosis without myelopathy or radiculopathy, cervical region: Secondary | ICD-10-CM | POA: Diagnosis not present

## 2016-01-21 ENCOUNTER — Telehealth: Payer: Self-pay | Admitting: Family Medicine

## 2016-01-21 ENCOUNTER — Other Ambulatory Visit: Payer: Medicare HMO

## 2016-01-21 DIAGNOSIS — I2510 Atherosclerotic heart disease of native coronary artery without angina pectoris: Secondary | ICD-10-CM | POA: Diagnosis not present

## 2016-01-21 DIAGNOSIS — E034 Atrophy of thyroid (acquired): Secondary | ICD-10-CM

## 2016-01-21 DIAGNOSIS — I2583 Coronary atherosclerosis due to lipid rich plaque: Secondary | ICD-10-CM

## 2016-01-21 DIAGNOSIS — E785 Hyperlipidemia, unspecified: Secondary | ICD-10-CM

## 2016-01-21 DIAGNOSIS — E559 Vitamin D deficiency, unspecified: Secondary | ICD-10-CM

## 2016-01-21 NOTE — Telephone Encounter (Signed)
Pt advised it's ok to come in for blood work, labs ordered.

## 2016-01-22 LAB — BMP8+EGFR
BUN / CREAT RATIO: 12 (ref 10–24)
BUN: 13 mg/dL (ref 8–27)
CALCIUM: 9.2 mg/dL (ref 8.6–10.2)
CO2: 29 mmol/L (ref 18–29)
Chloride: 97 mmol/L (ref 96–106)
Creatinine, Ser: 1.07 mg/dL (ref 0.76–1.27)
GFR calc Af Amer: 83 mL/min/{1.73_m2} (ref >59)
GFR, EST NON AFRICAN AMERICAN: 71 mL/min/{1.73_m2} (ref >59)
Glucose: 100 mg/dL — ABNORMAL HIGH (ref 65–99)
Potassium: 4.2 mmol/L (ref 3.5–5.2)
SODIUM: 139 mmol/L (ref 134–144)

## 2016-01-22 LAB — LIPID PANEL
CHOL/HDL RATIO: 5.6 ratio — AB (ref 0.0–5.0)
Cholesterol, Total: 231 mg/dL — ABNORMAL HIGH (ref 100–199)
HDL: 41 mg/dL (ref >39)
TRIGLYCERIDES: 508 mg/dL — AB (ref 0–149)

## 2016-01-22 LAB — CBC WITH DIFFERENTIAL/PLATELET
BASOS: 0 %
Basophils Absolute: 0 10*3/uL (ref 0.0–0.2)
EOS (ABSOLUTE): 0.2 10*3/uL (ref 0.0–0.4)
Eos: 3 %
HEMATOCRIT: 39.8 % (ref 37.5–51.0)
HEMOGLOBIN: 13.1 g/dL (ref 13.0–17.7)
IMMATURE GRANULOCYTES: 0 %
Immature Grans (Abs): 0 10*3/uL (ref 0.0–0.1)
LYMPHS ABS: 1.7 10*3/uL (ref 0.7–3.1)
Lymphs: 31 %
MCH: 28.8 pg (ref 26.6–33.0)
MCHC: 32.9 g/dL (ref 31.5–35.7)
MCV: 88 fL (ref 79–97)
MONOCYTES: 8 %
MONOS ABS: 0.4 10*3/uL (ref 0.1–0.9)
Neutrophils Absolute: 3.1 10*3/uL (ref 1.4–7.0)
Neutrophils: 58 %
Platelets: 247 10*3/uL (ref 150–379)
RBC: 4.55 x10E6/uL (ref 4.14–5.80)
RDW: 14.9 % (ref 12.3–15.4)
WBC: 5.4 10*3/uL (ref 3.4–10.8)

## 2016-01-22 LAB — THYROID PANEL WITH TSH
FREE THYROXINE INDEX: 1.8 (ref 1.2–4.9)
T3 Uptake Ratio: 26 % (ref 24–39)
T4, Total: 6.8 ug/dL (ref 4.5–12.0)
TSH: 4.91 u[IU]/mL — AB (ref 0.450–4.500)

## 2016-01-22 LAB — HEPATIC FUNCTION PANEL
ALT: 25 IU/L (ref 0–44)
AST: 22 IU/L (ref 0–40)
Albumin: 4.5 g/dL (ref 3.6–4.8)
Alkaline Phosphatase: 71 IU/L (ref 39–117)
BILIRUBIN TOTAL: 0.5 mg/dL (ref 0.0–1.2)
Bilirubin, Direct: 0.1 mg/dL (ref 0.00–0.40)
Total Protein: 7.4 g/dL (ref 6.0–8.5)

## 2016-01-22 LAB — VITAMIN D 25 HYDROXY (VIT D DEFICIENCY, FRACTURES): VIT D 25 HYDROXY: 39 ng/mL (ref 30.0–100.0)

## 2016-01-25 ENCOUNTER — Ambulatory Visit (INDEPENDENT_AMBULATORY_CARE_PROVIDER_SITE_OTHER): Payer: Medicare HMO | Admitting: Family Medicine

## 2016-01-25 ENCOUNTER — Encounter: Payer: Self-pay | Admitting: Family Medicine

## 2016-01-25 VITALS — BP 134/79 | HR 63 | Temp 97.7°F | Ht 75.0 in | Wt 208.0 lb

## 2016-01-25 DIAGNOSIS — R5383 Other fatigue: Secondary | ICD-10-CM

## 2016-01-25 DIAGNOSIS — I2510 Atherosclerotic heart disease of native coronary artery without angina pectoris: Secondary | ICD-10-CM

## 2016-01-25 DIAGNOSIS — R140 Abdominal distension (gaseous): Secondary | ICD-10-CM

## 2016-01-25 DIAGNOSIS — R0683 Snoring: Secondary | ICD-10-CM | POA: Diagnosis not present

## 2016-01-25 DIAGNOSIS — E785 Hyperlipidemia, unspecified: Secondary | ICD-10-CM | POA: Diagnosis not present

## 2016-01-25 DIAGNOSIS — I2583 Coronary atherosclerosis due to lipid rich plaque: Secondary | ICD-10-CM

## 2016-01-25 DIAGNOSIS — R067 Sneezing: Secondary | ICD-10-CM | POA: Diagnosis not present

## 2016-01-25 DIAGNOSIS — K219 Gastro-esophageal reflux disease without esophagitis: Secondary | ICD-10-CM

## 2016-01-25 DIAGNOSIS — E559 Vitamin D deficiency, unspecified: Secondary | ICD-10-CM | POA: Diagnosis not present

## 2016-01-25 DIAGNOSIS — K5904 Chronic idiopathic constipation: Secondary | ICD-10-CM | POA: Diagnosis not present

## 2016-01-25 DIAGNOSIS — E034 Atrophy of thyroid (acquired): Secondary | ICD-10-CM | POA: Diagnosis not present

## 2016-01-25 MED ORDER — LEVOTHYROXINE SODIUM 100 MCG PO TABS: 100.0000 ug | ORAL_TABLET | Freq: Every day | ORAL | 3 refills | Status: DC

## 2016-01-25 MED ORDER — VITAMIN D (ERGOCALCIFEROL) 1.25 MG (50000 UNIT) PO CAPS: ORAL_CAPSULE | ORAL | 1 refills | Status: DC

## 2016-01-25 MED ORDER — OMEPRAZOLE 20 MG PO CPDR: DELAYED_RELEASE_CAPSULE | ORAL | 3 refills | Status: DC

## 2016-01-25 MED ORDER — FLUTICASONE PROPIONATE 50 MCG/ACT NA SUSP
2.0000 | Freq: Every day | NASAL | 6 refills | Status: DC
Start: 2016-01-25 — End: 2017-03-06

## 2016-01-25 MED ORDER — ROSUVASTATIN CALCIUM 20 MG PO TABS: ORAL_TABLET | ORAL | 3 refills | Status: DC

## 2016-01-25 MED ORDER — RANOLAZINE ER 500 MG PO TB12: 500.0000 mg | ORAL_TABLET | Freq: Two times a day (BID) | ORAL | 3 refills | Status: DC

## 2016-01-25 NOTE — Patient Instructions (Addendum)
Medicare Annual Wellness Visit  [facility] and the medical providers at [facility] strive to bring you the best medical care.  In doing so we not only want to address your current medical conditions and concerns but also to detect new conditions early and prevent illness, disease and health-related problems.    Medicare offers a yearly Wellness Visit which allows our clinical staff to assess your need for preventative services including immunizations, lifestyle education, counseling to decrease risk of preventable diseases and screening for fall risk and other medical concerns.    This visit is provided free of charge (no copay) for all Medicare recipients. The clinical pharmacists at [facility] have begun to conduct these Wellness Visits which will also include a thorough review of all your medications.    As you primary medical provider recommend that you make an appointment for your Annual Wellness Visit if you have not done so already this year.  You may set up this appointment before you leave today or you may call back [phone] and schedule an appointment.  Please make sure when you call that you mention that you are scheduling your Annual Wellness Visit with the clinical pharmacist so that the appointment may be made for the proper length of time.     Continue current medications. Continue good therapeutic lifestyle changes which include good diet and exercise. Fall precautions discussed with patient. If an FOBT was given today- please return it to our front desk. If you are over 103 years old - you may need Prevnar 8 or the adult Pneumonia vaccine.  **Flu shots are available--- please call and schedule a FLU-CLINIC appointment**  After your visit with [place] today you will receive a survey in the mail or online from [employer] regarding your care with [place]. Please take a moment to fill this out. Your feedback is very  important to [place] as you can help [place] better understand your patient needs as well as improve your experience and satisfaction. WE CARE ABOUT YOU!!!   We will schedule you for a visit with the cardiologist because of your history of coronary artery disease and you have not had a follow-up in a good while We'll also schedule you for sleep apnea evaluation because of your daytime fatigue and history of nighttime snoring We'll also schedule you to meet with our clinical pharmacist to discuss your elevated triglyceride issues Do not forget to keep her appointment with your ophthalmologist Avoid milk cheese ice cream and dairy products and see if this  doesn't help your abdominal bloating and constipation Take align one daily For now continue with omeprazole but we may consider switching over to ranitidine in the future Avoid the use of overhead fans Continue with fluticasone and use nasal saline frequently in each nostril through the day

## 2016-01-25 NOTE — Addendum Note (Signed)
Addended by: Zannie Cove on: 01/25/2016 04:31 PM   Modules accepted: Orders

## 2016-01-25 NOTE — Progress Notes (Signed)
Subjective:    Patient ID: Corey Ochs., male    DOB: May 26, 1950, 65 y.o.   MRN: [id number]  HPI Pt here for follow up and management of chronic medical problems which includes hypothyroid and hyperlipidemia. He is taking medication regularly.Patient has had recent lab work and this will be reviewed with him during the visit today. His TSH was slightly more elevated and we will go ahead and increase his levothyroxine 200 g daily. The patient complains of some neck and rib pain and this is to be worse when he is stressed. He also says he falls asleep easily during the day and has been snoring a lot at night time. We will go ahead and schedule a sleep apnea evaluation for this patient. He is requesting refills on all his medicines. His vital signs are stable and his BMI is 25.3 and his weight is 208 is up 6 pounds since the last visit. There is a strong history of heart disease in his father. The patient has had some chest discomfort but more down in the abdominal area. This seems to be better. He says he stays active most the day and it does not appear to be related to activity but once again it is improved. He denies any shortness of breath. He does take omeprazole for his stomach and at some point in the future we may want to switch him over to ranitidine. He's passing his water okay but has frequency at nighttime. He does Himself being a lot more sleepy during the day and has actually been falling asleep and other people's homes during the day and he knows that he snores a lot so we definitely will schedule a sleep apnea evaluation. He has not seen the cardiologist in a good while and we will schedule him to see the local cardiologist that comes here from [facility] soon as we can get him in there. He does drink a lot of milk cheese ice cream and dairy products and this may be part of his problem with bloatedness and constipation. He will reduce these intake and will try to drink more water. He  also will start some align as a probiotic. He does have a very elevated cholesterol and we will schedule him to see the clinical pharmacist for further evaluation and management of this. The patient is also having some trouble with his eyes he has an upcoming appointment with Dr. Bing Plume.     Patient Active Problem List   Diagnosis Date Noted  . Diverticulosis of colon (without mention of hemorrhage)   . Hyperlipidemia LDL goal <70   . Benign neoplasm of colon   . COLONIC POLYPS 06/12/2007  . Hypothyroidism 06/12/2007  . Coronary artery disease 06/12/2007  . CAD 06/12/2007  . GERD 06/12/2007  . DIVERTICULOSIS, COLON 06/12/2007  . GUAIAC POSITIVE STOOL 06/12/2007   Outpatient Encounter Prescriptions as of 01/25/2016  Medication Sig  . aspirin 81 MG EC tablet Take 81 mg by mouth daily.    . fluticasone (FLONASE) 50 MCG/ACT nasal spray Place 2 sprays into both nostrils daily.  Marland Kitchen levothyroxine (SYNTHROID, LEVOTHROID) 88 MCG tablet Take 1 tablet (88 mcg total) by mouth daily.  . Naproxen (NAPROXEN) 375 MG TBEC Take 1 tablet (375 mg total) by mouth 2 (two) times daily as needed.  Marland Kitchen omeprazole (PRILOSEC) 20 MG capsule TAKE TWO CAPSULES EACH DAY  . ranolazine (RANEXA) 500 MG 12 hr tablet Take 1 tablet (500 mg total) by mouth 2 (two) times  daily.  . rosuvastatin (CRESTOR) 20 MG tablet TAKE ONE (1) TABLET EACH DAY  . Vitamin D, Ergocalciferol, (DRISDOL) 50000 units CAPS capsule TAKE 1 CAPSULE EVERY WEEK  . nitroGLYCERIN (NITROSTAT) 0.4 MG SL tablet Place 0.4 mg under the tongue every 5 (five) minutes as needed. Reported on 06/05/2015   No facility-administered encounter medications on file as of 01/25/2016.       Review of Systems  Constitutional: Negative.   HENT: Negative.   Eyes: Negative.   Respiratory: Negative.   Cardiovascular: Negative.   Gastrointestinal: Negative.   Endocrine: Negative.   Genitourinary: Negative.   Musculoskeletal: Positive for neck pain (seeing Dr Bethann Goo - the  pain seemed to be moving down - better now) and neck stiffness.  Skin: Negative.   Allergic/Immunologic: Negative.   Neurological: Negative.   Hematological: Negative.   Psychiatric/Behavioral: Negative.        Objective:   Physical Exam  Constitutional: He is oriented to person, place, and time. He appears well-developed and well-nourished. No distress.  HENT:  Head: Normocephalic and atraumatic.  Right Ear: External ear normal.  Left Ear: External ear normal.  Mouth/Throat: Oropharynx is clear and moist. No oropharyngeal exudate.  The patient has nasal congestion and turbinate swelling bilaterally  Eyes: Conjunctivae and EOM are normal. Pupils are equal, round, and reactive to light. Right eye exhibits no discharge. Left eye exhibits no discharge. No scleral icterus.  Follow-up with eye examination as planned  Neck: Normal range of motion. Neck supple. No thyromegaly present.  No bruits thyromegaly or anterior cervical adenopathy  Cardiovascular: Normal rate, regular rhythm, normal heart sounds and intact distal pulses.   No murmur heard. The heart has a regular rate and rhythm at 72/m  Pulmonary/Chest: Effort normal and breath sounds normal. No respiratory distress. He has no wheezes. He has no rales. He exhibits no tenderness.  Clear anteriorly and posteriorly no axillary adenopathy  Abdominal: Soft. Bowel sounds are normal. He exhibits no mass. There is tenderness. There is no rebound and no guarding.  Slight. Umbilical tenderness and slight suprapubic tenderness. No liver or spleen enlargement and no inguinal adenopathy  Musculoskeletal: Normal range of motion. He exhibits no edema.  No chest wall tenderness  Lymphadenopathy:    He has no cervical adenopathy.  Neurological: He is alert and oriented to person, place, and time. He has normal reflexes. No cranial nerve deficit.  Skin: Skin is warm and dry. No rash noted.  Psychiatric: He has a normal mood and affect. His  behavior is normal. Judgment and thought content normal.  Nursing note and vitals reviewed.   BP 134/79 (BP Location: Left Arm)   Pulse 63   Temp 97.7 F (36.5 C) (Oral)   Ht 6\' 3"  (1.905 m)   Wt 208 lb (94.3 kg)   BMI 26.00 kg/m        Assessment & Plan:  1. Hypothyroidism due to acquired atrophy of thyroid -Increase levothyroxine to 100 g daily and recheck thyroid profile in 6 weeks  2. Hyperlipidemia LDL goal <70 -Glycerides were very elevated and we will ask that he see the clinical pharmacist for further evaluation and management of triglycerides because of his history of coronary artery disease  3. Vitamin D deficiency -Continue current treatment  4. Gastroesophageal reflux disease, esophagitis presence not specified -Continue omeprazole but consider changing to ranitidine in the future 150 mg twice daily over-the-counter  5. Sneezing -.Using the overhead fan and use nasal saline frequently during the day -  fluticasone (FLONASE) 50 MCG/ACT nasal spray; Place 2 sprays into both nostrils daily.  Dispense: 16 g; Refill: 6  6. Abdominal bloating -Diminished and milk cheese ice cream and dairy products in the diet and start align one daily as a probiotic  7. Chronic idiopathic constipation -More water and less cheese ice cream and dairy products  8. Coronary artery disease due to lipid rich plaque -Follow-up with cardiology  Meds ordered this encounter  Medications  . levothyroxine (SYNTHROID, LEVOTHROID) 100 MCG tablet    Sig: Take 1 tablet (100 mcg total) by mouth daily.    Dispense:  90 tablet    Refill:  3  . fluticasone (FLONASE) 50 MCG/ACT nasal spray    Sig: Place 2 sprays into both nostrils daily.    Dispense:  16 g    Refill:  6  . Vitamin D, Ergocalciferol, (DRISDOL) 50000 units CAPS capsule    Sig: TAKE 1 CAPSULE EVERY WEEK    Dispense:  12 capsule    Refill:  1  . ranolazine (RANEXA) 500 MG 12 hr tablet    Sig: Take 1 tablet (500 mg total) by  mouth 2 (two) times daily.    Dispense:  180 tablet    Refill:  3  . rosuvastatin (CRESTOR) 20 MG tablet    Sig: TAKE ONE (1) TABLET EACH DAY    Dispense:  30 tablet    Refill:  3  . omeprazole (PRILOSEC) 20 MG capsule    Sig: TAKE TWO CAPSULES EACH DAY    Dispense:  180 capsule    Refill:  3   Patient Instructions                       Medicare Annual Wellness Visit  [facility] and the medical providers at [facility] strive to bring you the best medical care.  In doing so we not only want to address your current medical conditions and concerns but also to detect new conditions early and prevent illness, disease and health-related problems.    Medicare offers a yearly Wellness Visit which allows our clinical staff to assess your need for preventative services including immunizations, lifestyle education, counseling to decrease risk of preventable diseases and screening for fall risk and other medical concerns.    This visit is provided free of charge (no copay) for all Medicare recipients. The clinical pharmacists at [facility] have begun to conduct these Wellness Visits which will also include a thorough review of all your medications.    As you primary medical provider recommend that you make an appointment for your Annual Wellness Visit if you have not done so already this year.  You may set up this appointment before you leave today or you may call back [phone] and schedule an appointment.  Please make sure when you call that you mention that you are scheduling your Annual Wellness Visit with the clinical pharmacist so that the appointment may be made for the proper length of time.     Continue current medications. Continue good therapeutic lifestyle changes which include good diet and exercise. Fall precautions discussed with patient. If an FOBT was given today- please return it to our front desk. If you are over 67 years old - you  may need Prevnar 62 or the adult Pneumonia vaccine.  **Flu shots are available--- please call and schedule a FLU-CLINIC appointment**  After your visit with [place] today you will receive a survey  in the mail or online from [employer] regarding your care with [place]. Please take a moment to fill this out. Your feedback is very important to [place] as you can help [place] better understand your patient needs as well as improve your experience and satisfaction. WE CARE ABOUT YOU!!!   We will schedule you for a visit with the cardiologist because of your history of coronary artery disease and you have not had a follow-up in a good while We'll also schedule you for sleep apnea evaluation because of your daytime fatigue and history of nighttime snoring We'll also schedule you to meet with our clinical pharmacist to discuss your elevated triglyceride issues Do not forget to keep her appointment with your ophthalmologist Avoid milk cheese ice cream and dairy products and see if this  doesn't help your abdominal bloating and constipation Take align one daily For now continue with omeprazole but we may consider switching over to ranitidine in the future Avoid the use of overhead fans Continue with fluticasone and use nasal saline frequently in each nostril through the day  Arrie Senate MD

## 2016-02-07 DIAGNOSIS — H25813 Combined forms of age-related cataract, bilateral: Secondary | ICD-10-CM | POA: Diagnosis not present

## 2016-02-07 DIAGNOSIS — H524 Presbyopia: Secondary | ICD-10-CM | POA: Diagnosis not present

## 2016-02-07 DIAGNOSIS — H2512 Age-related nuclear cataract, left eye: Secondary | ICD-10-CM | POA: Diagnosis not present

## 2016-02-07 DIAGNOSIS — H35371 Puckering of macula, right eye: Secondary | ICD-10-CM | POA: Diagnosis not present

## 2016-02-07 DIAGNOSIS — H2511 Age-related nuclear cataract, right eye: Secondary | ICD-10-CM | POA: Diagnosis not present

## 2016-03-18 NOTE — Progress Notes (Signed)
Cardiology Office Note   Date:  03/19/2016   ID:  [name]., DOB September 09, 1950, MRN [id number]  PCP:  Redge Gainer, MD  Cardiologist:   Minus Breeding, MD   No chief complaint on file.     History of Present Illness: [name]. is a 66 y.o. male who presents for follow up of CAD.  The patient is new to me although he saw Dr. Verl Blalock a couple of years ago. He's had coronary disease with catheterizations in 2006 and 2008 demonstrating an occluded right coronary artery. He had nonobstructive disease in a diagonal. He was managed medically. His only symptom in 2006 was some dyspnea. However, he had abnormal stress test lead to catheterization. He never tolerated Imdur. He has been treated with Ranexa. For a while he was having trouble affording this but now is not having any issues and it does help. He's not been getting any chest discomfort.  The patient denies any new symptoms such as chest discomfort, neck or arm discomfort. There has been no new shortness of breath, PND or orthopnea. There have been no reported palpitations, presyncope or syncope.   Past Medical History:  Diagnosis Date  . Benign neoplasm of colon   . CAD (coronary artery disease)     Occluded RCA, 50% Diat 2006, 2008  . Diverticulosis of colon (without mention of hemorrhage)   . Esophageal reflux   . Other and unspecified hyperlipidemia   . Unspecified disorder of thyroid     Past Surgical History:  Procedure Laterality Date  . TONSILLECTOMY  1955     Current Outpatient Prescriptions  Medication Sig Dispense Refill  . aspirin 81 MG EC tablet Take 81 mg by mouth daily.      . fluticasone (FLONASE) 50 MCG/ACT nasal spray Place 2 sprays into both nostrils daily. 16 g 6  . levothyroxine (SYNTHROID, LEVOTHROID) 100 MCG tablet Take 1 tablet (100 mcg total) by mouth daily. 90 tablet 3  . Naproxen (NAPROXEN) 375 MG TBEC Take 1 tablet (375 mg total) by mouth 2 (two) times daily as needed. 30 each 0    . nitroGLYCERIN (NITROSTAT) 0.4 MG SL tablet Place 0.4 mg under the tongue every 5 (five) minutes as needed. Reported on 06/05/2015    . omeprazole (PRILOSEC) 20 MG capsule TAKE TWO CAPSULES EACH DAY 180 capsule 3  . ranolazine (RANEXA) 500 MG 12 hr tablet Take 1 tablet (500 mg total) by mouth 2 (two) times daily. 180 tablet 3  . rosuvastatin (CRESTOR) 20 MG tablet TAKE ONE (1) TABLET EACH DAY 30 tablet 3  . Vitamin D, Ergocalciferol, (DRISDOL) 50000 units CAPS capsule TAKE 1 CAPSULE EVERY WEEK 12 capsule 1   No current facility-administered medications for this visit.     Allergies:   Lipitor [atorvastatin]    ROS:  Please see the history of present illness.   Otherwise, review of systems are positive for none.   All other systems are reviewed and negative.    PHYSICAL EXAM: VS:  BP (!) 144/82   Pulse 66   Ht 6\' 3"  (1.905 m)   Wt 211 lb (95.7 kg)   BMI 26.37 kg/m  , BMI Body mass index is 26.37 kg/m. GENERAL:  Well appearing HEENT:  Pupils equal round and reactive, fundi not visualized, oral mucosa unremarkable NECK:  No jugular venous distention, waveform within normal limits, carotid upstroke brisk and symmetric, no bruits, no thyromegaly LUNGS:  Clear to auscultation bilaterally BACK:  No CVA tenderness CHEST:  Unremarkable HEART:  PMI not displaced or sustained,S1 and S2 within normal limits, no S3, no S4, no clicks, no rubs, no murmurs ABD:  Flat, positive bowel sounds normal in frequency in pitch, no bruits, no rebound, no guarding, no midline pulsatile mass, no hepatomegaly, no splenomegaly EXT:  2 plus pulses throughout, no edema, no cyanosis no clubbing   EKG:  EKG is ordered today. The ekg ordered today demonstrates sinus rhythm, rate 66, right axis deviation, no acute ST-T wave changes. 03/19/2016   Recent Labs: 01/21/2016: ALT 25; BUN 13; Creatinine, Ser 1.07; Platelets 247; Potassium 4.2; Sodium 139; TSH 4.910    Wt Readings from Last 3 Encounters:  03/19/16  211 lb (95.7 kg)  01/25/16 208 lb (94.3 kg)  06/19/15 202 lb (91.6 kg)    Lab Results  Component Value Date   CHOL 231 (H) 01/21/2016   TRIG 508 (H) 01/21/2016   HDL 41 01/21/2016   [place] Comment 01/21/2016   LDLDIRECT 124.9 01/18/2007     Other studies Reviewed: Additional studies/ records that were reviewed today include: Labs Review of the above records demonstrates:     ASSESSMENT AND PLAN:  CAD:  The patient has no new sypmtoms.  No further cardiovascular testing is indicated.  We will continue with aggressive risk reduction and meds as listed.  DYSLIPIDEMIA:  He was supposed to be seen in the lipid clinic at [facility].  His triglycerides are as above.  I will facilitate this.      Current medicines are reviewed at length with the patient today.  The patient does not have concerns regarding medicines.  The following changes have been made:  None  Labs/ tests ordered today include: None No orders of the defined types were placed in this encounter.    Disposition:   FU with me in 12 months    Signed, Minus Breeding, MD  03/19/2016 4:37 PM    [facility]

## 2016-03-19 ENCOUNTER — Encounter: Payer: Self-pay | Admitting: Cardiology

## 2016-03-19 ENCOUNTER — Ambulatory Visit (INDEPENDENT_AMBULATORY_CARE_PROVIDER_SITE_OTHER): Payer: Medicare HMO | Admitting: Cardiology

## 2016-03-19 VITALS — BP 144/82 | HR 66 | Ht 75.0 in | Wt 211.0 lb

## 2016-03-19 DIAGNOSIS — I2510 Atherosclerotic heart disease of native coronary artery without angina pectoris: Secondary | ICD-10-CM | POA: Diagnosis not present

## 2016-03-19 NOTE — Patient Instructions (Signed)
Medication Instructions:  The current medical regimen is effective;  continue present plan and medications.  You have been referred to the [facility] at [facility].  Please call there office to scheduled at appointment. Follow-Up: Follow up in 1 year with Dr. Percival Spanish.  You will receive a letter in the mail 2 months before you are due.  Please call us when you receive this letter to schedule your follow up appointment.  If you need a refill on your cardiac medications before your next appointment, please call your pharmacy.  Thank you for choosing [facility]!!

## 2016-03-20 ENCOUNTER — Institutional Professional Consult (permissible substitution): Payer: Medicare HMO | Admitting: Pulmonary Disease

## 2016-03-20 ENCOUNTER — Encounter: Payer: Self-pay | Admitting: Cardiology

## 2016-03-20 NOTE — Progress Notes (Signed)
Please make sure this patient gets an appointment with clinical pharmacy for elevated triglycerides

## 2016-04-07 DIAGNOSIS — H25812 Combined forms of age-related cataract, left eye: Secondary | ICD-10-CM | POA: Diagnosis not present

## 2016-04-07 DIAGNOSIS — H2512 Age-related nuclear cataract, left eye: Secondary | ICD-10-CM | POA: Diagnosis not present

## 2016-04-07 DIAGNOSIS — Z961 Presence of intraocular lens: Secondary | ICD-10-CM | POA: Diagnosis not present

## 2016-04-10 ENCOUNTER — Ambulatory Visit (INDEPENDENT_AMBULATORY_CARE_PROVIDER_SITE_OTHER): Payer: Medicare HMO | Admitting: Pharmacist

## 2016-04-10 DIAGNOSIS — R946 Abnormal results of thyroid function studies: Secondary | ICD-10-CM | POA: Diagnosis not present

## 2016-04-10 DIAGNOSIS — R7989 Other specified abnormal findings of blood chemistry: Secondary | ICD-10-CM

## 2016-04-10 DIAGNOSIS — E782 Mixed hyperlipidemia: Secondary | ICD-10-CM

## 2016-04-10 MED ORDER — FENOFIBRATE 48 MG PO TABS: 48.0000 mg | ORAL_TABLET | Freq: Every day | ORAL | 1 refills | Status: DC

## 2016-04-10 NOTE — Patient Instructions (Addendum)
  Increase non-starchy vegetables - carrots, green bean, squash, zucchini, tomatoes, onions, peppers, spinach and other green leafy vegetables, cabbage, lettuce, cucumbers, asparagus, okra (not fried), eggplant  Limit sugar and processed foods (cakes, cookies, ice cream, crackers and chips)  Increase fresh fruit but limit serving sizes 1/2 cup or about the size of tennis or baseball  Limit red meat to no more than 1-2 times per week (serving size about the size of your palm)  Choose whole grains / lean proteins - whole wheat bread, quinoa, whole grain rice (1/2 cup), fish, chicken, [place]  Avoid sugar and calorie containing beverages - soda, sweet tea and juice.  Choose water or unsweetened tea instead.               Start exercise - this will also help with exercise -  Recommend walking, weights, elliptical, stationary bike, swimming - goal is to exercise 150 minutes weekly.

## 2016-04-11 NOTE — Progress Notes (Signed)
Patient ID: [name]., male   DOB: [birth year], 66 y.o.   MRN: [id number]   Dyslipidemia  [name] has a long history of mixed dyslipidemia.  He is currently taking Rosuvastatin 20mg  qd.  The last 3 lipids panels have shown very elevated triglycerides.  He has taken low dose fenofibrate in the past and cannot recall exactly why this was stopped but he thinks it was either because he suspected myalgias or his cardiologist wasn't sure that he was getting much benefit from fenofibrate. Patient did have elevated TSH 01/2016 and thyroid medication was increased  Compliance with treatment thus far has been good. A repeat fasting lipid profile was not done today. The patient does not use medications that may worsen dyslipidemias (corticosteroids, progestins, anabolic steroids, diuretics, beta-blockers, amiodarone, cyclosporine, olanzapine). The patient exercises rarely.  The patient is known to have coexisting coronary artery disease.   Cardiac Risk Factors Age > 45-male, > 55-male:  YES  +1  Smoking:   NO  Sig. family hx of CHD*:  YES  +1  Hypertension:   NO  Diabetes:   NO  HDL < 35:   NO  HDL > 59:   NO  Total: 2  *- Significant family history of Coronary Heart Disease per National Cholesterol Education Program = Myocardial Infarction or sudden death at less than 20 years old in  father or other 1st-degree male relative, or less than 31 years old in mother or  other 1st-degree male relative.   Lipids Results:  Date LDL HDL Total chol. Tg  01/21/2016 N/A 41 213 508  06/19/2015 N/A 41 217 684  12/12/2014 N/A 38 233 696  10/15/2012 (on fenofibrate) 115 39 202 241    Assessment:  Dyslipidemia under inadequate control, with documented CAD  Target levels for LDL are: < 70 mg/dl ("very high" risk for CHD)  Explained to him the respective contributions of genetics, diet, and exercise to lipid levels and the use of medication in severe cases which do not respond to lifestyle  alteration. His interest and motivation in making lifestyle changes seems good.   Plan:  The following changes are planned for the next 3 months, at which time the patient will return for repeat fasting lipids:  1. Dietary changes: discussed reducing sugar and CHO intake as well as decreasing fried foods and fat 2. Exercise changes:  recommended increase physical activity - goal is 150 minutes per week  3. Lipid-lowering medications: Discussed various treatments for hypertriglyceridemia (omega 3 FA and fenofibrates, niacin)  Add back low dose fenofibrate 48mg  1 tablet daily  Continue rosuvastatin 20mg  daily 4.  Patient to monitor for myalgias and call office if experiences any problems 5.  RTC in 4-6 weeks to check lipids, direct LDL and thyroid panel  Note: The majority of the visit was spent in counseling on the pathophysiology and treatment of dyslipidemias. The total face-to-face time was in excess of 30 minutes.

## 2016-04-28 DIAGNOSIS — H25811 Combined forms of age-related cataract, right eye: Secondary | ICD-10-CM | POA: Diagnosis not present

## 2016-04-28 DIAGNOSIS — H2511 Age-related nuclear cataract, right eye: Secondary | ICD-10-CM | POA: Diagnosis not present

## 2016-05-06 ENCOUNTER — Institutional Professional Consult (permissible substitution): Payer: Medicare HMO | Admitting: Pulmonary Disease

## 2016-05-13 ENCOUNTER — Other Ambulatory Visit: Payer: Medicare HMO

## 2016-05-13 DIAGNOSIS — R7989 Other specified abnormal findings of blood chemistry: Secondary | ICD-10-CM

## 2016-05-13 DIAGNOSIS — R946 Abnormal results of thyroid function studies: Secondary | ICD-10-CM | POA: Diagnosis not present

## 2016-05-13 DIAGNOSIS — E782 Mixed hyperlipidemia: Secondary | ICD-10-CM

## 2016-05-14 LAB — LDL CHOLESTEROL, DIRECT: LDL DIRECT: 72 mg/dL (ref 0–99)

## 2016-05-14 LAB — LIPID PANEL
CHOL/HDL RATIO: 6.9 ratio — AB (ref 0.0–5.0)
CHOLESTEROL TOTAL: 235 mg/dL — AB (ref 100–199)
HDL: 34 mg/dL — ABNORMAL LOW (ref >39)
Triglycerides: 968 mg/dL (ref 0–149)

## 2016-05-14 LAB — THYROID PANEL WITH TSH
Free Thyroxine Index: 2.3 (ref 1.2–4.9)
T3 Uptake Ratio: 29 % (ref 24–39)
T4 TOTAL: 8 ug/dL (ref 4.5–12.0)
TSH: 2.81 u[IU]/mL (ref 0.450–4.500)

## 2016-05-15 ENCOUNTER — Telehealth: Payer: Self-pay | Admitting: Family Medicine

## 2016-05-16 NOTE — Telephone Encounter (Signed)
Labs reviewed with patient.  He is instructed to restart fenofibrate 48mg  daily Recheck lipids in 1 month

## 2016-05-29 ENCOUNTER — Telehealth: Payer: Self-pay | Admitting: Family Medicine

## 2016-05-29 NOTE — Telephone Encounter (Signed)
Patient aware that Dr. Laurance Flatten has no sooner appts open

## 2016-06-24 DIAGNOSIS — R69 Illness, unspecified: Secondary | ICD-10-CM | POA: Diagnosis not present

## 2016-06-27 ENCOUNTER — Encounter: Payer: Self-pay | Admitting: Family Medicine

## 2016-06-27 ENCOUNTER — Ambulatory Visit (INDEPENDENT_AMBULATORY_CARE_PROVIDER_SITE_OTHER): Payer: Medicare HMO | Admitting: Family Medicine

## 2016-06-27 VITALS — BP 126/76 | HR 53 | Temp 97.8°F | Ht 75.0 in | Wt 206.0 lb

## 2016-06-27 DIAGNOSIS — K760 Fatty (change of) liver, not elsewhere classified: Secondary | ICD-10-CM | POA: Diagnosis not present

## 2016-06-27 DIAGNOSIS — K449 Diaphragmatic hernia without obstruction or gangrene: Secondary | ICD-10-CM | POA: Diagnosis not present

## 2016-06-27 DIAGNOSIS — R198 Other specified symptoms and signs involving the digestive system and abdomen: Secondary | ICD-10-CM | POA: Diagnosis not present

## 2016-06-27 DIAGNOSIS — R109 Unspecified abdominal pain: Secondary | ICD-10-CM | POA: Diagnosis not present

## 2016-06-27 DIAGNOSIS — I700 Atherosclerosis of aorta: Secondary | ICD-10-CM | POA: Diagnosis not present

## 2016-06-27 DIAGNOSIS — K429 Umbilical hernia without obstruction or gangrene: Secondary | ICD-10-CM

## 2016-06-27 NOTE — Patient Instructions (Signed)
Restart omeprazole and take one daily We will arrange for you to have a CT scan of the abdomen and pelvis as a follow-up to one that was done 4 years ago because of the abnormalities that were found at that time which included a fatty liver and cardiac calcifications as well as a slight enlargement of the infrarenal abdominal aorta. If you ever develop any severe pain around the umbilicus you do have a hernia in this area and this would necessitate you going to the emergency room.

## 2016-06-27 NOTE — Progress Notes (Signed)
Subjective:    Patient ID: [name]., male    DOB: 1950/04/14, 66 y.o.   MRN: [id number]  HPI Patient here today for abdominal fullness and possible hernia. This was first noticed when he was exercising recently. He says is a certain amount of fullness in his abdomen. On reviewing his past imaging studies he had a CT scan of abdomen and pelvis act in June 2014 and this will be reviewed with him during the visit but it basically showed coronary artery calcifications atherosclerotic type changes of the aorta with focal infrarenal bulging measuring 2.8 x 2.3 cm. All showed showed diffuse fatty infiltration of the liver small hiatal hernia and an enlargement of the prostate gland. These results will be reviewed with him and he will be given a copy of this report for his records. He has seen the cardiologist and will see him again in about 9 months and sees him yearly. He denies any chest pain or shortness of breath. He denies any trouble with his stomach as far as vomiting or diarrhea or blood in the stool but says that when he takes omeprazole this seems to help his bowels move better and also helps the fullness in his stomach feel better. He says he's passing his water without problems.  Patient Active Problem List   Diagnosis Date Noted  . Diverticulosis of colon (without mention of hemorrhage)   . Hyperlipidemia LDL goal <70   . Benign neoplasm of colon   . COLONIC POLYPS 06/12/2007  . Hypothyroidism 06/12/2007  . Coronary artery disease 06/12/2007  . CAD 06/12/2007  . GERD 06/12/2007  . DIVERTICULOSIS, COLON 06/12/2007  . GUAIAC POSITIVE STOOL 06/12/2007   Outpatient Encounter Prescriptions as of 06/27/2016  Medication Sig  . aspirin 81 MG EC tablet Take 81 mg by mouth daily.    . fenofibrate (TRICOR) 48 MG tablet Take 1 tablet (48 mg total) by mouth daily.  . fluticasone (FLONASE) 50 MCG/ACT nasal spray Place 2 sprays into both nostrils daily.  Marland Kitchen levothyroxine (SYNTHROID,  LEVOTHROID) 100 MCG tablet Take 1 tablet (100 mcg total) by mouth daily.  . Naproxen (NAPROXEN) 375 MG TBEC Take 1 tablet (375 mg total) by mouth 2 (two) times daily as needed.  . nitroGLYCERIN (NITROSTAT) 0.4 MG SL tablet Place 0.4 mg under the tongue every 5 (five) minutes as needed. Reported on 06/05/2015  . omeprazole (PRILOSEC) 20 MG capsule TAKE TWO CAPSULES EACH DAY  . ranolazine (RANEXA) 500 MG 12 hr tablet Take 1 tablet (500 mg total) by mouth 2 (two) times daily.  . rosuvastatin (CRESTOR) 20 MG tablet TAKE ONE (1) TABLET EACH DAY  . Vitamin D, Ergocalciferol, (DRISDOL) 50000 units CAPS capsule TAKE 1 CAPSULE EVERY WEEK   No facility-administered encounter medications on file as of 06/27/2016.       Review of Systems  Constitutional: Negative.   HENT: Negative.   Eyes: Negative.   Respiratory: Negative.   Cardiovascular: Negative.   Gastrointestinal: Negative.        Discomfort and abdominal "fullness"  Endocrine: Negative.   Genitourinary: Negative.   Musculoskeletal: Negative.   Skin: Negative.   Allergic/Immunologic: Negative.   Neurological: Negative.   Hematological: Negative.   Psychiatric/Behavioral: Negative.        Objective:   Physical Exam  Constitutional: He is oriented to person, place, and time. He appears well-developed and well-nourished. No distress.  The patient is pleasant and alert and somewhat worried about the bulging of his abdominal  muscles in the ventral area when he does sit ups.  HENT:  Head: Normocephalic and atraumatic.  Eyes: Conjunctivae and EOM are normal. Pupils are equal, round, and reactive to light. Right eye exhibits no discharge. Left eye exhibits no discharge. No scleral icterus.  Neck: Normal range of motion.  Cardiovascular: Normal rate, regular rhythm, normal heart sounds and intact distal pulses.   No murmur heard. Pulmonary/Chest: Effort normal and breath sounds normal. No respiratory distress. He has no wheezes. He has no  rales.  Abdominal: Soft. Bowel sounds are normal. He exhibits no distension and no mass. There is no tenderness. There is no rebound and no guarding.  There is an umbilical hernia. There is no liver or spleen enlargement no masses palpable and no inguinal hernia present.  Genitourinary:  Genitourinary Comments: No inguinal hernias palpable and no external genitalia abnormalities noted.  Musculoskeletal: Normal range of motion. He exhibits no edema.  Lymphadenopathy:    He has no cervical adenopathy.  Neurological: He is alert and oriented to person, place, and time. He has normal reflexes. No cranial nerve deficit.  Skin: Skin is warm and dry. No rash noted.  Psychiatric: He has a normal mood and affect. His behavior is normal. Judgment and thought content normal.  Nursing note and vitals reviewed.   BP (!) 140/91 (BP Location: Left Arm)   Pulse (!) 59   Temp 97.8 F (36.6 C) (Oral)   Ht 6\' 3"  (1.905 m)   Wt 206 lb (93.4 kg)   BMI 25.75 kg/m        Assessment & Plan:  1. Abdominal fullness - CT ABDOMEN PELVIS W WO CONTRAST; Future  2. Abdominal discomfort -The patient should restart his omeprazole and take one daily before breakfast - CT ABDOMEN PELVIS W WO CONTRAST; Future  3. Fatty liver - CT ABDOMEN PELVIS W WO CONTRAST; Future  4. Aortic atherosclerosis (HCC) -Continue to follow-up with cardiology on a yearly basis as planned - [place]; Future  5. Umbilical hernia without obstruction and without gangrene -The patient was instructed that if he develops any severe pain in the umbilical area that he should go to the emergency room immediately. - CT ABDOMEN PELVIS W WO CONTRAST; Future  6. Hiatal hernia -Restart omeprazole  Patient Instructions  Restart omeprazole and take one daily We will arrange for you to have a CT scan of the abdomen and pelvis as a follow-up to one that was done 4 years ago because of the abnormalities that were found at  that time which included a fatty liver and cardiac calcifications as well as a slight enlargement of the infrarenal abdominal aorta. If you ever develop any severe pain around the umbilicus you do have a hernia in this area and this would necessitate you going to the emergency room.  Arrie Senate MD   .

## 2016-07-04 ENCOUNTER — Other Ambulatory Visit: Payer: Self-pay | Admitting: Family Medicine

## 2016-07-28 ENCOUNTER — Encounter: Payer: Self-pay | Admitting: Family Medicine

## 2016-07-28 ENCOUNTER — Ambulatory Visit (INDEPENDENT_AMBULATORY_CARE_PROVIDER_SITE_OTHER): Payer: Medicare HMO | Admitting: Family Medicine

## 2016-07-28 ENCOUNTER — Ambulatory Visit (INDEPENDENT_AMBULATORY_CARE_PROVIDER_SITE_OTHER): Payer: Medicare HMO

## 2016-07-28 VITALS — BP 120/66 | HR 55 | Temp 97.8°F | Ht 75.0 in | Wt 207.0 lb

## 2016-07-28 DIAGNOSIS — Z0000 Encounter for general adult medical examination without abnormal findings: Secondary | ICD-10-CM

## 2016-07-28 DIAGNOSIS — E034 Atrophy of thyroid (acquired): Secondary | ICD-10-CM | POA: Diagnosis not present

## 2016-07-28 DIAGNOSIS — R109 Unspecified abdominal pain: Secondary | ICD-10-CM

## 2016-07-28 DIAGNOSIS — K429 Umbilical hernia without obstruction or gangrene: Secondary | ICD-10-CM

## 2016-07-28 DIAGNOSIS — K219 Gastro-esophageal reflux disease without esophagitis: Secondary | ICD-10-CM | POA: Diagnosis not present

## 2016-07-28 DIAGNOSIS — I2510 Atherosclerotic heart disease of native coronary artery without angina pectoris: Secondary | ICD-10-CM | POA: Diagnosis not present

## 2016-07-28 DIAGNOSIS — B351 Tinea unguium: Secondary | ICD-10-CM

## 2016-07-28 DIAGNOSIS — E559 Vitamin D deficiency, unspecified: Secondary | ICD-10-CM | POA: Diagnosis not present

## 2016-07-28 DIAGNOSIS — R7989 Other specified abnormal findings of blood chemistry: Secondary | ICD-10-CM

## 2016-07-28 DIAGNOSIS — E782 Mixed hyperlipidemia: Secondary | ICD-10-CM | POA: Diagnosis not present

## 2016-07-28 DIAGNOSIS — R946 Abnormal results of thyroid function studies: Secondary | ICD-10-CM | POA: Diagnosis not present

## 2016-07-28 LAB — MICROSCOPIC EXAMINATION
EPITHELIAL CELLS (NON RENAL): NONE SEEN /HPF (ref 0–10)
RENAL EPITHEL UA: NONE SEEN /HPF

## 2016-07-28 LAB — URINALYSIS, COMPLETE
BILIRUBIN UA: NEGATIVE
Glucose, UA: NEGATIVE
KETONES UA: NEGATIVE
LEUKOCYTES UA: NEGATIVE
NITRITE UA: NEGATIVE
PH UA: 5.5 (ref 5.0–7.5)
SPEC GRAV UA: 1.025 (ref 1.005–1.030)
UUROB: 0.2 mg/dL (ref 0.2–1.0)

## 2016-07-28 NOTE — Progress Notes (Signed)
Subjective:    Patient ID: [name]., male    DOB: January 19, 1951, 66 y.o.   MRN: [id number]  HPI Pt here for follow up and management of chronic medical problems which includes hyperlipidemia and hypothyroid. He is taking medication regularly.The patient recently had a bout with abdominal discomfort and bloating and fullness. He is still not gotten his CT scan of the abdomen and pelvis with contrast and we will make sure that this happens within the next few days. He has no other complaints today. He is due for his regular physical exam. The patient denies any chest pain or shortness of breath. He is followed regularly by the cardiologist because of coronary artery disease. He sees him again toward the end of this year or the first of next year. He denies any shortness of breath. He continues to have some abdominal bloating and fullness. He is still awaiting the planned MRI with contrast. He denies any changes in his bowel habits and has not seen any blood in the stool or had any black tarry bowel movements. He is passing his water without problems other than nocturia. His eyes were examined in March of this year when he had cataracts in both eyes removed. He will get some additional lab work today to cough a minute what he is already had done recently.     Patient Active Problem List   Diagnosis Date Noted  . Diverticulosis of colon (without mention of hemorrhage)   . Hyperlipidemia LDL goal <70   . Benign neoplasm of colon   . COLONIC POLYPS 06/12/2007  . Hypothyroidism 06/12/2007  . Coronary artery disease 06/12/2007  . CAD 06/12/2007  . GERD 06/12/2007  . DIVERTICULOSIS, COLON 06/12/2007  . GUAIAC POSITIVE STOOL 06/12/2007   Outpatient Encounter Prescriptions as of 07/28/2016  Medication Sig  . aspirin 81 MG EC tablet Take 81 mg by mouth daily.    . fenofibrate (TRICOR) 48 MG tablet Take 1 tablet (48 mg total) by mouth daily.  . fluticasone (FLONASE) 50 MCG/ACT nasal spray  Place 2 sprays into both nostrils daily.  Marland Kitchen levothyroxine (SYNTHROID, LEVOTHROID) 100 MCG tablet Take 1 tablet (100 mcg total) by mouth daily.  . Naproxen (NAPROXEN) 375 MG TBEC Take 1 tablet (375 mg total) by mouth 2 (two) times daily as needed.  . nitroGLYCERIN (NITROSTAT) 0.4 MG SL tablet Place 0.4 mg under the tongue every 5 (five) minutes as needed. Reported on 06/05/2015  . omeprazole (PRILOSEC) 20 MG capsule TAKE TWO CAPSULES EACH DAY  . ranolazine (RANEXA) 500 MG 12 hr tablet Take 1 tablet (500 mg total) by mouth 2 (two) times daily.  . rosuvastatin (CRESTOR) 20 MG tablet TAKE ONE (1) TABLET EACH DAY  . Vitamin D, Ergocalciferol, (DRISDOL) 50000 units CAPS capsule TAKE 1 CAPSULE EVERY WEEK   No facility-administered encounter medications on file as of 07/28/2016.      Review of Systems  Constitutional: Negative.   HENT: Negative.   Eyes: Negative.   Respiratory: Negative.   Cardiovascular: Negative.   Gastrointestinal: Negative.   Endocrine: Negative.   Genitourinary: Negative.   Musculoskeletal: Negative.   Skin: Negative.   Allergic/Immunologic: Negative.   Neurological: Negative.   Hematological: Negative.   Psychiatric/Behavioral: Negative.        Objective:   Physical Exam  Constitutional: He is oriented to person, place, and time. He appears well-developed and well-nourished. No distress.  The patient is pleasant and alert.  HENT:  Head: Normocephalic and atraumatic.  Right Ear: External ear normal.  Left Ear: External ear normal.  Mouth/Throat: Oropharynx is clear and moist. No oropharyngeal exudate.  Nasal turbinate congestion bilaterally  Eyes: Conjunctivae and EOM are normal. Pupils are equal, round, and reactive to light. Right eye exhibits no discharge. Left eye exhibits no discharge. No scleral icterus.  Recent eye exam in March with cataract surgery  Neck: Normal range of motion. Neck supple. No thyromegaly present.  No bruits thyromegaly or anterior  cervical adenopathy  Cardiovascular: Normal rate, regular rhythm, normal heart sounds and intact distal pulses.   No murmur heard. The heart is 60/m with a regular rate and rhythm. The patient has a return appointment with his cardiologist sometime the end of this year the first of next year.  Pulmonary/Chest: Effort normal and breath sounds normal. No respiratory distress. He has no wheezes. He has no rales. He exhibits no tenderness.  Clear anteriorly and posteriorly and no axillary adenopathy  Abdominal: Soft. Bowel sounds are normal. He exhibits no mass. There is no tenderness. There is no rebound and no guarding.  Abdominal fullness without masses or organ enlargement bruits or inguinal adenopathy  Genitourinary: Rectum normal and penis normal.  Genitourinary Comments: The prostate is minimally enlarged if any with no lumps or masses. There were no rectal masses. There are no inguinal hernias palpable. The external genitalia were within normal limits.  Musculoskeletal: Normal range of motion. He exhibits no edema.  Lymphadenopathy:    He has no cervical adenopathy.  Neurological: He is alert and oriented to person, place, and time. He has normal reflexes. No cranial nerve deficit.  Skin: Skin is warm and dry. No rash noted.  There was some nail tinea on one of the toes of the right foot.  Psychiatric: He has a normal mood and affect. His behavior is normal. Judgment and thought content normal.  Nursing note and vitals reviewed.  BP 120/66 (BP Location: Left Arm)   Pulse (!) 55   Temp 97.8 F (36.6 C) (Oral)   Ht _0  (1.905 m)   Wt 207 lb (93.9 kg)   BMI 25.87 kg/m   Chest x-ray with results pending====      Assessment & Plan:  1. Mixed hyperlipidemia -This is been addressed by the clinical pharmacist. - BMP8+EGFR - CBC with Differential/Platelet - Hepatic function panel - DG Chest 2 View; Future  2. Hypothyroidism due to acquired atrophy of thyroid -Lab work is  pending. - CBC with Differential/Platelet  3. Vitamin D deficiency -Continue vitamin D replacement pending results of lab work - CBC with Differential/Platelet - VITAMIN D 25 Hydroxy (Vit-D Deficiency, Fractures)  4. Gastroesophageal reflux disease, esophagitis presence not specified -Continue current treatment pending results of MRI scan - CBC with Differential/Platelet - Hepatic function panel  5. Health care maintenance -Continue walking exercises and get MRI scan as planned - PSA, total and free - CBC with Differential/Platelet - Urinalysis, Complete - DG Chest 2 View; Future  6. Umbilical hernia without obstruction and without gangrene -The patient does have a ventral hernia as well as a umbilical hernia. This may be playing a role with some of his abdominal fullness. We are waiting results  of the MRI scan with contrast.  7. Abdominal discomfort -MRI scan with contrast  8. Elevated TSH -Make sure her thyroid tests have been checked in the past 6 months.  9. Coronary artery disease involving native coronary artery of native heart without angina pectoris -Follow-up with cardiology as planned  10. Tinea of nail -The patient will try Lamisil cream twice daily or an alternative would be Vicks VapoRub twice daily for nail tinea  Patient Instructions                       Medicare Annual Wellness Visit  [facility] and the medical providers at [facility] strive to bring you the best medical care.  In doing so we not only want to address your current medical conditions and concerns but also to detect new conditions early and prevent illness, disease and health-related problems.    Medicare offers a yearly Wellness Visit which allows our clinical staff to assess your need for preventative services including immunizations, lifestyle education, counseling to decrease risk of preventable diseases and screening for fall risk and other medical concerns.      This visit is provided free of charge (no copay) for all Medicare recipients. The clinical pharmacists at [facility] have begun to conduct these Wellness Visits which will also include a thorough review of all your medications.    As you primary medical provider recommend that you make an appointment for your Annual Wellness Visit if you have not done so already this year.  You may set up this appointment before you leave today or you may call back ([phone]) and schedule an appointment.  Please make sure when you call that you mention that you are scheduling your Annual Wellness Visit with the clinical pharmacist so that the appointment may be made for the proper length of time.     Continue current medications. Continue good therapeutic lifestyle changes which include good diet and exercise. Fall precautions discussed with patient. If an FOBT was given today- please return it to our front desk. If you are over 23 years old - you may need Prevnar 24 or the adult Pneumonia vaccine.  **Flu shots are available--- please call and schedule a FLU-CLINIC appointment**  After your visit with [place] today you will receive a survey in the mail or online from [employer] regarding your care with [place]. Please take a moment to fill this out. Your feedback is very important to [place] as you can help [place] better understand your patient needs as well as improve your experience and satisfaction. WE CARE ABOUT YOU!!!   We will try to make sure that the CT of the abdomen and pelvis with contrast get arranged and if you do not hear from [place] within a week of this date please call us back Try Lamisil over-the-counter twice daily or you can try Vicks VapoRub twice daily to the toenail that is involved    Arrie Senate MD

## 2016-07-28 NOTE — Patient Instructions (Addendum)
Medicare Annual Wellness Visit  [facility] and the medical providers at [facility] strive to bring you the best medical care.  In doing so we not only want to address your current medical conditions and concerns but also to detect new conditions early and prevent illness, disease and health-related problems.    Medicare offers a yearly Wellness Visit which allows our clinical staff to assess your need for preventative services including immunizations, lifestyle education, counseling to decrease risk of preventable diseases and screening for fall risk and other medical concerns.    This visit is provided free of charge (no copay) for all Medicare recipients. The clinical pharmacists at [facility] have begun to conduct these Wellness Visits which will also include a thorough review of all your medications.    As you primary medical provider recommend that you make an appointment for your Annual Wellness Visit if you have not done so already this year.  You may set up this appointment before you leave today or you may call back ([phone]) and schedule an appointment.  Please make sure when you call that you mention that you are scheduling your Annual Wellness Visit with the clinical pharmacist so that the appointment may be made for the proper length of time.     Continue current medications. Continue good therapeutic lifestyle changes which include good diet and exercise. Fall precautions discussed with patient. If an FOBT was given today- please return it to our front desk. If you are over 5 years old - you may need Prevnar 69 or the adult Pneumonia vaccine.  **Flu shots are available--- please call and schedule a FLU-CLINIC appointment**  After your visit with [place] today you will receive a survey in the mail or online from [employer] regarding your care with [place]. Please take a moment to fill this out. Your feedback is very  important to [place] as you can help [place] better understand your patient needs as well as improve your experience and satisfaction. WE CARE ABOUT YOU!!!   We will try to make sure that the CT of the abdomen and pelvis with contrast get arranged and if you do not hear from [place] within a week of this date please call us back Try Lamisil over-the-counter twice daily or you can try Vicks VapoRub twice daily to the toenail that is involved

## 2016-07-29 LAB — BMP8+EGFR
BUN / CREAT RATIO: 11 (ref 10–24)
BUN: 13 mg/dL (ref 8–27)
CALCIUM: 8.9 mg/dL (ref 8.6–10.2)
CO2: 21 mmol/L (ref 20–29)
CREATININE: 1.19 mg/dL (ref 0.76–1.27)
Chloride: 102 mmol/L (ref 96–106)
GFR calc non Af Amer: 63 mL/min/{1.73_m2} (ref >59)
GFR, EST AFRICAN AMERICAN: 73 mL/min/{1.73_m2} (ref >59)
Glucose: 110 mg/dL — ABNORMAL HIGH (ref 65–99)
Potassium: 4.4 mmol/L (ref 3.5–5.2)
Sodium: 140 mmol/L (ref 134–144)

## 2016-07-29 LAB — CBC WITH DIFFERENTIAL/PLATELET
BASOS ABS: 0 10*3/uL (ref 0.0–0.2)
Basos: 1 %
EOS (ABSOLUTE): 0.2 10*3/uL (ref 0.0–0.4)
EOS: 3 %
HEMATOCRIT: 39.3 % (ref 37.5–51.0)
HEMOGLOBIN: 12.8 g/dL — AB (ref 13.0–17.7)
IMMATURE GRANS (ABS): 0 10*3/uL (ref 0.0–0.1)
Immature Granulocytes: 0 %
LYMPHS ABS: 1.5 10*3/uL (ref 0.7–3.1)
LYMPHS: 25 %
MCH: 28.8 pg (ref 26.6–33.0)
MCHC: 32.6 g/dL (ref 31.5–35.7)
MCV: 89 fL (ref 79–97)
MONOCYTES: 8 %
Monocytes Absolute: 0.5 10*3/uL (ref 0.1–0.9)
NEUTROS ABS: 3.8 10*3/uL (ref 1.4–7.0)
Neutrophils: 63 %
Platelets: 232 10*3/uL (ref 150–379)
RBC: 4.44 x10E6/uL (ref 4.14–5.80)
RDW: 14.4 % (ref 12.3–15.4)
WBC: 6 10*3/uL (ref 3.4–10.8)

## 2016-07-29 LAB — PSA, TOTAL AND FREE
PSA, Free Pct: 17.3 %
PSA, Free: 0.38 ng/mL
Prostate Specific Ag, Serum: 2.2 ng/mL (ref 0.0–4.0)

## 2016-07-29 LAB — HEPATIC FUNCTION PANEL
ALBUMIN: 4.7 g/dL (ref 3.6–4.8)
ALK PHOS: 81 IU/L (ref 39–117)
ALT: 24 IU/L (ref 0–44)
AST: 20 IU/L (ref 0–40)
BILIRUBIN, DIRECT: 0.13 mg/dL (ref 0.00–0.40)
Bilirubin Total: 0.6 mg/dL (ref 0.0–1.2)
TOTAL PROTEIN: 7.3 g/dL (ref 6.0–8.5)

## 2016-07-29 LAB — SPECIMEN STATUS REPORT

## 2016-07-29 LAB — VITAMIN D 25 HYDROXY (VIT D DEFICIENCY, FRACTURES): Vit D, 25-Hydroxy: 34.7 ng/mL (ref 30.0–100.0)

## 2016-08-05 ENCOUNTER — Ambulatory Visit (HOSPITAL_COMMUNITY)
Admission: RE | Admit: 2016-08-05 | Discharge: 2016-08-05 | Disposition: A | Discharge status: Home / Self Care | Payer: Medicare HMO | Source: Ambulatory Visit | Attending: Family Medicine | Admitting: Family Medicine

## 2016-08-05 ENCOUNTER — Encounter: Payer: Self-pay | Admitting: Family Medicine

## 2016-08-05 DIAGNOSIS — I700 Atherosclerosis of aorta: Secondary | ICD-10-CM | POA: Diagnosis not present

## 2016-08-05 DIAGNOSIS — R109 Unspecified abdominal pain: Secondary | ICD-10-CM

## 2016-08-05 DIAGNOSIS — K429 Umbilical hernia without obstruction or gangrene: Secondary | ICD-10-CM

## 2016-08-05 DIAGNOSIS — K449 Diaphragmatic hernia without obstruction or gangrene: Secondary | ICD-10-CM | POA: Diagnosis not present

## 2016-08-05 DIAGNOSIS — R198 Other specified symptoms and signs involving the digestive system and abdomen: Secondary | ICD-10-CM | POA: Diagnosis not present

## 2016-08-05 DIAGNOSIS — K760 Fatty (change of) liver, not elsewhere classified: Secondary | ICD-10-CM

## 2016-08-05 MED ORDER — IOPAMIDOL (ISOVUE-300) INJECTION 61%
100.0000 mL | Freq: Once | INTRAVENOUS | Status: AC | PRN
  Administered 2016-08-05: 100 mL via INTRAVENOUS

## 2016-08-23 ENCOUNTER — Other Ambulatory Visit: Payer: Self-pay | Admitting: Family Medicine

## 2016-10-08 ENCOUNTER — Other Ambulatory Visit: Payer: Self-pay | Admitting: Family Medicine

## 2016-10-15 ENCOUNTER — Other Ambulatory Visit: Payer: Self-pay | Admitting: Family Medicine

## 2016-12-02 ENCOUNTER — Other Ambulatory Visit: Payer: Self-pay | Admitting: Family Medicine

## 2016-12-08 ENCOUNTER — Other Ambulatory Visit: Payer: Self-pay | Admitting: Family Medicine

## 2016-12-17 DIAGNOSIS — H40013 Open angle with borderline findings, low risk, bilateral: Secondary | ICD-10-CM | POA: Diagnosis not present

## 2016-12-17 DIAGNOSIS — H35371 Puckering of macula, right eye: Secondary | ICD-10-CM | POA: Diagnosis not present

## 2016-12-17 DIAGNOSIS — H26493 Other secondary cataract, bilateral: Secondary | ICD-10-CM | POA: Diagnosis not present

## 2016-12-17 DIAGNOSIS — H04123 Dry eye syndrome of bilateral lacrimal glands: Secondary | ICD-10-CM | POA: Diagnosis not present

## 2017-01-05 DIAGNOSIS — R69 Illness, unspecified: Secondary | ICD-10-CM | POA: Diagnosis not present

## 2017-01-21 ENCOUNTER — Other Ambulatory Visit: Payer: Self-pay | Admitting: Family Medicine

## 2017-01-23 ENCOUNTER — Other Ambulatory Visit: Payer: Self-pay | Admitting: Family Medicine

## 2017-01-26 NOTE — Telephone Encounter (Signed)
OV 01/27/17

## 2017-01-27 ENCOUNTER — Encounter: Payer: Self-pay | Admitting: Family Medicine

## 2017-01-27 ENCOUNTER — Ambulatory Visit (INDEPENDENT_AMBULATORY_CARE_PROVIDER_SITE_OTHER): Payer: Medicare HMO | Admitting: Family Medicine

## 2017-01-27 VITALS — BP 121/72 | HR 64 | Temp 97.0°F | Ht 75.0 in | Wt 208.0 lb

## 2017-01-27 DIAGNOSIS — E559 Vitamin D deficiency, unspecified: Secondary | ICD-10-CM | POA: Diagnosis not present

## 2017-01-27 DIAGNOSIS — E034 Atrophy of thyroid (acquired): Secondary | ICD-10-CM

## 2017-01-27 DIAGNOSIS — K219 Gastro-esophageal reflux disease without esophagitis: Secondary | ICD-10-CM

## 2017-01-27 DIAGNOSIS — K5730 Diverticulosis of large intestine without perforation or abscess without bleeding: Secondary | ICD-10-CM

## 2017-01-27 DIAGNOSIS — I700 Atherosclerosis of aorta: Secondary | ICD-10-CM | POA: Diagnosis not present

## 2017-01-27 DIAGNOSIS — I2510 Atherosclerotic heart disease of native coronary artery without angina pectoris: Secondary | ICD-10-CM

## 2017-01-27 DIAGNOSIS — E782 Mixed hyperlipidemia: Secondary | ICD-10-CM | POA: Diagnosis not present

## 2017-01-27 NOTE — Progress Notes (Signed)
Subjective:    Patient ID: [name], male    DOB: 1950-03-07, 66 y.o.   MRN: [id number]  HPI Pt here for follow up and management of chronic medical problems which includes hyperlipidemia and hypothyroid. He is taking medication regularly.  She has no complaints today and is requesting no refills.  He does have a history of atherosclerosis of the abdominal aorta or atherosclerotic heart disease hypothyroidism hyperlipidemia and GERD.  He is taking Crestor and fenofibrate thyroid replacement and using Prilosec for his GERD.  His vital signs are stable and his weight is similar to what it was previously.  The patient is doing well overall as mentioned.  He indicates he did not here the results of the CT scan we did of his abdomen and we will certainly apologize for this and review this report with him today during the visit.  He denies any chest pain or shortness of breath.  He denies any trouble with his stomach other than occasional reflux symptoms and this is usually related to something he has eaten.  He does not take the Prilosec regularly.  He denies any blood in the stool or black tarry bowel movements and no change in bowel habits and is passing his water without problems.  Since last colonoscopy report was reviewed with Dr. Sharlett Iles he had recommended that he have another colonoscopy in 3-5 years and that was almost 5 years ago.  We will make sure this gets arranged for this patient.  We also reviewed the CT scan results from the summer which basically showed a small hiatal hernia and umbilical hernia with some fat and no other abnormal findings.  We apologize for not letting him know sooner and normally we would have called him with this result.  He will take a copy of that report with him when he goes to get his colonoscopy.   Patient Active Problem List   Diagnosis Date Noted  . Abdominal aortic atherosclerosis ([place]) 08/05/2016  . Diverticulosis of colon (without mention of  hemorrhage)   . Hyperlipidemia LDL goal <70   . Benign neoplasm of colon   . COLONIC POLYPS 06/12/2007  . Hypothyroidism 06/12/2007  . Coronary artery disease 06/12/2007  . CAD 06/12/2007  . GERD 06/12/2007  . DIVERTICULOSIS, COLON 06/12/2007  . GUAIAC POSITIVE STOOL 06/12/2007   Outpatient Encounter Medications as of 01/27/2017  Medication Sig  . aspirin 81 MG EC tablet Take 81 mg by mouth daily.    . fenofibrate (TRICOR) 48 MG tablet Take 1 tablet (48 mg total) by mouth daily.  . fenofibrate 54 MG tablet TAKE ONE (1) TABLET EACH DAY  . fluticasone (FLONASE) 50 MCG/ACT nasal spray Place 2 sprays into both nostrils daily.  Marland Kitchen levothyroxine (SYNTHROID, LEVOTHROID) 100 MCG tablet TAKE ONE (1) TABLET EACH DAY  . Naproxen (NAPROXEN) 375 MG TBEC Take 1 tablet (375 mg total) by mouth 2 (two) times daily as needed.  . nitroGLYCERIN (NITROSTAT) 0.4 MG SL tablet Place 0.4 mg under the tongue every 5 (five) minutes as needed. Reported on 06/05/2015  . omeprazole (PRILOSEC) 20 MG capsule TAKE TWO CAPSULES EACH DAY  . RANEXA 500 MG 12 hr tablet TAKE ONE TABLET BY MOUTH TWICE DAILY  . rosuvastatin (CRESTOR) 20 MG tablet TAKE ONE (1) TABLET EACH DAY  . Vitamin D, Ergocalciferol, (DRISDOL) 50000 units CAPS capsule TAKE 1 CAPSULE EVERY WEEK   No facility-administered encounter medications on file as of 01/27/2017.  Review of Systems  Constitutional: Negative.   HENT: Negative.   Eyes: Negative.   Respiratory: Negative.   Cardiovascular: Negative.   Gastrointestinal: Negative.   Endocrine: Negative.   Genitourinary: Negative.   Musculoskeletal: Negative.   Skin: Negative.   Allergic/Immunologic: Negative.   Neurological: Negative.   Hematological: Negative.   Psychiatric/Behavioral: Negative.        Objective:   Physical Exam  Constitutional: He is oriented to person, place, and time. He appears well-developed and well-nourished. No distress.  Patient is pleasant and alert and  calm  HENT:  Head: Normocephalic and atraumatic.  Right Ear: External ear normal.  Left Ear: External ear normal.  Mouth/Throat: Oropharynx is clear and moist. No oropharyngeal exudate.  Slight nasal congestion bilaterally  Eyes: Conjunctivae and EOM are normal. Pupils are equal, round, and reactive to light. Right eye exhibits no discharge. Left eye exhibits no discharge. No scleral icterus.  Neck: Normal range of motion. Neck supple. No thyromegaly present.  No bruits thyromegaly or anterior cervical adenopathy  Cardiovascular: Normal rate, regular rhythm, normal heart sounds and intact distal pulses.  No murmur heard. Heart has a regular rate and rhythm at 60/min  Pulmonary/Chest: Effort normal and breath sounds normal. No respiratory distress. He has no wheezes. He has no rales. He exhibits no tenderness.  Clear anteriorly and posteriorly without axillary adenopathy or chest wall tenderness or masses  Abdominal: Soft. Bowel sounds are normal. He exhibits no mass. There is no tenderness. There is no rebound and no guarding.  The abdomen is non-tender without liver or spleen enlargement.  No obvious umbilical hernia was palpable.  There is no inguinal adenopathy.  Musculoskeletal: Normal range of motion. He exhibits no edema.  Lymphadenopathy:    He has no cervical adenopathy.  Neurological: He is alert and oriented to person, place, and time. He has normal reflexes. No cranial nerve deficit.  Skin: Skin is warm and dry. No rash noted.  Psychiatric: He has a normal mood and affect. His behavior is normal. Judgment and thought content normal.  Nursing note and vitals reviewed.   BP 121/72 (BP Location: Left Arm)   Pulse 64   Temp (!) 97 F (36.1 C) (Oral)   Ht '6\' 3"'$  (1.905 m)   Wt 208 lb (94.3 kg)   BMI 26.00 kg/m        Assessment & Plan:  1. Vitamin D deficiency -Continue current treatment pending results of lab work - CBC with Differential/Platelet - VITAMIN D 25 Hydroxy  (Vit-D Deficiency, Fractures)  2. Mixed hyperlipidemia -Continue current treatment pending results of lab work along with aggressive therapeutic lifestyle changes and less ice cream. - BMP8+EGFR - CBC with Differential/Platelet - Lipid panel - Hepatic function panel  3. Hypothyroidism due to acquired atrophy of thyroid -Continue current treatment pending results of lab work - Thyroid Panel With TSH - CBC with Differential/Platelet  4. Gastroesophageal reflux disease, esophagitis presence not specified -Continue diet measures of avoiding fried foods greasy foods and highly spicy foods.  Continue with omeprazole 1 daily every morning upon first arising. - CBC with Differential/Platelet - Hepatic function panel  5. Coronary artery disease involving native coronary artery of native heart without angina pectoris -Continue with yearly follow-up with cardiology  6. Diverticulosis of colon -No particular complaints with this but after last colonoscopy about 5 years ago it was requested he have another colonoscopy in 3-5 years and we will make sure that he complies with this and set him up  for an appointment with a gastroenterologist.  7. Abdominal aortic atherosclerosis ([place]) -Continue as aggressive therapeutic lifestyle changes as possible which include diet and exercise and Crestor.  No orders of the defined types were placed in this encounter.  Patient Instructions                       Medicare Annual Wellness Visit  [facility] and the medical providers at [facility] strive to bring you the best medical care.  In doing so we not only want to address your current medical conditions and concerns but also to detect new conditions early and prevent illness, disease and health-related problems.    Medicare offers a yearly Wellness Visit which allows our clinical staff to assess your need for preventative services including immunizations, lifestyle education,  counseling to decrease risk of preventable diseases and screening for fall risk and other medical concerns.    This visit is provided free of charge (no copay) for all Medicare recipients. The clinical pharmacists at [facility] have begun to conduct these Wellness Visits which will also include a thorough review of all your medications.    As you primary medical provider recommend that you make an appointment for your Annual Wellness Visit if you have not done so already this year.  You may set up this appointment before you leave today or you may call back [phone]) and schedule an appointment.  Please make sure when you call that you mention that you are scheduling your Annual Wellness Visit with the clinical pharmacist so that the appointment may be made for the proper length of time.     Continue current medications. Continue good therapeutic lifestyle changes which include good diet and exercise. Fall precautions discussed with patient. If an FOBT was given today- please return it to our front desk. If you are over 16 years old - you may need Prevnar 52 or the adult Pneumonia vaccine.  **Flu shots are available--- please call and schedule a FLU-CLINIC appointment**  After your visit with [place] today you will receive a survey in the mail or online from [employer] regarding your care with [place]. Please take a moment to fill this out. Your feedback is very important to [place] as you can help [place] better understand your patient needs as well as improve your experience and satisfaction. WE CARE ABOUT YOU!!!  Continue to follow-up with cardiology on a yearly basis We will arrange for you to have a visit with a gastroenterologist for a colonoscopy as requested by Dr. Sharlett Iles who has now retired We will call with lab work results as soon as available Avoid milk cheese ice cream and dairy products as much as possible is eating these types of foods can cause more gas and  bloating   Arrie Senate MD

## 2017-01-27 NOTE — Patient Instructions (Addendum)
Medicare Annual Wellness Visit  [facility] and the medical providers at [facility] strive to bring you the best medical care.  In doing so we not only want to address your current medical conditions and concerns but also to detect new conditions early and prevent illness, disease and health-related problems.    Medicare offers a yearly Wellness Visit which allows our clinical staff to assess your need for preventative services including immunizations, lifestyle education, counseling to decrease risk of preventable diseases and screening for fall risk and other medical concerns.    This visit is provided free of charge (no copay) for all Medicare recipients. The clinical pharmacists at [facility] have begun to conduct these Wellness Visits which will also include a thorough review of all your medications.    As you primary medical provider recommend that you make an appointment for your Annual Wellness Visit if you have not done so already this year.  You may set up this appointment before you leave today or you may call back [phone]) and schedule an appointment.  Please make sure when you call that you mention that you are scheduling your Annual Wellness Visit with the clinical pharmacist so that the appointment may be made for the proper length of time.     Continue current medications. Continue good therapeutic lifestyle changes which include good diet and exercise. Fall precautions discussed with patient. If an FOBT was given today- please return it to our front desk. If you are over 43 years old - you may need Prevnar 71 or the adult Pneumonia vaccine.  **Flu shots are available--- please call and schedule a FLU-CLINIC appointment**  After your visit with [place] today you will receive a survey in the mail or online from [employer] regarding your care with [place]. Please take a moment to fill this out. Your feedback is very  important to [place] as you can help [place] better understand your patient needs as well as improve your experience and satisfaction. WE CARE ABOUT YOU!!!  Continue to follow-up with cardiology on a yearly basis We will arrange for you to have a visit with a gastroenterologist for a colonoscopy as requested by Dr. Sharlett Iles who has now retired We will call with lab work results as soon as available Avoid milk cheese ice cream and dairy products as much as possible is eating these types of foods can cause more gas and bloating

## 2017-01-28 LAB — BMP8+EGFR
BUN / CREAT RATIO: 10 (ref 10–24)
BUN: 12 mg/dL (ref 8–27)
CHLORIDE: 102 mmol/L (ref 96–106)
CO2: 22 mmol/L (ref 20–29)
Calcium: 9.6 mg/dL (ref 8.6–10.2)
Creatinine, Ser: 1.2 mg/dL (ref 0.76–1.27)
GFR calc Af Amer: 71 mL/min/{1.73_m2} (ref >59)
GFR, EST NON AFRICAN AMERICAN: 62 mL/min/{1.73_m2} (ref >59)
Glucose: 107 mg/dL — ABNORMAL HIGH (ref 65–99)
POTASSIUM: 4 mmol/L (ref 3.5–5.2)
Sodium: 139 mmol/L (ref 134–144)

## 2017-01-28 LAB — LIPID PANEL
CHOL/HDL RATIO: 5.9 ratio — AB (ref 0.0–5.0)
CHOLESTEROL TOTAL: 218 mg/dL — AB (ref 100–199)
HDL: 37 mg/dL — ABNORMAL LOW (ref >39)
Triglycerides: 485 mg/dL — ABNORMAL HIGH (ref 0–149)

## 2017-01-28 LAB — CBC WITH DIFFERENTIAL/PLATELET
BASOS ABS: 0 10*3/uL (ref 0.0–0.2)
BASOS: 1 %
EOS (ABSOLUTE): 0.2 10*3/uL (ref 0.0–0.4)
Eos: 4 %
HEMOGLOBIN: 13.7 g/dL (ref 13.0–17.7)
Hematocrit: 41 % (ref 37.5–51.0)
IMMATURE GRANS (ABS): 0 10*3/uL (ref 0.0–0.1)
Immature Granulocytes: 0 %
LYMPHS ABS: 1.8 10*3/uL (ref 0.7–3.1)
Lymphs: 31 %
MCH: 28.6 pg (ref 26.6–33.0)
MCHC: 33.4 g/dL (ref 31.5–35.7)
MCV: 86 fL (ref 79–97)
MONOCYTES: 8 %
Monocytes Absolute: 0.4 10*3/uL (ref 0.1–0.9)
NEUTROS ABS: 3.3 10*3/uL (ref 1.4–7.0)
Neutrophils: 56 %
Platelets: 282 10*3/uL (ref 150–379)
RBC: 4.79 x10E6/uL (ref 4.14–5.80)
RDW: 14.4 % (ref 12.3–15.4)
WBC: 5.9 10*3/uL (ref 3.4–10.8)

## 2017-01-28 LAB — THYROID PANEL WITH TSH
FREE THYROXINE INDEX: 2.2 (ref 1.2–4.9)
T3 UPTAKE RATIO: 27 % (ref 24–39)
T4, Total: 8 ug/dL (ref 4.5–12.0)
TSH: 3.58 u[IU]/mL (ref 0.450–4.500)

## 2017-01-28 LAB — VITAMIN D 25 HYDROXY (VIT D DEFICIENCY, FRACTURES): VIT D 25 HYDROXY: 35.7 ng/mL (ref 30.0–100.0)

## 2017-01-28 LAB — HEPATIC FUNCTION PANEL
ALBUMIN: 4.9 g/dL — AB (ref 3.6–4.8)
ALT: 31 IU/L (ref 0–44)
AST: 25 IU/L (ref 0–40)
Alkaline Phosphatase: 74 IU/L (ref 39–117)
Bilirubin Total: 0.5 mg/dL (ref 0.0–1.2)
Bilirubin, Direct: 0.12 mg/dL (ref 0.00–0.40)
TOTAL PROTEIN: 7.9 g/dL (ref 6.0–8.5)

## 2017-02-16 ENCOUNTER — Other Ambulatory Visit: Payer: Self-pay | Admitting: Family Medicine

## 2017-02-17 NOTE — Telephone Encounter (Signed)
Last Vit D 12/18   35.7

## 2017-03-06 ENCOUNTER — Other Ambulatory Visit: Payer: Self-pay | Admitting: Family Medicine

## 2017-03-06 DIAGNOSIS — R067 Sneezing: Secondary | ICD-10-CM

## 2017-03-16 ENCOUNTER — Encounter: Payer: Self-pay | Admitting: Gastroenterology

## 2017-03-25 ENCOUNTER — Ambulatory Visit (INDEPENDENT_AMBULATORY_CARE_PROVIDER_SITE_OTHER): Payer: Medicare HMO | Admitting: Family Medicine

## 2017-03-25 ENCOUNTER — Encounter: Payer: Self-pay | Admitting: Family Medicine

## 2017-03-25 ENCOUNTER — Other Ambulatory Visit: Payer: Self-pay | Admitting: Pediatrics

## 2017-03-25 VITALS — BP 161/95 | HR 70 | Temp 98.8°F | Ht 75.0 in | Wt 213.0 lb

## 2017-03-25 DIAGNOSIS — B9789 Other viral agents as the cause of diseases classified elsewhere: Secondary | ICD-10-CM | POA: Diagnosis not present

## 2017-03-25 DIAGNOSIS — J069 Acute upper respiratory infection, unspecified: Secondary | ICD-10-CM | POA: Diagnosis not present

## 2017-03-25 MED ORDER — GUAIFENESIN-CODEINE 100-10 MG/5ML PO SOLN: 5.0000 mL | Freq: Four times a day (QID) | ORAL | 0 refills | Status: DC | PRN

## 2017-03-25 NOTE — Patient Instructions (Signed)
As we discussed, there is no evidence of bacterial infection on your exam today.  I have prescribed the liquid cough medication containing codeine that you had last time.  Please note that this actually does have guaifenesin in it and that you do not need to take the over-the-counter pill version of the guaifenesin in addition to this cough medication.  Below are some recommendations for safe cough and cold medications in the setting of high blood pressure.  It appears that you have a viral upper respiratory infection (cold).  Cold symptoms can last up to 2 weeks.  I recommend that you only use cold medications that are safe in high blood pressure like Coricidin (generic is fine).  Other cold medications can increase your blood pressure.    - Get plenty of rest and drink plenty of fluids. - Try to breathe moist air. Use a cold mist humidifier. - Consume warm fluids (soup or tea) to provide relief for a stuffy nose and to loosen phlegm. - For nasal stuffiness, try saline nasal spray or a Neti Pot.  Afrin nasal spray can also be used but this product should not be used longer than 3 days or it will cause rebound nasal stuffiness (worsening nasal congestion). - For sore throat pain relief: suck on throat lozenges, hard candy or popsicles; gargle with warm salt water (1/4 tsp. salt per 8 oz. of water); and eat soft, bland foods. - Eat a well-balanced diet. If you cannot, ensure you are getting enough nutrients by taking a daily multivitamin. - Avoid dairy products, as they can thicken phlegm. - Avoid alcohol, as it impairs your body's immune system.  CONTACT YOUR DOCTOR IF YOU EXPERIENCE ANY OF THE FOLLOWING: - High fever - Ear pain - Sinus-type headache - Unusually severe cold symptoms - Cough that gets worse while other cold symptoms improve - Flare up of any chronic lung problem, such as asthma - Your symptoms persist longer than 2 weeks

## 2017-03-25 NOTE — Progress Notes (Signed)
Subjective: CC: cough PCP: Chipper Herb, MD [id number] Fermin Schwab. is a 67 y.o. male presenting to clinic today for:  1. Cough Patient reports onset of dry cough yesterday.  He denies any sick contacts.  He denies any associated rhinorrhea, nasal congestion, fevers, chills, hemoptysis, shortness of breath, wheezing.  No recent travel.  He has been using Alka-Seltzer plus with little improvement in symptoms.  He had a couple of doses of Robitussin-AC leftover from a couple of years ago which seemed to work well to suppress cough.   ROS: Per HPI  Allergies  Allergen Reactions  . Lipitor [Atorvastatin] Other (See Comments)    myalgias   Past Medical History:  Diagnosis Date  . Benign neoplasm of colon   . CAD (coronary artery disease)     [place] RCA, 50% [place] 2006, 2008  . Diverticulosis of colon (without mention of hemorrhage)   . Esophageal reflux   . Other and unspecified hyperlipidemia   . Unspecified disorder of thyroid     Current Outpatient Medications:  .  aspirin 81 MG EC tablet, Take 81 mg by mouth daily.  , Disp: , Rfl:  .  fenofibrate (TRICOR) 48 MG tablet, Take 1 tablet (48 mg total) by mouth daily., Disp: 30 tablet, Rfl: 1 .  fenofibrate 54 MG tablet, TAKE ONE (1) TABLET EACH DAY, Disp: 90 tablet, Rfl: 0 .  fluticasone (FLONASE) 50 MCG/ACT nasal spray, USE 2 SPRAYS IN EACH NOSTRIL DAILY, Disp: 16 g, Rfl: 4 .  levothyroxine (SYNTHROID, LEVOTHROID) 100 MCG tablet, TAKE ONE (1) TABLET EACH DAY, Disp: 90 tablet, Rfl: 1 .  Naproxen (NAPROXEN) 375 MG TBEC, Take 1 tablet (375 mg total) by mouth 2 (two) times daily as needed., Disp: 30 each, Rfl: 0 .  nitroGLYCERIN (NITROSTAT) 0.4 MG SL tablet, Place 0.4 mg under the tongue every 5 (five) minutes as needed. Reported on 06/05/2015, Disp: , Rfl:  .  omeprazole (PRILOSEC) 20 MG capsule, TAKE TWO CAPSULES EACH DAY, Disp: 180 capsule, Rfl: 3 .  RANEXA 500 MG 12 hr tablet, TAKE ONE TABLET BY MOUTH TWICE DAILY, Disp: 180  tablet, Rfl: 1 .  rosuvastatin (CRESTOR) 20 MG tablet, TAKE ONE (1) TABLET EACH DAY, Disp: 90 tablet, Rfl: 0 .  Vitamin D, Ergocalciferol, (DRISDOL) 50000 units CAPS capsule, TAKE 1 CAPSULE EVERY WEEK, Disp: 12 capsule, Rfl: 0 .  guaiFENesin-codeine 100-10 MG/5ML syrup, Take 5 mLs by mouth every 6 (six) hours as needed for cough., Disp: 120 mL, Rfl: 0 Social History   Socioeconomic History  . Marital status: Widowed    Spouse name: Not on file  . Number of children: Not on file  . Years of education: Not on file  . Highest education level: Not on file  Social Needs  . Financial resource strain: Not on file  . Food insecurity - worry: Not on file  . Food insecurity - inability: Not on file  . Transportation needs - medical: Not on file  . Transportation needs - non-medical: Not on file  Occupational History  . Not on file  Tobacco Use  . Smoking status: Former Smoker    Packs/day: 0.50    Types: Cigarettes    Last attempt to quit: 07/22/1986    Years since quitting: 30.6  . Smokeless tobacco: Never Used  Substance and Sexual Activity  . Alcohol use: Yes    Alcohol/week: 0.6 oz    Types: 1 Cans of beer per week  . Drug use: No  .  Sexual activity: Not on file  Other Topics Concern  . Not on file  Social History Narrative  . Not on file   Family History  Problem Relation Age of Onset  . Colon cancer Neg Hx     Objective: Office vital signs reviewed. BP (!) 161/95   Pulse 70   Temp 98.8 F (37.1 C) (Oral)   Ht 6\' 3"  (1.905 m)   Wt 213 lb (96.6 kg)   BMI 26.62 kg/m   Physical Examination:  General: Awake, alert, well nourished, nontoxic, No acute distress HEENT: Normal    Neck: No masses palpated. No lymphadenopathy    Ears: Tympanic membranes intact, normal light reflex, no erythema, no bulging    Eyes: PERRLA, extraocular membranes intact, sclera white    Nose: nasal turbinates moist, no nasal discharge    Throat: moist mucus membranes, no erythema, no  tonsillar exudate.  Airway is patent Cardio: regular rate and rhythm, S1S2 heard, no murmurs appreciated Pulm: clear to auscultation bilaterally, no wheezes, rhonchi or rales; normal work of breathing on room air  Assessment/ Plan: 67 y.o. male   1. Viral URI with cough Working diagnosis is viral URI versus bronchospasm secondary to season change.  I have refilled his Robitussin-AC.  Home care injections reviewed with the patient.  Handout was provided.  Caution sedation with codeine-containing product.  Strict return precautions and reasons for emergent evaluation reviewed with patient.  I did recommend that he follow-up in 2 weeks with his primary care doctor to recheck blood pressure as this was out of range during today's office visit.  I also advised him to use products only intended for high blood pressure to manage his cough and cold symptoms.  He was good understanding will follow-up as directed.    Meds ordered this encounter  Medications  . guaiFENesin-codeine 100-10 MG/5ML syrup    Sig: Take 5 mLs by mouth every 6 (six) hours as needed for cough.    Dispense:  120 mL    Refill:  [place], DO [facility] [id number]

## 2017-04-01 ENCOUNTER — Telehealth: Payer: Self-pay | Admitting: Family Medicine

## 2017-04-01 ENCOUNTER — Other Ambulatory Visit: Payer: Self-pay | Admitting: Family Medicine

## 2017-04-01 MED ORDER — AZITHROMYCIN 250 MG PO TABS: ORAL_TABLET | ORAL | 0 refills | Status: DC

## 2017-04-01 NOTE — Telephone Encounter (Signed)
PT was seen last week by Lajuana Ripple and he is not getting better. PT state that he is not feeling better states he is having chest congestion, cough, wheezing. Not coughing up any mucus and not blowing out any mucus. Its like he can feel it in his chest but can't get it coughed up. If something is called in please use [facility] in [place].

## 2017-04-01 NOTE — Telephone Encounter (Signed)
Aware. 

## 2017-04-01 NOTE — Telephone Encounter (Signed)
No QT issues.  Zpak sent to pharmacy.  Please have patient follow up with PCP as directed.

## 2017-04-01 NOTE — Telephone Encounter (Signed)
Please advise 

## 2017-04-03 ENCOUNTER — Other Ambulatory Visit: Payer: Self-pay | Admitting: Family Medicine

## 2017-04-24 ENCOUNTER — Other Ambulatory Visit: Payer: Self-pay | Admitting: Family Medicine

## 2017-05-02 ENCOUNTER — Other Ambulatory Visit: Payer: Self-pay | Admitting: Family Medicine

## 2017-05-22 ENCOUNTER — Other Ambulatory Visit: Payer: Self-pay | Admitting: Family Medicine

## 2017-05-23 ENCOUNTER — Other Ambulatory Visit: Payer: Self-pay | Admitting: Nurse Practitioner

## 2017-05-25 NOTE — Telephone Encounter (Signed)
Last Vit D 01/27/17  35.7  DWM 

## 2017-06-06 ENCOUNTER — Other Ambulatory Visit: Payer: Self-pay | Admitting: Family Medicine

## 2017-07-14 DIAGNOSIS — R69 Illness, unspecified: Secondary | ICD-10-CM | POA: Diagnosis not present

## 2017-07-20 ENCOUNTER — Telehealth: Payer: Self-pay | Admitting: Family Medicine

## 2017-07-20 NOTE — Telephone Encounter (Signed)
Pt called appt made for here this week

## 2017-07-24 ENCOUNTER — Encounter: Payer: Self-pay | Admitting: Family Medicine

## 2017-07-24 ENCOUNTER — Ambulatory Visit (INDEPENDENT_AMBULATORY_CARE_PROVIDER_SITE_OTHER): Payer: Medicare HMO | Admitting: Family Medicine

## 2017-07-24 VITALS — BP 140/77 | HR 59 | Temp 97.5°F | Ht 75.0 in | Wt 209.0 lb

## 2017-07-24 DIAGNOSIS — M722 Plantar fascial fibromatosis: Secondary | ICD-10-CM

## 2017-07-24 DIAGNOSIS — H6983 Other specified disorders of Eustachian tube, bilateral: Secondary | ICD-10-CM | POA: Diagnosis not present

## 2017-07-24 DIAGNOSIS — H61893 Other specified disorders of external ear, bilateral: Secondary | ICD-10-CM | POA: Diagnosis not present

## 2017-07-24 MED ORDER — CIPROFLOXACIN-DEXAMETHASONE 0.3-0.1 % OT SUSP: 4.0000 [drp] | Freq: Two times a day (BID) | OTIC | 1 refills | Status: DC

## 2017-07-24 NOTE — Patient Instructions (Signed)
Great to meet you! 

## 2017-07-24 NOTE — Progress Notes (Signed)
   HPI  Patient presents today here with ear fullness   Patient explains that he has had right ear fullness off and on for years.  He states that about 2 weeks ago he went on a long plane ride and had some popping in his ears, after that he had left ear fullness as well. He also has irritation and itching of his ear canals. He denies any decreased hearing Denies fever, chills, sweats.  Patient does use Flonase.  Oh By the way, patient also states he has plantar fasciitis that is been bothering him for 4 to 6 weeks. He has tried an insert in his shoe, some foot soaks but no ice or stretching.  PMH: Smoking status noted ROS: Per HPI  Objective: BP 140/77   Pulse (!) 59   Temp (!) 97.5 F (36.4 C) (Oral)   Ht 6\' 3"  (1.905 m)   Wt 209 lb (94.8 kg)   BMI 26.12 kg/m  Gen: NAD, alert, cooperative with exam HEENT: NCAT, no significant effusion bilaterally, he does have nasal mucosa bogginess and swelling, oropharynx moist and clear CV: RRR, good S1/S2, no murmur Resp: CTABL, no wheezes, non-labored Ext: No edema, warm Neuro: Alert and oriented, No gross deficits  Assessment and plan:  #eustacian tube dysfunction Continue Flonase Patient also has some irritation of the external canal, however there is no clear otitis externa Ciprodex drops for comfort  Plantar fascitis  Conservative therapy- ice stretches.  Not examined Handout from sports medicine patient advisor    No orders of the defined types were placed in this encounter.   Meds ordered this encounter  Medications  . ciprofloxacin-dexamethasone (CIPRODEX) OTIC suspension    Sig: Place 4 drops into both ears 2 (two) times daily.    Dispense:  7.5 mL    Refill:  [place], MD [facility] 07/24/2017, 4:16 PM

## 2017-07-29 ENCOUNTER — Ambulatory Visit: Payer: Medicare HMO | Admitting: Family Medicine

## 2017-08-14 ENCOUNTER — Other Ambulatory Visit: Payer: Self-pay | Admitting: Family Medicine

## 2017-08-17 NOTE — Telephone Encounter (Signed)
Last lipid 01/27/17  DWM

## 2017-08-31 ENCOUNTER — Other Ambulatory Visit: Payer: Self-pay | Admitting: Nurse Practitioner

## 2017-09-01 NOTE — Telephone Encounter (Signed)
Last Vit D 01/27/17  35.7  DWM

## 2017-09-02 ENCOUNTER — Ambulatory Visit (INDEPENDENT_AMBULATORY_CARE_PROVIDER_SITE_OTHER): Payer: Medicare HMO | Admitting: Family Medicine

## 2017-09-02 ENCOUNTER — Ambulatory Visit (INDEPENDENT_AMBULATORY_CARE_PROVIDER_SITE_OTHER): Payer: Medicare HMO

## 2017-09-02 ENCOUNTER — Encounter: Payer: Self-pay | Admitting: Family Medicine

## 2017-09-02 VITALS — BP 124/73 | HR 62 | Temp 97.5°F | Ht 75.0 in | Wt 213.0 lb

## 2017-09-02 DIAGNOSIS — M79671 Pain in right foot: Secondary | ICD-10-CM

## 2017-09-02 DIAGNOSIS — M722 Plantar fascial fibromatosis: Secondary | ICD-10-CM

## 2017-09-02 DIAGNOSIS — E034 Atrophy of thyroid (acquired): Secondary | ICD-10-CM

## 2017-09-02 DIAGNOSIS — Z0000 Encounter for general adult medical examination without abnormal findings: Secondary | ICD-10-CM | POA: Diagnosis not present

## 2017-09-02 DIAGNOSIS — E782 Mixed hyperlipidemia: Secondary | ICD-10-CM | POA: Diagnosis not present

## 2017-09-02 DIAGNOSIS — E559 Vitamin D deficiency, unspecified: Secondary | ICD-10-CM | POA: Diagnosis not present

## 2017-09-02 DIAGNOSIS — R5383 Other fatigue: Secondary | ICD-10-CM | POA: Diagnosis not present

## 2017-09-02 DIAGNOSIS — R067 Sneezing: Secondary | ICD-10-CM

## 2017-09-02 DIAGNOSIS — I700 Atherosclerosis of aorta: Secondary | ICD-10-CM | POA: Diagnosis not present

## 2017-09-02 DIAGNOSIS — K219 Gastro-esophageal reflux disease without esophagitis: Secondary | ICD-10-CM | POA: Diagnosis not present

## 2017-09-02 DIAGNOSIS — R1310 Dysphagia, unspecified: Secondary | ICD-10-CM

## 2017-09-02 DIAGNOSIS — I2510 Atherosclerotic heart disease of native coronary artery without angina pectoris: Secondary | ICD-10-CM

## 2017-09-02 LAB — MICROSCOPIC EXAMINATION
Bacteria, UA: NONE SEEN
RENAL EPITHEL UA: NONE SEEN /HPF
WBC, UA: NONE SEEN /hpf (ref 0–5)

## 2017-09-02 LAB — URINALYSIS, COMPLETE
Bilirubin, UA: NEGATIVE
Glucose, UA: NEGATIVE
Ketones, UA: NEGATIVE
LEUKOCYTES UA: NEGATIVE
Nitrite, UA: NEGATIVE
PH UA: 7 (ref 5.0–7.5)
PROTEIN UA: NEGATIVE
SPEC GRAV UA: 1.01 (ref 1.005–1.030)
Urobilinogen, Ur: 0.2 mg/dL (ref 0.2–1.0)

## 2017-09-02 MED ORDER — OMEPRAZOLE 20 MG PO CPDR: DELAYED_RELEASE_CAPSULE | ORAL | 3 refills | Status: DC

## 2017-09-02 MED ORDER — FLUTICASONE PROPIONATE 50 MCG/ACT NA SUSP: 2.0000 | Freq: Every day | NASAL | 11 refills | Status: DC

## 2017-09-02 MED ORDER — RANOLAZINE ER 500 MG PO TB12: 500.0000 mg | ORAL_TABLET | Freq: Two times a day (BID) | ORAL | 3 refills | Status: DC

## 2017-09-02 MED ORDER — VITAMIN D (ERGOCALCIFEROL) 1.25 MG (50000 UNIT) PO CAPS: ORAL_CAPSULE | ORAL | 3 refills | Status: DC

## 2017-09-02 MED ORDER — LEVOTHYROXINE SODIUM 100 MCG PO TABS: ORAL_TABLET | ORAL | 3 refills | Status: DC

## 2017-09-02 MED ORDER — ROSUVASTATIN CALCIUM 20 MG PO TABS: ORAL_TABLET | ORAL | 3 refills | Status: DC

## 2017-09-02 NOTE — Progress Notes (Signed)
Subjective:    Patient ID: [name]., male    DOB: [birth year], 67 y.o.   MRN: [id number]  HPI Pt here for follow up and management of chronic medical problems which includes hyperlipidemia. He is taking medication regularly.  The patient today comes in for his yearly physical exam.  He is complaining with some right foot pain.  He is requesting refills on all of his medicines.  Patient has a history of abdominal aortic atherosclerosis coronary artery disease GERD diverticulosis hyperlipidemia and hypothyroidism.  Based on the computer record it appears that his last visit with a cardiologist was in February 2018.  He is also due for a repeat colonoscopy.  The patient is doing well overall but does have a few complaints.  He denies any chest pain or shortness of breath anymore than usual.  There is a strong family history of heart disease and he has not seen the cardiologist since February 2018 so we will try to get him an earlier appointment for follow-up.  He also complains of some trouble with swallowing and he had a severe episode of this recently feeling bad for a couple days afterwards.  He is now doing better with that.  He has had the swallowing issues for years but this bad episode was more recent.  He has not seen any blood in the stool or had any black tarry bowel movements.  He is past due to having a colonoscopy which should have been done earlier this year.  He is passing his water without problems.     Patient Active Problem List   Diagnosis Date Noted  . Abdominal aortic atherosclerosis ([place]) 08/05/2016  . Diverticulosis of colon (without mention of hemorrhage)   . Hyperlipidemia LDL goal <70   . Benign neoplasm of colon   . COLONIC POLYPS 06/12/2007  . Hypothyroidism 06/12/2007  . Coronary artery disease 06/12/2007  . CAD 06/12/2007  . GERD 06/12/2007  . Diverticulosis of colon 06/12/2007  . GUAIAC POSITIVE STOOL 06/12/2007   Outpatient Encounter Medications as of  09/02/2017  Medication Sig  . aspirin 81 MG EC tablet Take 81 mg by mouth daily.    . ciprofloxacin-dexamethasone (CIPRODEX) OTIC suspension Place 4 drops into both ears 2 (two) times daily.  . fluticasone (FLONASE) 50 MCG/ACT nasal spray USE 2 SPRAYS IN EACH NOSTRIL DAILY  . levothyroxine (SYNTHROID, LEVOTHROID) 100 MCG tablet TAKE ONE (1) TABLET EACH DAY  . Naproxen (NAPROXEN) 375 MG TBEC Take 1 tablet (375 mg total) by mouth 2 (two) times daily as needed.  Marland Kitchen omeprazole (PRILOSEC) 20 MG capsule TAKE TWO CAPSULES EACH DAY  . RANEXA 500 MG 12 hr tablet TAKE ONE TABLET BY MOUTH TWICE DAILY  . ROBAFEN AC 100-10 MG/5ML syrup TAKE 1 TEASPOONFUL EVERY 6 HOURS AS NEEDED FOR COUGH  . rosuvastatin (CRESTOR) 20 MG tablet TAKE ONE (1) TABLET EACH DAY  . Vitamin D, Ergocalciferol, (DRISDOL) 50000 units CAPS capsule TAKE 1 CAPSULE EVERY WEEK  . nitroGLYCERIN (NITROSTAT) 0.4 MG SL tablet Place 0.4 mg under the tongue every 5 (five) minutes as needed. Reported on 06/05/2015   No facility-administered encounter medications on file as of 09/02/2017.      Review of Systems  Constitutional: Negative.   HENT: Negative.   Eyes: Negative.   Respiratory: Negative.   Cardiovascular: Negative.   Gastrointestinal: Negative.   Endocrine: Negative.   Genitourinary: Negative.   Musculoskeletal: Positive for arthralgias (right foot pain).  Skin: Negative.   Allergic/Immunologic:  Negative.   Neurological: Negative.   Hematological: Negative.   Psychiatric/Behavioral: Negative.        Objective:   Physical Exam  Constitutional: He is oriented to person, place, and time. He appears well-developed and well-nourished. No distress.  Patient is pleasant and alert  HENT:  Head: Normocephalic and atraumatic.  Right Ear: External ear normal.  Left Ear: External ear normal.  Nose: Nose normal.  Mouth/Throat: Oropharynx is clear and moist. No oropharyngeal exudate.  Eyes: Pupils are equal, round, and reactive to  light. Conjunctivae and EOM are normal. Right eye exhibits no discharge. Left eye exhibits no discharge. No scleral icterus.  Neck: Normal range of motion. Neck supple. No thyromegaly present.  No bruits thyromegaly or anterior cervical adenopathy  Cardiovascular: Normal rate, regular rhythm, normal heart sounds and intact distal pulses.  No murmur heard. Heart has a regular rate and rhythm at 60/min with good pedal pulses.  Pulmonary/Chest: Effort normal and breath sounds normal. He has no wheezes. He has no rales. He exhibits no tenderness.  Clear anteriorly and posteriorly and no axillary adenopathy  Abdominal: Soft. Bowel sounds are normal. He exhibits no mass. There is no tenderness.  Slight epigastric and general abdominal tenderness without masses organ enlargement bruits or inguinal adenopathy  Genitourinary: Rectum normal and penis normal.  Genitourinary Comments: The prostate is enlarged on the right side without lumps or masses.  There were no rectal masses.  The external genitalia were within normal limits and no inguinal hernias were palpable.  Musculoskeletal: Normal range of motion. He exhibits tenderness. He exhibits no edema or deformity.  There is plantar tenderness just distal to the calcaneus on the right foot.  Lymphadenopathy:    He has no cervical adenopathy.  Neurological: He is alert and oriented to person, place, and time. He has normal reflexes. No cranial nerve deficit.  Skin: Skin is warm and dry. No rash noted.  Psychiatric: He has a normal mood and affect. His behavior is normal. Judgment and thought content normal.  Behavior affect and thought processes are normal.  Nursing note and vitals reviewed.  BP 124/73 (BP Location: Left Arm)   Pulse 62   Temp (!) 97.5 F (36.4 C) (Oral)   Ht 6' 3"  (1.905 m)   Wt 213 lb (96.6 kg)   BMI 26.62 kg/m    X-ray of right foot and heel with results pending===     Assessment & Plan:  1. Mixed  hyperlipidemia -Continue Crestor pending results of lab work - [employer]; Future - CBC with Differential/Platelet; Future - Lipid panel; Future - Hepatic function panel; Future - Ambulatory referral to Cardiology  2. Vitamin D deficiency -Continue vitamin D replacement pending results of lab work - CBC with Differential/Platelet; Future - VITAMIN D 25 Hydroxy (Vit-D Deficiency, Fractures); Future  3. Hypothyroidism due to acquired atrophy of thyroid -Continue with thyroid replacement pending results of lab work - CBC with Differential/Platelet; Future - Thyroid Panel With TSH; Future  4. Gastroesophageal reflux disease, esophagitis presence not specified -Continue with current treatment and consider getting endoscopy because of increased trouble swallowing and discussed these symptoms with the gastroenterologist when you are scheduled for a visit to see him because of arranging a colonoscopy - CBC with Differential/Platelet; Future - Ambulatory referral to Gastroenterology  5. Health care maintenance - BMP8+EGFR; Future - CBC with Differential/Platelet; Future - PSA, total and free; Future - Urinalysis, Complete - Ambulatory referral to Cardiology - Ambulatory referral to Gastroenterology  6. Sneezing -  fluticasone (FLONASE) 50 MCG/ACT nasal spray; Place 2 sprays into both nostrils daily.  Dispense: 16 g; Refill: 11 - CBC with Differential/Platelet; Future  7. Abdominal aortic atherosclerosis ([facility]) -Continue Crestor and aggressive therapeutic lifestyle changes - Ambulatory referral to Cardiology  8. Dysphagia, unspecified type - Ambulatory referral to Gastroenterology  9. Right foot pain - DG Foot Complete Right; Future  10. Other fatigue -This is mostly daytime fatigue and the patient complains of persistent snoring. -We will give the patient my number so he can call and reschedule his sleep apnea evaluation  11. Coronary artery disease involving native coronary  artery of native heart without angina pectoris -Follow-up with cardiology as planned  12. Plantar fasciitis -Probable heel spur but get x-rays of right plantar foot and calcaneus today.  13. Aortic atherosclerosis ([place]) -Continue aggressive therapeutic lifestyle changes  Meds ordered this encounter  Medications  . fluticasone (FLONASE) 50 MCG/ACT nasal spray    Sig: Place 2 sprays into both nostrils daily.    Dispense:  16 g    Refill:  11  . Vitamin D, Ergocalciferol, (DRISDOL) 50000 units CAPS capsule    Sig: TAKE 1 CAPSULE EVERY WEEK    Dispense:  12 capsule    Refill:  3  . rosuvastatin (CRESTOR) 20 MG tablet    Sig: TAKE ONE (1) TABLET EACH DAY    Dispense:  90 tablet    Refill:  3  . ranolazine (RANEXA) 500 MG 12 hr tablet    Sig: Take 1 tablet (500 mg total) by mouth 2 (two) times daily.    Dispense:  180 tablet    Refill:  3  . omeprazole (PRILOSEC) 20 MG capsule    Sig: Take 2 caps po daily    Dispense:  180 capsule    Refill:  3  . levothyroxine (SYNTHROID, LEVOTHROID) 100 MCG tablet    Sig: TAKE ONE (1) TABLET EACH DAY    Dispense:  90 tablet    Refill:  3   Patient Instructions                       Medicare Annual Wellness Visit  [facility] and the medical providers at [facility] strive to bring you the best medical care.  In doing so we not only want to address your current medical conditions and concerns but also to detect new conditions early and prevent illness, disease and health-related problems.    Medicare offers a yearly Wellness Visit which allows our clinical staff to assess your need for preventative services including immunizations, lifestyle education, counseling to decrease risk of preventable diseases and screening for fall risk and other medical concerns.    This visit is provided free of charge (no copay) for all Medicare recipients. The clinical pharmacists at [facility] have begun to conduct  these Wellness Visits which will also include a thorough review of all your medications.    As you primary medical provider recommend that you make an appointment for your Annual Wellness Visit if you have not done so already this year.  You may set up this appointment before you leave today or you may call back [phone]) and schedule an appointment.  Please make sure when you call that you mention that you are scheduling your Annual Wellness Visit with the clinical pharmacist so that the appointment may be made for the proper length of time.     Continue current medications. Continue good  therapeutic lifestyle changes which include good diet and exercise. Fall precautions discussed with patient. If an FOBT was given today- please return it to our front desk. If you are over 58 years old - you may need Prevnar 59 or the adult Pneumonia vaccine.  **Flu shots are available--- please call and schedule a FLU-CLINIC appointment**  After your visit with [place] today you will receive a survey in the mail or online from [employer] regarding your care with [place]. Please take a moment to fill this out. Your feedback is very important to [place] as you can help [place] better understand your patient needs as well as improve your experience and satisfaction. WE CARE ABOUT YOU!!!   We will make arrangements for him to have his follow-up visit with the cardiologist, Dr. Percival Spanish We will also arrange for him to have a sleep study as the previous one was postponed and never got rescheduled We will also arrange for him to have his repeat colonoscopy and the patient will remind the gastroenterologist that he is having more trouble with swallowing with one severe episode recently. He should continue to drink plenty of fluids and stay well-hydrated.   Arrie Senate MD

## 2017-09-02 NOTE — Patient Instructions (Addendum)
Medicare Annual Wellness Visit  [facility] and the medical providers at [facility] strive to bring you the best medical care.  In doing so we not only want to address your current medical conditions and concerns but also to detect new conditions early and prevent illness, disease and health-related problems.    Medicare offers a yearly Wellness Visit which allows our clinical staff to assess your need for preventative services including immunizations, lifestyle education, counseling to decrease risk of preventable diseases and screening for fall risk and other medical concerns.    This visit is provided free of charge (no copay) for all Medicare recipients. The clinical pharmacists at [facility] have begun to conduct these Wellness Visits which will also include a thorough review of all your medications.    As you primary medical provider recommend that you make an appointment for your Annual Wellness Visit if you have not done so already this year.  You may set up this appointment before you leave today or you may call back [phone]) and schedule an appointment.  Please make sure when you call that you mention that you are scheduling your Annual Wellness Visit with the clinical pharmacist so that the appointment may be made for the proper length of time.     Continue current medications. Continue good therapeutic lifestyle changes which include good diet and exercise. Fall precautions discussed with patient. If an FOBT was given today- please return it to our front desk. If you are over 11 years old - you may need Prevnar 1 or the adult Pneumonia vaccine.  **Flu shots are available--- please call and schedule a FLU-CLINIC appointment**  After your visit with [place] today you will receive a survey in the mail or online from [employer] regarding your care with [place]. Please take a moment to fill this out. Your feedback is very  important to [place] as you can help [place] better understand your patient needs as well as improve your experience and satisfaction. WE CARE ABOUT YOU!!!   We will make arrangements for him to have his follow-up visit with the cardiologist, Dr. Percival Spanish We will also arrange for him to have a sleep study as the previous one was postponed and never got rescheduled We will also arrange for him to have his repeat colonoscopy and the patient will remind the gastroenterologist that he is having more trouble with swallowing with one severe episode recently. He should continue to drink plenty of fluids and stay well-hydrated.

## 2017-09-07 ENCOUNTER — Other Ambulatory Visit: Payer: Medicare HMO

## 2017-09-07 DIAGNOSIS — R067 Sneezing: Secondary | ICD-10-CM

## 2017-09-07 DIAGNOSIS — Z125 Encounter for screening for malignant neoplasm of prostate: Secondary | ICD-10-CM | POA: Diagnosis not present

## 2017-09-07 DIAGNOSIS — E559 Vitamin D deficiency, unspecified: Secondary | ICD-10-CM | POA: Diagnosis not present

## 2017-09-07 DIAGNOSIS — E782 Mixed hyperlipidemia: Secondary | ICD-10-CM | POA: Diagnosis not present

## 2017-09-07 DIAGNOSIS — K219 Gastro-esophageal reflux disease without esophagitis: Secondary | ICD-10-CM | POA: Diagnosis not present

## 2017-09-07 DIAGNOSIS — E034 Atrophy of thyroid (acquired): Secondary | ICD-10-CM

## 2017-09-07 DIAGNOSIS — Z0000 Encounter for general adult medical examination without abnormal findings: Secondary | ICD-10-CM

## 2017-09-08 ENCOUNTER — Other Ambulatory Visit: Payer: Self-pay | Admitting: *Deleted

## 2017-09-08 LAB — CBC WITH DIFFERENTIAL/PLATELET
BASOS ABS: 0 10*3/uL (ref 0.0–0.2)
Basos: 0 %
EOS (ABSOLUTE): 0.2 10*3/uL (ref 0.0–0.4)
EOS: 3 %
HEMATOCRIT: 41.6 % (ref 37.5–51.0)
Hemoglobin: 13.9 g/dL (ref 13.0–17.7)
IMMATURE GRANULOCYTES: 0 %
Immature Grans (Abs): 0 10*3/uL (ref 0.0–0.1)
LYMPHS ABS: 2.1 10*3/uL (ref 0.7–3.1)
Lymphs: 37 %
MCH: 29.8 pg (ref 26.6–33.0)
MCHC: 33.4 g/dL (ref 31.5–35.7)
MCV: 89 fL (ref 79–97)
MONOS ABS: 0.4 10*3/uL (ref 0.1–0.9)
Monocytes: 8 %
NEUTROS PCT: 52 %
Neutrophils Absolute: 2.8 10*3/uL (ref 1.4–7.0)
PLATELETS: 252 10*3/uL (ref 150–450)
RBC: 4.67 x10E6/uL (ref 4.14–5.80)
RDW: 15.1 % (ref 12.3–15.4)
WBC: 5.5 10*3/uL (ref 3.4–10.8)

## 2017-09-08 LAB — BMP8+EGFR
BUN/Creatinine Ratio: 9 — ABNORMAL LOW (ref 10–24)
BUN: 10 mg/dL (ref 8–27)
CALCIUM: 9.5 mg/dL (ref 8.6–10.2)
CHLORIDE: 98 mmol/L (ref 96–106)
CO2: 21 mmol/L (ref 20–29)
Creatinine, Ser: 1.09 mg/dL (ref 0.76–1.27)
GFR calc Af Amer: 80 mL/min/{1.73_m2} (ref >59)
GFR, EST NON AFRICAN AMERICAN: 69 mL/min/{1.73_m2} (ref >59)
GLUCOSE: 107 mg/dL — AB (ref 65–99)
POTASSIUM: 4.5 mmol/L (ref 3.5–5.2)
SODIUM: 137 mmol/L (ref 134–144)

## 2017-09-08 LAB — HEPATIC FUNCTION PANEL
ALK PHOS: 80 IU/L (ref 39–117)
ALT: 30 IU/L (ref 0–44)
AST: 23 IU/L (ref 0–40)
Albumin: 4.5 g/dL (ref 3.6–4.8)
BILIRUBIN TOTAL: 0.4 mg/dL (ref 0.0–1.2)
BILIRUBIN, DIRECT: 0.1 mg/dL (ref 0.00–0.40)
Total Protein: 7.2 g/dL (ref 6.0–8.5)

## 2017-09-08 LAB — PSA, TOTAL AND FREE
PSA, Free Pct: 15.2 %
PSA, Free: 0.44 ng/mL
Prostate Specific Ag, Serum: 2.9 ng/mL (ref 0.0–4.0)

## 2017-09-08 LAB — THYROID PANEL WITH TSH
Free Thyroxine Index: 2.2 (ref 1.2–4.9)
T3 Uptake Ratio: 28 % (ref 24–39)
T4, Total: 8 ug/dL (ref 4.5–12.0)
TSH: 5.58 u[IU]/mL — ABNORMAL HIGH (ref 0.450–4.500)

## 2017-09-08 LAB — LIPID PANEL
CHOL/HDL RATIO: 7.2 ratio — AB (ref 0.0–5.0)
Cholesterol, Total: 238 mg/dL — ABNORMAL HIGH (ref 100–199)
HDL: 33 mg/dL — AB (ref >39)
Triglycerides: 809 mg/dL (ref 0–149)

## 2017-09-08 LAB — VITAMIN D 25 HYDROXY (VIT D DEFICIENCY, FRACTURES): VIT D 25 HYDROXY: 26.5 ng/mL — AB (ref 30.0–100.0)

## 2017-09-08 MED ORDER — LEVOTHYROXINE SODIUM 25 MCG PO TABS: ORAL_TABLET | ORAL | 3 refills | Status: DC

## 2017-09-08 MED ORDER — ICOSAPENT ETHYL 1 G PO CAPS: 2.0000 | ORAL_CAPSULE | Freq: Two times a day (BID) | ORAL | 3 refills | Status: DC

## 2017-09-08 NOTE — Progress Notes (Signed)
levothy

## 2017-09-18 ENCOUNTER — Ambulatory Visit (INDEPENDENT_AMBULATORY_CARE_PROVIDER_SITE_OTHER): Payer: Medicare HMO | Admitting: Pulmonary Disease

## 2017-09-18 ENCOUNTER — Encounter: Payer: Self-pay | Admitting: Pulmonary Disease

## 2017-09-18 VITALS — BP 118/72 | HR 59 | Ht 75.0 in | Wt 212.0 lb

## 2017-09-18 DIAGNOSIS — G4733 Obstructive sleep apnea (adult) (pediatric): Secondary | ICD-10-CM | POA: Diagnosis not present

## 2017-09-18 NOTE — Patient Instructions (Signed)
Moderate likelihood of significant sleep disordered breathing  Daytime sleepiness   We will set up home sleep study  Treatment options will include CPAP therapy or other modalities  No treatment indicated if the sleep apnea is very mild  Will see you in the office 6 to 8 weeks after study

## 2017-09-18 NOTE — Progress Notes (Signed)
[name]    [id number]    1950/04/24  Primary Care Physician:Moore, Estella Husk, MD  Referring Physician: Chipper Herb, [address] [place], [place] [address]  Chief complaint:  Excessive daytime sleepiness, snoring, witnessed apneas HPI:  He complains of daytime fatigue, will occasionally takes naps during the day  Goes to bed about midnight, wakes up about 7-8 AM Wakes up 3-4 times a night Wakes up about 7-8 AM Gasping respirations at night, dry mouth in the morning No family history of OSA  Sleep is described as nonrestorative Has no memory issues 20 pound weight gain  Occupation: Haematologist Exposures: No significant exposures Smoking history: non smoker Travel history: No significant travel to places him at risk  Outpatient Encounter Medications as of 09/18/2017  Medication Sig  . aspirin 81 MG EC tablet Take 81 mg by mouth daily.    . fluticasone (FLONASE) 50 MCG/ACT nasal spray Place 2 sprays into both nostrils daily.  Vanessa Kick Ethyl (VASCEPA) 1 g CAPS Take 2 capsules (2 g total) by mouth 2 (two) times daily.  Marland Kitchen levothyroxine (SYNTHROID, LEVOTHROID) 100 MCG tablet TAKE ONE (1) TABLET EACH DAY  . levothyroxine (SYNTHROID, LEVOTHROID) 25 MCG tablet Take 0.5 tab on Mon, Wed and Fri mornings before breakfast.  . Naproxen (NAPROXEN) 375 MG TBEC Take 1 tablet (375 mg total) by mouth 2 (two) times daily as needed.  . nitroGLYCERIN (NITROSTAT) 0.4 MG SL tablet Place 0.4 mg under the tongue every 5 (five) minutes as needed. Reported on 06/05/2015  . omeprazole (PRILOSEC) 20 MG capsule Take 2 caps po daily  . ranolazine (RANEXA) 500 MG 12 hr tablet Take 1 tablet (500 mg total) by mouth 2 (two) times daily.  Marland Kitchen ROBAFEN AC 100-10 MG/5ML syrup TAKE 1 TEASPOONFUL EVERY 6 HOURS AS NEEDED FOR COUGH  . rosuvastatin (CRESTOR) 20 MG tablet TAKE ONE (1) TABLET EACH DAY  . Vitamin D, Ergocalciferol, (DRISDOL) 50000 units CAPS capsule TAKE 1 CAPSULE  EVERY WEEK  . [DISCONTINUED] ciprofloxacin-dexamethasone (CIPRODEX) OTIC suspension Place 4 drops into both ears 2 (two) times daily.   No facility-administered encounter medications on file as of 09/18/2017.     Allergies as of 09/18/2017 - Review Complete 09/18/2017  Allergen Reaction Noted  . Lipitor [atorvastatin] Other (See Comments) 07/21/2012    Past Medical History:  Diagnosis Date  . Benign neoplasm of colon   . CAD (coronary artery disease)     [place] RCA, 50% Diat 2006, 2008  . Chronic kidney disease   . Diverticulosis of colon (without mention of hemorrhage)   . Esophageal reflux   . Other and unspecified hyperlipidemia   . Unspecified disorder of thyroid     Past Surgical History:  Procedure Laterality Date  . TONSILLECTOMY  1955    Family History  Problem Relation Age of Onset  . Colon cancer Neg Hx     Social History   Socioeconomic History  . Marital status: Widowed    Spouse name: Not on file  . Number of children: Not on file  . Years of education: Not on file  . Highest education level: Not on file  Occupational History  . Not on file  Social Needs  . Financial resource strain: Not on file  . Food insecurity:    Worry: Not on file    Inability: Not on file  . Transportation needs:    Medical: Not on file  Non-medical: Not on file  Tobacco Use  . Smoking status: Former Smoker    Packs/day: 0.50    Types: Cigarettes    Last attempt to quit: 07/22/1986    Years since quitting: 31.1  . Smokeless tobacco: Never Used  Substance and Sexual Activity  . Alcohol use: Yes    Alcohol/week: 1.0 standard drinks    Types: 1 Cans of beer per week  . Drug use: No  . Sexual activity: Not on file  Lifestyle  . Physical activity:    Days per week: Not on file    Minutes per session: Not on file  . Stress: Not on file  Relationships  . Social connections:    Talks on phone: Not on file    Gets together: Not on file    Attends religious service:  Not on file    Active member of club or organization: Not on file    Attends meetings of clubs or organizations: Not on file    Relationship status: Not on file  . Intimate partner violence:    Fear of current or ex partner: Not on file    Emotionally abused: Not on file    Physically abused: Not on file    Forced sexual activity: Not on file  Other Topics Concern  . Not on file  Social History Narrative  . Not on file    Review of systems: Review of Systems  Constitutional: Negative for fever and chills.  HENT: Negative.   Eyes: Negative for blurred vision.  Respiratory: as per HPI  Cardiovascular: Negative for chest pain and palpitations.  Gastrointestinal: Negative for vomiting, diarrhea, blood per rectum. Genitourinary: Negative for dysuria, urgency, frequency and hematuria.  Musculoskeletal: Negative for myalgias, back pain and joint pain.  Skin: Negative for itching and rash.  Neurological: Negative for dizziness, tremors, focal weakness, seizures and loss of consciousness.  Endo/Heme/Allergies: Negative for environmental allergies.  Psychiatric/Behavioral: Negative for depression, suicidal ideas and hallucinations.  All other systems reviewed and are negative.  Physical Exam:  Vitals:   09/18/17 1420  BP: 118/72  Pulse: (!) 59  SpO2: 96%   Gen:      No acute distress HEENT:  EOMI, sclera anicteric , crowded oropharynx, Mallampati 3  neck:     No masses; no thyromegaly Lungs:    Clear to auscultation bilaterally; normal respiratory effort CV:         Regular rate and rhythm; no murmurs Abd:      + bowel sounds; soft, non-tender; no palpable masses, no distension Ext:    No edema; adequate peripheral perfusion Skin:      Warm and dry; no rash Neuro: alert and oriented x 3 Psych: normal mood and affect  Data Reviewed: Marland Kitchen  Records reviewed  Assessment:  .  Moderate likelihood of significant sleep disordered breathing  .  Daytime sleepiness  .  History of  coronary artery disease  .  History of hypothyroidism  .  Plan/Recommendations:  .  Schedule for home sleep study  .  Possible treatment of obstructive sleep apnea with auto titrating CPAP  .  Pathophysiology of sleep disordered breathing discussed  .  Options of treatment of obstructive sleep apnea discussed    Sherrilyn Rist MD [facility] 09/18/2017, 2:26 PM  CC: Chipper Herb, MD

## 2017-09-23 IMAGING — CR DG FOOT COMPLETE 3+V*L*
3 series · 3 of 3 positions shown · non-contrast
Comparison: None in PACs

CLINICAL DATA: Heel pain without known injury

EXAM:
LEFT FOOT - COMPLETE 3+ VIEW

[view not recorded (1 of 3)]
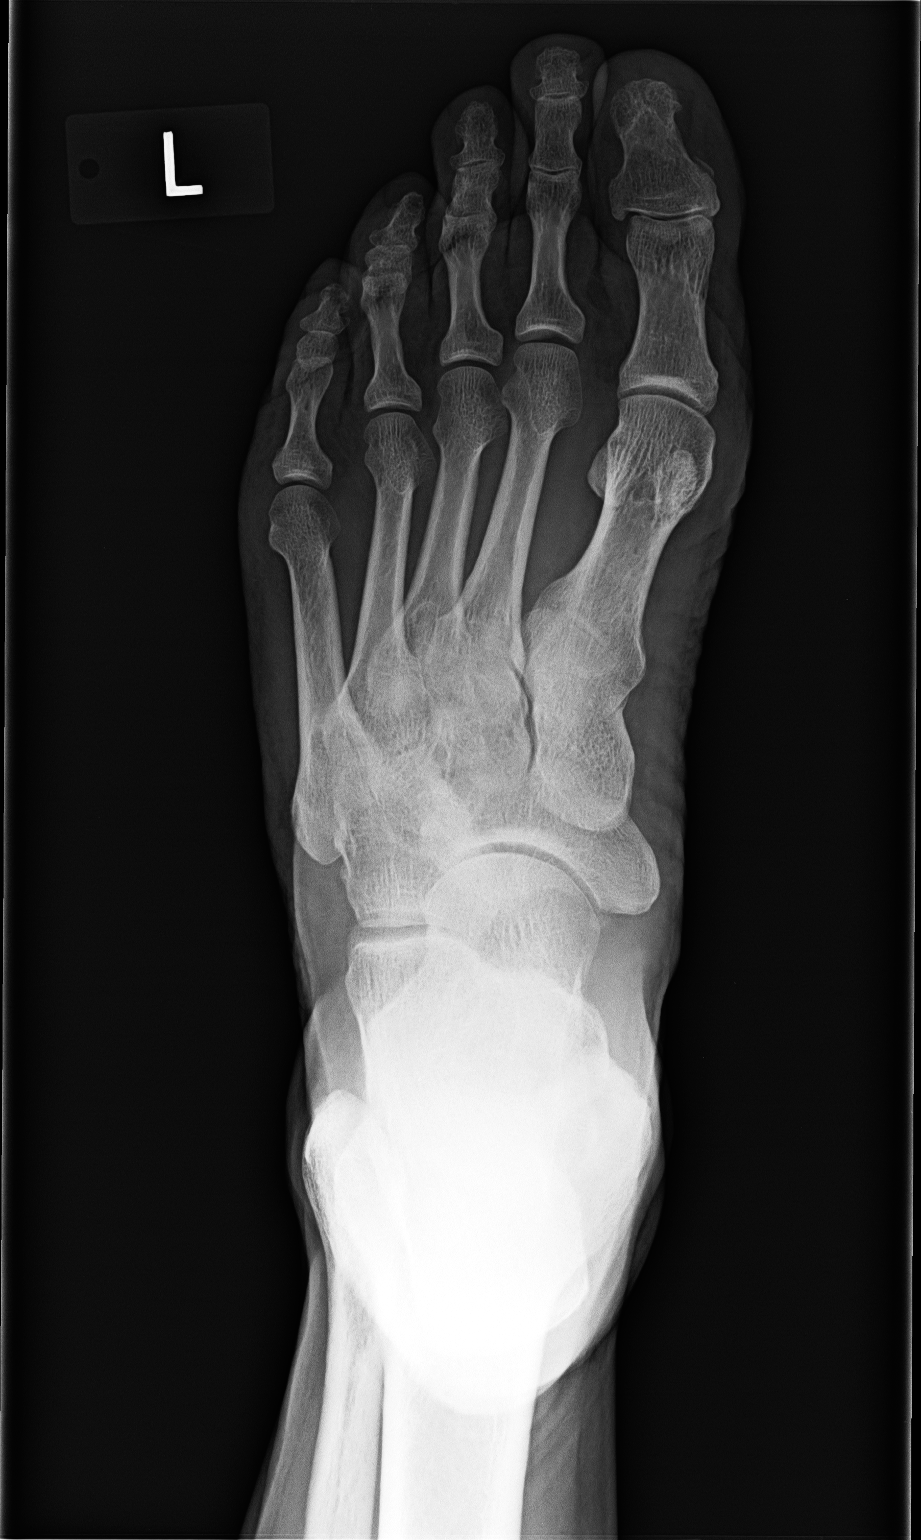

[view not recorded (2 of 3)]
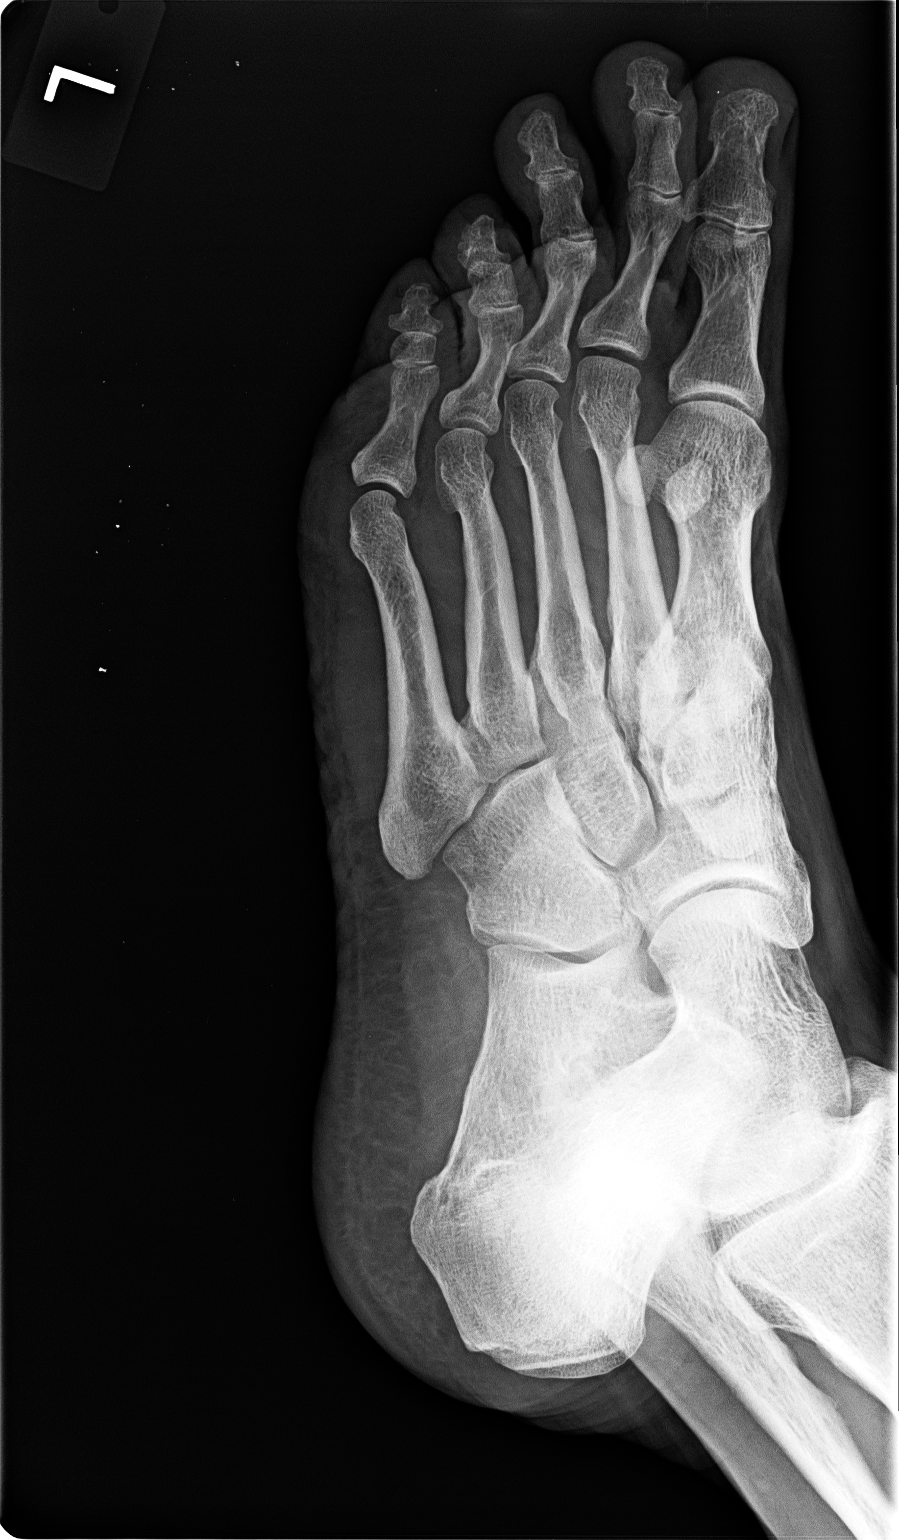

[view not recorded (3 of 3)]
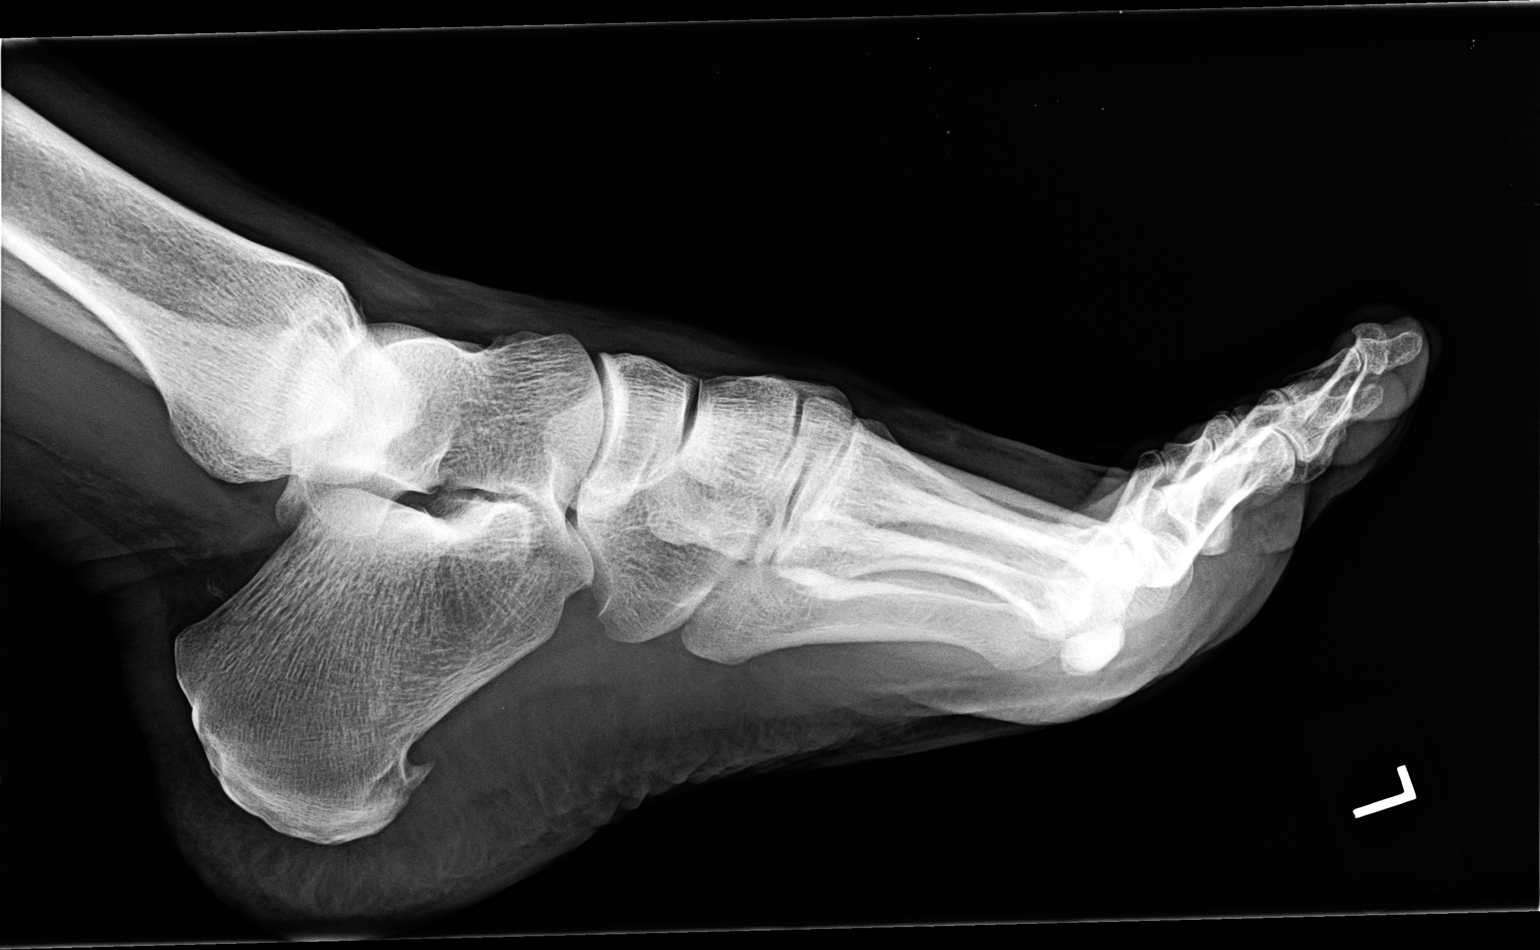

[3 of 3 positions shown; findings below may reference images not displayed]

FINDINGS: The bones of the left foot are adequately mineralized for age. The
interphalangeal, metatarsophalangeal, and tarsometatarsal joints
appear normal. The intertarsal joints exhibit no acute abnormalities
either. There is a moderate-sized plantar calcaneal spur. The
calcaneus is otherwise normal. The soft tissues of the plantar
aspect of the foot exhibit no acute abnormalities.
IMPRESSION: There is a moderate-sized calcaneal spur. There is no acute bony
abnormality.

## 2017-09-28 ENCOUNTER — Ambulatory Visit (INDEPENDENT_AMBULATORY_CARE_PROVIDER_SITE_OTHER): Payer: Medicare HMO | Admitting: *Deleted

## 2017-09-28 VITALS — BP 128/69 | HR 68 | Temp 97.9°F | Ht 75.0 in | Wt 212.0 lb

## 2017-09-28 DIAGNOSIS — Z0000 Encounter for general adult medical examination without abnormal findings: Secondary | ICD-10-CM

## 2017-09-28 NOTE — Progress Notes (Addendum)
Subjective:   [name]. is a 67 y.o. male who presents for an Initial Medicare Annual Wellness Visit. 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 He plays golf and does yard work for exercise and states that his diet is semi-healthy. He typically eats 3 meals a day. He is active at church and lives at home alone, as he is widowed. He has 3 children and 4 grandchildren. He does not have any pets and fall risks were discussed today. He states that his health is about the same as a year ago.    Cardiac Risk Factors include: hypertension;male gender;dyslipidemia;advanced age (>30men, >75 women)    Objective:    Today's Vitals   09/28/17 1113  BP: 128/69  Pulse: 68  Temp: 97.9 F (36.6 C)  TempSrc: Oral  Weight: 212 lb (96.2 kg)  Height: 6\' 3"  (1.905 m)   Body mass index is 26.5 kg/m.  Advanced Directives 09/28/2017  Does Patient Have a Medical Advance Directive? No  Would patient like information on creating a medical advance directive? Yes (MAU/Ambulatory/Procedural Areas - Information given)    Current Medications (verified) Outpatient Encounter Medications as of 09/28/2017  Medication Sig  . aspirin 81 MG EC tablet Take 81 mg by mouth daily.    . fluticasone (FLONASE) 50 MCG/ACT nasal spray Place 2 sprays into both nostrils daily.  Marland Kitchen levothyroxine (SYNTHROID, LEVOTHROID) 100 MCG tablet TAKE ONE (1) TABLET EACH DAY  . levothyroxine (SYNTHROID, LEVOTHROID) 25 MCG tablet Take 0.5 tab on Mon, Wed and Fri mornings before breakfast.  . Naproxen (NAPROXEN) 375 MG TBEC Take 1 tablet (375 mg total) by mouth 2 (two) times daily as needed.  . nitroGLYCERIN (NITROSTAT) 0.4 MG SL tablet Place 0.4 mg under the tongue every 5 (five) minutes as needed. Reported on 06/05/2015  . omeprazole (PRILOSEC) 20 MG capsule Take 2 caps po daily  . ranolazine (RANEXA) 500 MG 12 hr tablet Take 1 tablet (500 mg total) by mouth 2 (two)  times daily.  Marland Kitchen ROBAFEN AC 100-10 MG/5ML syrup TAKE 1 TEASPOONFUL EVERY 6 HOURS AS NEEDED FOR COUGH  . rosuvastatin (CRESTOR) 20 MG tablet TAKE ONE (1) TABLET EACH DAY  . Vitamin D, Ergocalciferol, (DRISDOL) 50000 units CAPS capsule TAKE 1 CAPSULE EVERY WEEK  . [DISCONTINUED] Icosapent Ethyl (VASCEPA) 1 g CAPS Take 2 capsules (2 g total) by mouth 2 (two) times daily.   No facility-administered encounter medications on file as of 09/28/2017.     Allergies (verified) Lipitor [atorvastatin]   History: Past Medical History:  Diagnosis Date  . Angina at rest [facility])   . Benign neoplasm of colon   . CAD (coronary artery disease)     Occluded RCA, 50% Diat 2006, 2008  . Diverticulosis of colon (without mention of hemorrhage)   . Esophageal reflux   . Other and unspecified hyperlipidemia   . Unspecified disorder of thyroid    Past Surgical History:  Procedure Laterality Date  . TONSILLECTOMY  1955   Family History  Problem Relation Age of Onset  . Hyperlipidemia Father   . Heart disease Father        pacemaker / CHF   . Dementia Maternal Grandmother   . Cancer Maternal Grandfather        throat  . Alzheimer's disease Paternal Grandmother   . Cancer Paternal Grandfather        lung  . Cancer Sister  breast  . Colon cancer Neg Hx    Social History   Socioeconomic History  . Marital status: Widowed    Spouse name: Not on file  . Number of children: 3  . Years of education: Not on file  . Highest education level: Not on file  Occupational History  . Occupation: retired     Comment: golf / club prefessional   Social Needs  . Financial resource strain: Not on file  . Food insecurity:    Worry: Not on file    Inability: Not on file  . Transportation needs:    Medical: Not on file    Non-medical: Not on file  Tobacco Use  . Smoking status: Former Smoker    Packs/day: 0.50    Types: Cigarettes    Last attempt to quit: 07/22/1986    Years since quitting: 31.2  .  Smokeless tobacco: Never Used  Substance and Sexual Activity  . Alcohol use: Yes    Alcohol/week: 1.0 standard drinks    Types: 1 Cans of beer per week  . Drug use: No  . Sexual activity: Not on file  Lifestyle  . Physical activity:    Days per week: Not on file    Minutes per session: Not on file  . Stress: Not on file  Relationships  . Social connections:    Talks on phone: Not on file    Gets together: Not on file    Attends religious service: Not on file    Active member of club or organization: Not on file    Attends meetings of clubs or organizations: Not on file    Relationship status: Not on file  Other Topics Concern  . Not on file  Social History Narrative  . Not on file   Tobacco Counseling Counseling given: Not Answered   Activities of Daily Living In your present state of health, do you have any difficulty performing the following activities: 09/28/2017  Hearing? N  Vision? Y  Comment wears readers   Difficulty concentrating or making decisions? N  Walking or climbing stairs? N  Dressing or bathing? N  Doing errands, shopping? N  Preparing Food and eating ? N  Using the Toilet? N  In the past six months, have you accidently leaked urine? N  Do you have problems with loss of bowel control? N  Managing your Medications? N  Managing your Finances? N  Housekeeping or managing your Housekeeping? N  Some recent data might be hidden     Immunizations and Health Maintenance Immunization History  Administered Date(s) Administered  . Tdap 12/08/2007   Health Maintenance Due  Topic Date Due  . COLONOSCOPY  02/15/2017    Patient Care Team: Chipper Herb, MD as PCP - General (Family Medicine) Pyrtle, Lajuan Lines, MD as Consulting Physician (Gastroenterology) Laurin Coder, MD as Consulting Physician (Pulmonary Disease) Minus Breeding, MD as Consulting Physician (Cardiology)  Indicate any recent Medical Services you may have received from other than  [facility] providers in the past year (date may be approximate).    Assessment:   This is a routine wellness examination for [facility].  Hearing/Vision screen No exam data present  Dietary issues and exercise activities discussed: Current Exercise Habits: Home exercise routine, Type of exercise: walking(golf / yard work ), Time (Minutes): 60, Frequency (Times/Week): 5, Weekly Exercise (Minutes/Week): 300, Intensity: Mild  Goals    . Increase physical activity     Exercise more  Swim more     .  Prevent falls      Depression Screen PHQ 2/9 Scores 09/28/2017 09/02/2017 07/24/2017 01/27/2017  PHQ - 2 Score 0 0 0 0    Fall Risk Fall Risk  09/28/2017 09/02/2017 07/24/2017 01/27/2017 07/28/2016  Falls in the past year? No No No No No  Number falls in past yr: - - - - -  Injury with Fall? - - - - -    Is the patient's home free of loose throw rugs in walkways, pet beds, electrical cords, etc?   Fall risks and hazards were discussed today.  Cognitive Function: MMSE - Mini Mental State Exam 09/28/2017  Orientation to time 5  Orientation to Place 5  Registration 3  Attention/ Calculation 5  Recall 3  Language- name 2 objects 2  Language- repeat 1  Language- follow 3 step command 3  Language- read & follow direction 1  Write a sentence 1  Copy design 1  Total score 30        Screening Tests Health Maintenance  Topic Date Due  . COLONOSCOPY  02/15/2017  . INFLUENZA VACCINE  09/29/2017 (Originally 09/10/2017)  . COLON CANCER SCREENING ANNUAL FOBT  09/30/2017 (Originally 06/10/2016)  . Hepatitis C Screening  01/27/2018 (Originally 04/25/1950)  . PNA vac Low Risk Adult (1 of 2 - PCV13) 07/28/2021 (Originally 01/10/2014)  . TETANUS/TDAP  12/07/2017    Qualifies for Shingles Vaccine? Declined   Cancer Screenings: Lung: Low Dose CT Chest recommended if Age 30-80 years, 30 pack-year currently smoking OR have quit w/in 15years. Patient does not qualify. Colorectal: has and will  return  Additional Screenings: declined  Hepatitis C Screening:       Plan:   pt is to keep follow up with Dr Laurance Flatten and other specialist  He will bring back FOBT and see GI soon for 5 YR colon recall. He declined pneumonia, Flu and shingles vaccines. He is scheduled for eye exam in [place] 2019.  I have personally reviewed and noted the following in the patient's chart:   . Medical and social history . Use of alcohol, tobacco or illicit drugs  . Current medications and supplements . Functional ability and status . Nutritional status . Physical activity . Advanced directives . List of other physicians . Hospitalizations, surgeries, and ER visits in previous 12 months . Vitals . Screenings to include cognitive, depression, and falls . Referrals and appointments  In addition, I have reviewed and discussed with patient certain preventive protocols, quality metrics, and best practice recommendations. A written personalized care plan for preventive services as well as general preventive health recommendations were provided to patient.     Haytham Maher, Cameron Proud, LPN   05/28/4079   I have reviewed and agree with the above AWV documentation.   Mary-Margaret Hassell Done, FNP

## 2017-09-28 NOTE — Patient Instructions (Signed)
  Mr. [name] , Thank you for taking time to come for your Medicare Wellness Visit. I appreciate your ongoing commitment to your health goals. Please review the following plan we discussed and let me know if I can assist you in the future.   These are the goals we discussed: Goals    . Increase physical activity     Exercise more  Swim more     . Prevent falls       This is a list of the screening recommended for you and due dates:  Health Maintenance  Topic Date Due  . Colon Cancer Screening  02/15/2017  . Flu Shot  09/29/2017*  . Stool Blood Test  09/30/2017*  .  Hepatitis C: One time screening is recommended by Center for Disease Control  (CDC) for  adults born from [birth year] through 1967.   01/27/2018*  . Pneumonia vaccines (1 of 2 - PCV13) 07/28/2021*  . Tetanus Vaccine  12/07/2017  *Topic was postponed. The date shown is not the original due date.    Keep follow up with Dr Laurance Flatten and other specialist Stay active and try to prevent falls

## 2017-10-04 DIAGNOSIS — G4733 Obstructive sleep apnea (adult) (pediatric): Secondary | ICD-10-CM | POA: Diagnosis not present

## 2017-10-05 ENCOUNTER — Encounter: Payer: Self-pay | Admitting: Gastroenterology

## 2017-10-05 ENCOUNTER — Telehealth: Payer: Self-pay | Admitting: Pulmonary Disease

## 2017-10-05 DIAGNOSIS — G4733 Obstructive sleep apnea (adult) (pediatric): Secondary | ICD-10-CM | POA: Diagnosis not present

## 2017-10-05 NOTE — Telephone Encounter (Signed)
Patient returned phone call; pt contact # [phone]

## 2017-10-05 NOTE — Telephone Encounter (Signed)
Per AO, HST showed that the patient has severe OSA. He recommends auto cpap 5-15cm. He advises against driving when sleepy and after taking medications with sedative side effects.   Left message for patient to call back for results.

## 2017-10-05 NOTE — Telephone Encounter (Signed)
Called patient unable to reach left message to give us a call back.

## 2017-10-06 ENCOUNTER — Other Ambulatory Visit: Payer: Self-pay | Admitting: *Deleted

## 2017-10-06 DIAGNOSIS — G4733 Obstructive sleep apnea (adult) (pediatric): Secondary | ICD-10-CM

## 2017-10-06 NOTE — Progress Notes (Signed)
Cardiology Office Note   Date:  10/07/2017   ID:  Corey Ochs., DOB [birth year], MRN [id number]  PCP:  Chipper Herb, MD  Cardiologist:   Minus Breeding, MD   Chief Complaint  Patient presents with  . Coronary Artery Disease        History of Present Illness: [name]. is a 67 y.o. male who presents for follow up of CAD.  He's had coronary disease with catheterizations in 2006 and 2008 demonstrating an occluded right coronary artery. He had nonobstructive disease in a diagonal. He was managed medically. His only symptom in 2006 was some dyspnea. However, he had abnormal stress test lead to catheterization. He never tolerated Imdur. He has been treated with Ranexa.    He was recently diagnosed with sleep apnea and is going to have follow-up for this.  I do note again that his triglycerides were high.  He was supposed to be seen in the lipid clinic but he really did not want to do this.  He did agree to try Vascepa but then could not afford this.  He said he watches a good diet.  He is not describing any chest pressure, neck or arm discomfort.  Is not describing any palpitations, presyncope or syncope.  He has no PND or orthopnea.  He does have fatigue.  He does do some vigorous activities at times such as cutting wood and he does not have symptoms related to this.   Past Medical History:  Diagnosis Date  . Angina at rest [facility])   . Benign neoplasm of colon   . CAD (coronary artery disease)     Occluded RCA, 50% Diat 2006, 2008  . Diverticulosis of colon (without mention of hemorrhage)   . Esophageal reflux   . Other and unspecified hyperlipidemia   . Unspecified disorder of thyroid     Past Surgical History:  Procedure Laterality Date  . TONSILLECTOMY  1955     Current Outpatient Medications  Medication Sig Dispense Refill  . aspirin 81 MG EC tablet Take 81 mg by mouth daily.      . fluticasone (FLONASE) 50 MCG/ACT nasal spray Place 2 sprays into both  nostrils daily. 16 g 11  . levothyroxine (SYNTHROID, LEVOTHROID) 100 MCG tablet TAKE ONE (1) TABLET EACH DAY 90 tablet 3  . levothyroxine (SYNTHROID, LEVOTHROID) 25 MCG tablet Take 0.5 tab on Mon, Wed and Fri mornings before breakfast. 36 tablet 3  . Naproxen (NAPROXEN) 375 MG TBEC Take 1 tablet (375 mg total) by mouth 2 (two) times daily as needed. 30 each 0  . nitroGLYCERIN (NITROSTAT) 0.4 MG SL tablet Place 0.4 mg under the tongue every 5 (five) minutes as needed. Reported on 06/05/2015    . omeprazole (PRILOSEC) 20 MG capsule Take 2 caps po daily 180 capsule 3  . ranolazine (RANEXA) 500 MG 12 hr tablet Take 1 tablet (500 mg total) by mouth 2 (two) times daily. 180 tablet 3  . ROBAFEN AC 100-10 MG/5ML syrup TAKE 1 TEASPOONFUL EVERY 6 HOURS AS NEEDED FOR COUGH 120 mL 0  . rosuvastatin (CRESTOR) 20 MG tablet TAKE ONE (1) TABLET EACH DAY 90 tablet 3  . Vitamin D, Ergocalciferol, (DRISDOL) 50000 units CAPS capsule TAKE 1 CAPSULE EVERY WEEK 12 capsule 3   No current facility-administered medications for this visit.     Allergies:   Imdur [isosorbide nitrate] and Lipitor [atorvastatin]    ROS:  Please see the history of present illness.  Otherwise, review of systems are positive for none.   All other systems are reviewed and negative.    PHYSICAL EXAM: VS:  BP 124/80   Pulse 60   Ht 6\' 3"  (1.905 m)   Wt 214 lb (97.1 kg)   BMI 26.75 kg/m  , BMI Body mass index is 26.75 kg/m.  GENERAL:  Well appearing NECK:  No jugular venous distention, waveform within normal limits, carotid upstroke brisk and symmetric, no bruits, no thyromegaly LUNGS:  Clear to auscultation bilaterally CHEST:  Unremarkable HEART:  PMI not displaced or sustained,S1 and S2 within normal limits, no S3, no S4, no clicks, no rubs, no murmurs ABD:  Flat, positive bowel sounds normal in frequency in pitch, no bruits, no rebound, no guarding, no midline pulsatile mass, no hepatomegaly, no splenomegaly EXT:  2 plus pulses  throughout, no edema, no cyanosis no clubbing SKIN:  [place]   EKG:  EKG is  ordered today. The ekg ordered today demonstrates sinus rhythm, rate 66, right axis deviation, nonspecific diffuse T wave flattening and subtle T wave changes.. 8/28/201960   Recent Labs: 09/07/2017: ALT 30; BUN 10; Creatinine, Ser 1.09; Hemoglobin 13.9; Platelets 252; Potassium 4.5; Sodium 137; TSH 5.580    Wt Readings from Last 3 Encounters:  10/07/17 214 lb (97.1 kg)  09/28/17 212 lb (96.2 kg)  09/18/17 212 lb (96.2 kg)    Lab Results  Component Value Date   CHOL 238 (H) 09/07/2017   TRIG 809 (HH) 09/07/2017   HDL 33 (L) 09/07/2017   [place] Comment 09/07/2017   LDLDIRECT 72 05/13/2016     Other studies Reviewed: Additional studies/ records that were reviewed today include: Labs Review of the above records demonstrates:     ASSESSMENT AND PLAN:  CAD:   The patient has no overt symptoms.  He has some nonspecific EKG changes and previous CAD.  He needs good risk reduction but I do not think further ischemic testing is indicated in the absence of symptoms and with his vigorous lifestyle.   DYSLIPIDEMIA: We have tried to encourage him to follow-up for management of his triglycerides.  He might consider taking another therapy such as ever get acid and I will send this message back to Dr. Laurance Flatten.  He seems somewhat reticent for further therapies.   SLEEP APNEA: He is to have follow-up for management of what was apparently severe sleep apnea.  I do not currently see that result.   Current medicines are reviewed at length with the patient today.  The patient does not have concerns regarding medicines.  The following changes have been made:  None  Labs/ tests ordered today include: None  Orders Placed This Encounter  Procedures  . EKG 12-Lead     Disposition:   FU with me in 12 months    Signed, Minus Breeding, MD  10/07/2017 3:06 PM    [facility]

## 2017-10-07 ENCOUNTER — Ambulatory Visit (INDEPENDENT_AMBULATORY_CARE_PROVIDER_SITE_OTHER): Payer: Medicare HMO | Admitting: Cardiology

## 2017-10-07 ENCOUNTER — Encounter: Payer: Self-pay | Admitting: Cardiology

## 2017-10-07 VITALS — BP 124/80 | HR 60 | Ht 75.0 in | Wt 214.0 lb

## 2017-10-07 DIAGNOSIS — E781 Pure hyperglyceridemia: Secondary | ICD-10-CM | POA: Diagnosis not present

## 2017-10-07 DIAGNOSIS — I2510 Atherosclerotic heart disease of native coronary artery without angina pectoris: Secondary | ICD-10-CM

## 2017-10-07 NOTE — Telephone Encounter (Signed)
Pt returned triage call.  I told him I would have nurse to call him back.

## 2017-10-07 NOTE — Patient Instructions (Signed)

## 2017-10-07 NOTE — Telephone Encounter (Signed)
Pt is returning call about results. Cb is [phone].

## 2017-10-07 NOTE — Telephone Encounter (Signed)
Called patient, made aware of results per MD Olalere. Patient requests to have a office visit before starting Cpap. Appointment made with NP [facility] September 4th at 3:00pm. Nothing further needed at this time.

## 2017-10-07 NOTE — Telephone Encounter (Signed)
LMTCB

## 2017-10-14 ENCOUNTER — Ambulatory Visit: Payer: Medicare HMO | Admitting: Primary Care

## 2017-10-14 ENCOUNTER — Encounter: Payer: Self-pay | Admitting: Primary Care

## 2017-10-14 VITALS — BP 134/90 | HR 60 | Ht 73.75 in | Wt 213.2 lb

## 2017-10-14 DIAGNOSIS — G4733 Obstructive sleep apnea (adult) (pediatric): Secondary | ICD-10-CM | POA: Diagnosis not present

## 2017-10-14 NOTE — Assessment & Plan Note (Addendum)
HST 10/04/17- AHI 22 Recommend home cpap machine, auto titrate 5-15cm H20, humidified, mask of choice  Patient had questions regarding cost of cpap machine. Discussed that we could not given an accurate estimate of cpap cost d/t a lot of different variables. Advised patient to call insurance company to find out his coverage and deductible.   We reviewed that importance of wearing cpap machine and potential complications from untreated sleep apnea. Agreed with plan and stated understanding. He would like to proceed to ordering home cpap device through a DME company and he will called [employer].   Will need follow up 31-90 days after starting CPAP

## 2017-10-14 NOTE — Progress Notes (Signed)
@Patient  ID: [name]., male    DOB: May 06, 1950, 67 y.o.   MRN: [id number]  Chief Complaint  Patient presents with  . Follow-up    discuss OSA sleep study    Referring provider: Chipper Herb, MD  HPI: 67 year old male, former smoker. PMH significant for hypothyroidism, hyperlipidemia, CAD and GERD. Patient of Dr. Ander Slade, initial consult for daytime fatigue, occasionally needing to take naps during the day. HST showed AHI 22. Recommended started home CPAP auto titrate 5-15cm H20.   10/14/2017 Patient presents today to discuss HST results. His main concern is the potential cost of cpap machine. Explained to patient that there are a lot of variables that can effect cost and that I can not give him an accurate price off hand. Advised that he called his insurance company to discuss his plan coverage and current deductible.   Allergies  Allergen Reactions  . Imdur [Isosorbide Nitrate]     Headache   . Lipitor [Atorvastatin] Other (See Comments)    myalgias    Immunization History  Administered Date(s) Administered  . Tdap 12/08/2007    Past Medical History:  Diagnosis Date  . Angina at rest [facility])   . Benign neoplasm of colon   . CAD (coronary artery disease)     Occluded RCA, 50% Diat 2006, 2008  . Diverticulosis of colon (without mention of hemorrhage)   . Esophageal reflux   . Other and unspecified hyperlipidemia   . Unspecified disorder of thyroid     Tobacco History: Social History   Tobacco Use  Smoking Status Former Smoker  . Packs/day: 0.50  . Types: Cigarettes  . Last attempt to quit: 07/22/1986  . Years since quitting: 31.2  Smokeless Tobacco Never Used   Counseling given: Not Answered   Outpatient Medications Prior to Visit  Medication Sig Dispense Refill  . aspirin 81 MG EC tablet Take 81 mg by mouth daily.      . fluticasone (FLONASE) 50 MCG/ACT nasal spray Place 2 sprays into both nostrils daily. 16 g 11  . levothyroxine (SYNTHROID,  LEVOTHROID) 100 MCG tablet TAKE ONE (1) TABLET EACH DAY 90 tablet 3  . levothyroxine (SYNTHROID, LEVOTHROID) 25 MCG tablet Take 0.5 tab on Mon, Wed and Fri mornings before breakfast. 36 tablet 3  . Naproxen (NAPROXEN) 375 MG TBEC Take 1 tablet (375 mg total) by mouth 2 (two) times daily as needed. 30 each 0  . nitroGLYCERIN (NITROSTAT) 0.4 MG SL tablet Place 0.4 mg under the tongue every 5 (five) minutes as needed. Reported on 06/05/2015    . omeprazole (PRILOSEC) 20 MG capsule Take 2 caps po daily 180 capsule 3  . ranolazine (RANEXA) 500 MG 12 hr tablet Take 1 tablet (500 mg total) by mouth 2 (two) times daily. 180 tablet 3  . ROBAFEN AC 100-10 MG/5ML syrup TAKE 1 TEASPOONFUL EVERY 6 HOURS AS NEEDED FOR COUGH 120 mL 0  . rosuvastatin (CRESTOR) 20 MG tablet TAKE ONE (1) TABLET EACH DAY 90 tablet 3  . Vitamin D, Ergocalciferol, (DRISDOL) 50000 units CAPS capsule TAKE 1 CAPSULE EVERY WEEK 12 capsule 3   No facility-administered medications prior to visit.     Review of Systems  Review of Systems  Constitutional: Positive for fatigue.  HENT: Negative.   Respiratory: Negative.     Physical Exam  BP 134/90 (BP Location: Left Arm, Cuff Size: Normal)   Pulse 60   Ht 6' 1.75" (1.873 m)   Wt 213 lb  3.2 oz (96.7 kg)   SpO2 95%   BMI 27.56 kg/m  Physical Exam  Constitutional: He is oriented to person, place, and time. He appears well-developed and well-nourished.  HENT:  Head: Normocephalic and atraumatic.  Neck: Normal range of motion.  Cardiovascular: Normal rate and regular rhythm.  Pulmonary/Chest: Effort normal. No respiratory distress.  Musculoskeletal: Normal range of motion.  Neurological: He is alert and oriented to person, place, and time.  Psychiatric: He has a normal mood and affect. His behavior is normal. Judgment and thought content normal.     Lab Results:  CBC    Component Value Date/Time   WBC 5.5 09/07/2017 0934   WBC 6.1 06/08/2014 1710   WBC 5.2 01/18/2007  1053   RBC 4.67 09/07/2017 0934   RBC 5.00 06/08/2014 1710   RBC 4.43 01/18/2007 1053   HGB 13.9 09/07/2017 0934   HCT 41.6 09/07/2017 0934   PLT 252 09/07/2017 0934   MCV 89 09/07/2017 0934   MCH 29.8 09/07/2017 0934   MCH 27.2 06/08/2014 1710   MCHC 33.4 09/07/2017 0934   MCHC 31.3 (A) 06/08/2014 1710   MCHC 34.6 01/18/2007 1053   RDW 15.1 09/07/2017 0934   LYMPHSABS 2.1 09/07/2017 0934   MONOABS 0.4 01/18/2007 1053   EOSABS 0.2 09/07/2017 0934   BASOSABS 0.0 09/07/2017 0934    BMET    Component Value Date/Time   NA 137 09/07/2017 0934   K 4.5 09/07/2017 0934   CL 98 09/07/2017 0934   CO2 21 09/07/2017 0934   GLUCOSE 107 (H) 09/07/2017 0934   GLUCOSE 101 (H) 10/15/2012 0854   BUN 10 09/07/2017 0934   CREATININE 1.09 09/07/2017 0934   CREATININE 1.40 (H) 10/15/2012 0854   CALCIUM 9.5 09/07/2017 0934   GFRNONAA 69 09/07/2017 0934   GFRNONAA 53 (L) 10/15/2012 0854   GFRAA 80 09/07/2017 0934   GFRAA 61 10/15/2012 0854    BNP No results found for: BNP  ProBNP No results found for: PROBNP  Imaging: No results found.   Assessment & Plan:   Obstructive sleep apnea HST 10/04/17- AHI 22 Recommend home cpap machine, auto titrate 5-15cm H20, humidified, mask of choice  Patient had questions regarding cost of cpap machine. Discussed that we could not given an accurate estimate of cpap cost d/t a lot of different variables. Advised patient to call insurance company to find out his coverage and deductible.   We reviewed that importance of wearing cpap machine and potential complications from untreated sleep apnea. Agreed with plan and stated understanding. He would like to proceed to ordering home cpap device through a DME company and he will called [employer].   Will need follow up 31-90 days after starting CPAP       Martyn Ehrich, NP 10/14/2017

## 2017-10-14 NOTE — Patient Instructions (Signed)
Needs DME referral for home cpap machine auto titrate 5-15cm H20, humidified with mask of choice   Please call your insurance company to discuss cost, depends on deductible and how much you have already spent out of pocket this year   FU in 31-90 days with Dr. Ander Slade after starting CPAP

## 2017-10-29 DIAGNOSIS — G4733 Obstructive sleep apnea (adult) (pediatric): Secondary | ICD-10-CM | POA: Diagnosis not present

## 2017-11-09 DIAGNOSIS — H02204 Unspecified lagophthalmos left upper eyelid: Secondary | ICD-10-CM | POA: Diagnosis not present

## 2017-11-23 ENCOUNTER — Encounter: Payer: Self-pay | Admitting: Gastroenterology

## 2017-11-23 ENCOUNTER — Ambulatory Visit (INDEPENDENT_AMBULATORY_CARE_PROVIDER_SITE_OTHER): Payer: Medicare HMO | Admitting: Gastroenterology

## 2017-11-23 VITALS — BP 140/80 | HR 70 | Ht 73.75 in | Wt 215.0 lb

## 2017-11-23 DIAGNOSIS — K219 Gastro-esophageal reflux disease without esophagitis: Secondary | ICD-10-CM

## 2017-11-23 DIAGNOSIS — Z86010 Personal history of colonic polyps: Secondary | ICD-10-CM

## 2017-11-23 DIAGNOSIS — R1310 Dysphagia, unspecified: Secondary | ICD-10-CM | POA: Diagnosis not present

## 2017-11-23 MED ORDER — SUPREP BOWEL PREP KIT 17.5-3.13-1.6 GM/177ML PO SOLN: ORAL | 0 refills | Status: DC

## 2017-11-23 NOTE — Patient Instructions (Signed)
If you are age 69 or older, your body mass index should be between 23-30. Your Body mass index is 27.79 kg/m. If this is out of the aforementioned range listed, please consider follow up with your Primary Care Provider.  If you are age 86 or younger, your body mass index should be between 19-25. Your Body mass index is 27.79 kg/m. If this is out of the aformentioned range listed, please consider follow up with your Primary Care Provider.   You have been scheduled for an endoscopy and colonoscopy. Please follow the written instructions given to you at your visit today. Please pick up your prep supplies at the pharmacy within the next 1-3 days. If you use inhalers (even only as needed), please bring them with you on the day of your procedure. Your physician has requested that you go to www.startemmi.com and enter the access code given to you at your visit today. This web site gives a general overview about your procedure. However, you should still follow specific instructions given to you by our office regarding your preparation for the procedure.   Thank you for entrusting me with your care and for choosing [facility], Dr. Hermosa Cellar

## 2017-11-23 NOTE — Progress Notes (Signed)
HPI :  67 year old male with a history of coronary artery disease, colon polyps, reflux, referred here by Chipper Herb, MD for discussion of colonoscopy and symptoms of dysphagia.  He reports he has had long-standing reflux, and often bothers him at night depending on what he eats. He has been using Prilosec 20 mg twice daily for a long time. This generally works quite well for him to minimize his reflux symptoms. He reports over time is noticed himself chewing his food extremely well and eating slowly, he often does this to minimize his symptoms of dysphagia. He reports dysphagia that been ongoing for some time which occurs intermittently. He thinks it occurs to both solids and liquids, we'll often feeling high up in his esophagus. No history of an impaction. No weight loss. He denies any family history of esophageal or gastric cancer. He is never had a prior endoscopy  He's been seen at our office last almost 6 years ago, Dr. Sharlett Iles saw him in 2014 for colonoscopy and he had one small adenoma removed. He denies any changes in bowel habits, no lives no blood in the stools. He denies any abdominal pains that bother him. He has a stable periumbilical hernia.  He denies any exertional symptoms of chest pain or shortness of breath at present.  Colonoscopy 02/16/2012 - Dr. Sharlett Iles - excellent prep, diverticulosis, 1 descending colon adenoma removed  Past Medical History:  Diagnosis Date  . Angina at rest [facility])   . Benign neoplasm of colon   . CAD (coronary artery disease)     Occluded RCA, 50% Diat 2006, 2008  . Diverticulosis of colon (without mention of hemorrhage)   . Esophageal reflux   . Other and unspecified hyperlipidemia   . Unspecified disorder of thyroid      Past Surgical History:  Procedure Laterality Date  . TONSILLECTOMY  1955   Family History  Problem Relation Age of Onset  . Hyperlipidemia Father   . Heart disease Father        pacemaker / CHF   . Dementia  Maternal Grandmother   . Cancer Maternal Grandfather        throat  . Alzheimer's disease Paternal Grandmother   . Cancer Paternal Grandfather        lung  . Cancer Sister        breast  . Colon cancer Neg Hx    Social History   Tobacco Use  . Smoking status: Former Smoker    Packs/day: 0.50    Types: Cigarettes    Last attempt to quit: 07/22/1986    Years since quitting: 31.3  . Smokeless tobacco: Never Used  Substance Use Topics  . Alcohol use: Yes    Alcohol/week: 1.0 standard drinks    Types: 1 Cans of beer per week  . Drug use: No   Current Outpatient Medications  Medication Sig Dispense Refill  . aspirin 81 MG EC tablet Take 81 mg by mouth daily.      . fluticasone (FLONASE) 50 MCG/ACT nasal spray Place 2 sprays into both nostrils daily. 16 g 11  . levothyroxine (SYNTHROID, LEVOTHROID) 100 MCG tablet TAKE ONE (1) TABLET EACH DAY 90 tablet 3  . levothyroxine (SYNTHROID, LEVOTHROID) 25 MCG tablet Take 0.5 tab on Mon, Wed and Fri mornings before breakfast. 36 tablet 3  . nitroGLYCERIN (NITROSTAT) 0.4 MG SL tablet Place 0.4 mg under the tongue every 5 (five) minutes as needed. Reported on 06/05/2015    . omeprazole (PRILOSEC)  20 MG capsule Take 2 caps po daily 180 capsule 3  . ranolazine (RANEXA) 500 MG 12 hr tablet Take 1 tablet (500 mg total) by mouth 2 (two) times daily. 180 tablet 3  . rosuvastatin (CRESTOR) 20 MG tablet TAKE ONE (1) TABLET EACH DAY 90 tablet 3  . Vitamin D, Ergocalciferol, (DRISDOL) 50000 units CAPS capsule TAKE 1 CAPSULE EVERY WEEK 12 capsule 3   No current facility-administered medications for this visit.    Allergies  Allergen Reactions  . Imdur [Isosorbide Nitrate]     Headache   . Lipitor [Atorvastatin] Other (See Comments)    myalgias     Review of Systems: All systems reviewed and negative except where noted in HPI.   Lab Results  Component Value Date   WBC 5.5 09/07/2017   HGB 13.9 09/07/2017   HCT 41.6 09/07/2017   MCV 89  09/07/2017   PLT 252 09/07/2017    Lab Results  Component Value Date   CREATININE 1.09 09/07/2017   BUN 10 09/07/2017   NA 137 09/07/2017   K 4.5 09/07/2017   CL 98 09/07/2017   CO2 21 09/07/2017    Lab Results  Component Value Date   ALT 30 09/07/2017   AST 23 09/07/2017   ALKPHOS 80 09/07/2017   BILITOT 0.4 09/07/2017     Physical Exam: BP 140/80   Pulse 70   Ht 6' 1.75" (1.873 m)   Wt 215 lb (97.5 kg)   BMI 27.79 kg/m  Constitutional: Pleasant,well-developed, male in no acute distress. HEENT: Normocephalic and atraumatic. Conjunctivae are normal. No scleral icterus. Neck supple.  Cardiovascular: Normal rate, regular rhythm.  Pulmonary/chest: Effort normal and breath sounds normal. No wheezing, rales or rhonchi. Abdominal: Soft, nondistended, nontender. . There are no masses palpable. No hepatomegaly. Extremities: no edema Lymphadenopathy: No cervical adenopathy noted. Neurological: Alert and oriented to person place and time. Skin: Skin is warm and dry. No rashes noted. Psychiatric: Normal mood and affect. Behavior is normal.   ASSESSMENT AND PLAN: 67 year old male here for new patient assessment of the following:  Dysphagia / GERD - I discussed the differential for dysphagia with him. Given his long-standing reflux, suspect peptic stricture however need to rule out mass lesion. He also meets criteria for screening for Barrett's esophagus. I'm recommending upper endoscopy to further evaluate and treat his dysphagia with dilation if amenable, and screen for BE. We otherwise discussed PPIs in general and long-term associated risks. I outlined these at length with him. Long-term recommend lowest-dose dose needed to control his reflux symptoms. Will await his upper endoscopy first and then make recommendations on long-term dosing. Discussion of risk and benefits of endoscopy and anesthesia done and he wanted to proceed. Further recommendations pending results  History  of colon polyps - he is due for surveillance colonoscopy. He has no concerning bowel symptoms. I discussed risks and benefits of colonoscopy and he wanted to proceed. We'll perform at the same time as his upper endoscopy.  Barclay Cellar, MD [facility]  CC: Chipper Herb, MD

## 2017-11-28 DIAGNOSIS — G4733 Obstructive sleep apnea (adult) (pediatric): Secondary | ICD-10-CM | POA: Diagnosis not present

## 2017-11-30 DIAGNOSIS — G4733 Obstructive sleep apnea (adult) (pediatric): Secondary | ICD-10-CM | POA: Diagnosis not present

## 2017-12-16 ENCOUNTER — Encounter: Payer: Self-pay | Admitting: Gastroenterology

## 2017-12-28 ENCOUNTER — Telehealth: Payer: Self-pay | Admitting: Gastroenterology

## 2017-12-28 NOTE — Telephone Encounter (Signed)
Hi Dr. Havery Moros, this pt cancelled his endo colon scheduled for tomorrow 12/29/17. He stated that he did not follow diet about not eating certain foods 5 days prior. I told pt that it is ok to still have procedure but he stated that he did not feel comfortable proceeding with proc. He will call back to reschedule.

## 2017-12-28 NOTE — Telephone Encounter (Signed)
I also think okay to proceed if he has a good bowel prep. If he does not wish to proceed he should be charged a cancellation fee given the late notice of this. Thanks for letting me know.

## 2017-12-29 ENCOUNTER — Encounter: Payer: Medicare HMO | Admitting: Gastroenterology

## 2017-12-29 DIAGNOSIS — G4733 Obstructive sleep apnea (adult) (pediatric): Secondary | ICD-10-CM | POA: Diagnosis not present

## 2017-12-31 DIAGNOSIS — G4733 Obstructive sleep apnea (adult) (pediatric): Secondary | ICD-10-CM | POA: Diagnosis not present

## 2018-01-14 DIAGNOSIS — H04123 Dry eye syndrome of bilateral lacrimal glands: Secondary | ICD-10-CM | POA: Diagnosis not present

## 2018-01-14 DIAGNOSIS — H26491 Other secondary cataract, right eye: Secondary | ICD-10-CM | POA: Diagnosis not present

## 2018-01-14 DIAGNOSIS — H02205 Unspecified lagophthalmos left lower eyelid: Secondary | ICD-10-CM | POA: Diagnosis not present

## 2018-01-14 DIAGNOSIS — H35371 Puckering of macula, right eye: Secondary | ICD-10-CM | POA: Diagnosis not present

## 2018-01-14 DIAGNOSIS — H02204 Unspecified lagophthalmos left upper eyelid: Secondary | ICD-10-CM | POA: Diagnosis not present

## 2018-01-18 DIAGNOSIS — R69 Illness, unspecified: Secondary | ICD-10-CM | POA: Diagnosis not present

## 2018-01-21 DIAGNOSIS — H26492 Other secondary cataract, left eye: Secondary | ICD-10-CM | POA: Diagnosis not present

## 2018-03-01 ENCOUNTER — Ambulatory Visit (INDEPENDENT_AMBULATORY_CARE_PROVIDER_SITE_OTHER): Payer: Medicare HMO | Admitting: Physician Assistant

## 2018-03-01 ENCOUNTER — Encounter: Payer: Self-pay | Admitting: Physician Assistant

## 2018-03-01 VITALS — BP 117/66 | HR 68 | Temp 97.7°F | Ht 73.75 in | Wt 216.0 lb

## 2018-03-01 DIAGNOSIS — R300 Dysuria: Secondary | ICD-10-CM | POA: Diagnosis not present

## 2018-03-01 DIAGNOSIS — Z125 Encounter for screening for malignant neoplasm of prostate: Secondary | ICD-10-CM | POA: Diagnosis not present

## 2018-03-01 DIAGNOSIS — N410 Acute prostatitis: Secondary | ICD-10-CM | POA: Diagnosis not present

## 2018-03-01 LAB — URINALYSIS
BILIRUBIN UA: NEGATIVE
Glucose, UA: NEGATIVE
KETONES UA: NEGATIVE
NITRITE UA: NEGATIVE
PROTEIN UA: NEGATIVE
SPEC GRAV UA: 1.015 (ref 1.005–1.030)
Urobilinogen, Ur: 0.2 mg/dL (ref 0.2–1.0)
pH, UA: 6 (ref 5.0–7.5)

## 2018-03-01 MED ORDER — SULFAMETHOXAZOLE-TRIMETHOPRIM 800-160 MG PO TABS: 1.0000 | ORAL_TABLET | Freq: Two times a day (BID) | ORAL | 0 refills | Status: DC

## 2018-03-02 LAB — URINE CULTURE

## 2018-03-03 NOTE — Progress Notes (Signed)
BP 117/66   Pulse 68   Temp 97.7 F (36.5 C) (Oral)   Ht 6' 1.75" (1.873 m)   Wt 216 lb (98 kg)   BMI 27.92 kg/m    Subjective:    Patient ID: [name]., male    DOB: 10/18/1950, 68 y.o.   MRN: [id number]  HPI: [name]. is a 68 y.o. male presenting on 03/01/2018 for Urinary Tract Infection  The patient comes in having 2 days worth of burning when he urinates.  It does not hurt at other times.  He denies any discharge from the penis.  He has never had a urinary tract infection in the past.  He does not complaining of any prostate pain or any pain in the perineal area when he sits or stands.  There is no change in the urine itself.  Last PSA in July was normal.  We will draw one today to look for any changes could be related to a prostatitis.  Past Medical History:  Diagnosis Date  . Angina at rest [facility])   . Benign neoplasm of colon   . CAD (coronary artery disease)     Occluded RCA, 50% Diat 2006, 2008  . Diverticulosis of colon (without mention of hemorrhage)   . Esophageal reflux   . Other and unspecified hyperlipidemia   . Unspecified disorder of thyroid    Relevant past medical, surgical, family and social history reviewed and updated as indicated. Interim medical history since our last visit reviewed. Allergies and medications reviewed and updated. DATA REVIEWED: CHART IN EPIC  Family History reviewed for pertinent findings.  Review of Systems  Constitutional: Negative.  Negative for appetite change and fatigue.  Eyes: Negative for pain and visual disturbance.  Respiratory: Negative.  Negative for cough, chest tightness, shortness of breath and wheezing.   Cardiovascular: Negative.  Negative for chest pain, palpitations and leg swelling.  Gastrointestinal: Positive for abdominal pain. Negative for diarrhea, nausea and vomiting.  Genitourinary: Positive for dysuria and frequency. Negative for difficulty urinating, discharge, enuresis, flank pain  and penile pain.  Skin: Negative.  Negative for color change and rash.  Neurological: Negative.  Negative for weakness, numbness and headaches.  Psychiatric/Behavioral: Negative.     Allergies as of 03/01/2018      Reactions   Imdur [isosorbide Nitrate]    Headache   Lipitor [atorvastatin] Other (See Comments)   myalgias      Medication List       Accurate as of March 01, 2018 11:59 PM. Always use your most recent med list.        aspirin 81 MG EC tablet Take 81 mg by mouth daily.   fluticasone 50 MCG/ACT nasal spray Commonly known as:  FLONASE Place 2 sprays into both nostrils daily.   levothyroxine 100 MCG tablet Commonly known as:  SYNTHROID, LEVOTHROID TAKE ONE (1) TABLET EACH DAY   levothyroxine 25 MCG tablet Commonly known as:  SYNTHROID, LEVOTHROID Take 0.5 tab on Mon, Wed and Fri mornings before breakfast.   nitroGLYCERIN 0.4 MG SL tablet Commonly known as:  NITROSTAT Place 0.4 mg under the tongue every 5 (five) minutes as needed. Reported on 06/05/2015   omeprazole 20 MG capsule Commonly known as:  PRILOSEC Take 2 caps po daily   ranolazine 500 MG 12 hr tablet Commonly known as:  RANEXA Take 1 tablet (500 mg total) by mouth 2 (two) times daily.   rosuvastatin 20 MG tablet Commonly known  as:  CRESTOR TAKE ONE (1) TABLET EACH DAY   sulfamethoxazole-trimethoprim 800-160 MG tablet Commonly known as:  BACTRIM DS,SEPTRA DS Take 1 tablet by mouth 2 (two) times daily.   SUPREP BOWEL PREP KIT 17.5-3.13-1.6 GM/177ML Soln Generic drug:  Na Sulfate-K Sulfate-Mg Sulf Suprep-Use as directed   Vitamin D (Ergocalciferol) 1.25 MG (50000 UT) Caps capsule Commonly known as:  DRISDOL TAKE 1 CAPSULE EVERY WEEK          Objective:    BP 117/66   Pulse 68   Temp 97.7 F (36.5 C) (Oral)   Ht 6' 1.75" (1.873 m)   Wt 216 lb (98 kg)   BMI 27.92 kg/m   Allergies  Allergen Reactions  . Imdur [Isosorbide Nitrate]     Headache   . Lipitor [Atorvastatin]  Other (See Comments)    myalgias    Wt Readings from Last 3 Encounters:  03/01/18 216 lb (98 kg)  11/23/17 215 lb (97.5 kg)  10/14/17 213 lb 3.2 oz (96.7 kg)    Physical Exam Vitals signs and nursing note reviewed.  Constitutional:      General: He is not in acute distress.    Appearance: He is well-developed.  HENT:     Head: Normocephalic and atraumatic.  Eyes:     Conjunctiva/sclera: Conjunctivae normal.     Pupils: Pupils are equal, round, and reactive to light.  Cardiovascular:     Rate and Rhythm: Normal rate and regular rhythm.     Heart sounds: Normal heart sounds.  Pulmonary:     Effort: Pulmonary effort is normal. No respiratory distress.     Breath sounds: Normal breath sounds.  Abdominal:     Tenderness: There is no abdominal tenderness.  Skin:    General: Skin is warm and dry.  Psychiatric:        Behavior: Behavior normal.     Results for orders placed or performed in visit on 03/01/18  Urine Culture  Result Value Ref Range   Urine Culture, Routine Final report    Organism ID, Bacteria Comment   Urinalysis  Result Value Ref Range   Specific Gravity, UA 1.015 1.005 - 1.030   pH, UA 6.0 5.0 - 7.5   Color, UA Yellow Yellow   Appearance Ur Clear Clear   Leukocytes, UA Trace (A) Negative   Protein, UA Negative Negative/Trace   Glucose, UA Negative Negative   Ketones, UA Negative Negative   RBC, UA 1+ (A) Negative   Bilirubin, UA Negative Negative   Urobilinogen, Ur 0.2 0.2 - 1.0 mg/dL   Nitrite, UA Negative Negative      Assessment & Plan:   1. Dysuria - Urine Culture - Urinalysis - PSA  2. Acute prostatitis - PSA - sulfamethoxazole-trimethoprim (BACTRIM DS,SEPTRA DS) 800-160 MG tablet; Take 1 tablet by mouth 2 (two) times daily.  Dispense: 20 tablet; Refill: 0    Continue all other maintenance medications as listed above.  Follow up plan: No follow-ups on file.  Educational handout given for [place] PA-C [facility] [address]  [place], [place] [address] [phone]   03/03/2018, 8:11 AM

## 2018-03-05 ENCOUNTER — Encounter: Payer: Self-pay | Admitting: Family Medicine

## 2018-03-05 ENCOUNTER — Ambulatory Visit (INDEPENDENT_AMBULATORY_CARE_PROVIDER_SITE_OTHER): Payer: Medicare HMO | Admitting: Family Medicine

## 2018-03-05 VITALS — BP 127/73 | HR 63 | Temp 97.4°F | Ht 73.75 in | Wt 213.0 lb

## 2018-03-05 DIAGNOSIS — E782 Mixed hyperlipidemia: Secondary | ICD-10-CM

## 2018-03-05 DIAGNOSIS — E559 Vitamin D deficiency, unspecified: Secondary | ICD-10-CM | POA: Diagnosis not present

## 2018-03-05 DIAGNOSIS — I700 Atherosclerosis of aorta: Secondary | ICD-10-CM

## 2018-03-05 DIAGNOSIS — E034 Atrophy of thyroid (acquired): Secondary | ICD-10-CM

## 2018-03-05 DIAGNOSIS — K429 Umbilical hernia without obstruction or gangrene: Secondary | ICD-10-CM | POA: Diagnosis not present

## 2018-03-05 DIAGNOSIS — Z7689 Persons encountering health services in other specified circumstances: Secondary | ICD-10-CM | POA: Diagnosis not present

## 2018-03-05 DIAGNOSIS — K219 Gastro-esophageal reflux disease without esophagitis: Secondary | ICD-10-CM | POA: Diagnosis not present

## 2018-03-05 LAB — PSA: PROSTATE SPECIFIC AG, SERUM: 3.5 ng/mL (ref 0.0–4.0)

## 2018-03-05 NOTE — Progress Notes (Signed)
Subjective:    Patient ID: Corey Ochs., male    DOB: 07-13-1950, 68 y.o.   MRN: [id number]  HPI  Pt here for follow up and management of chronic medical problems which includes hypothyroid and hyperlipidemia. He is taking medication regularly.  Patient comes to the visit today for his regular checkup.  He was given an FOBT to return and he will get lab work.  He refuses to take flu shot and Prevnar vaccines.  His vital signs are stable his weight is down a couple pounds since the last visit here.  He was scheduled to get a colonoscopy in December of this past year and did not get this and he plans to reschedule this.  He has a history of colon polyps.  He has abdominal aortic atherosclerosis hyperlipidemia hypothyroidism and obstructive sleep apnea.  He also has coronary artery disease.  He did see Dr. Percival Spanish, the cardiologist in August.  He plans to see him back again in a year.  Actually, Dr. Marlou Porch will be seeing back on return.  The patient has had hyperlipidemia but is hard to get him to take his medicines regularly.  He does have elevated triglycerides.  The note from Dr. Percival Spanish was reviewed.  The patient saw his ophthalmologist about a week ago, Dr. Bing Plume.  The patient today denies any chest pain pressure tightness or shortness of breath.  He denies any trouble with his stomach including nausea vomiting diarrhea blood in the stool or black tarry bowel movements.  He plans to call back and set up an appointment for his colonoscopy with Dr. Havery Moros himself.  He is passing his water well and he was recently started on some sulfa medicine by 1 of our providers because of burning.  Make sure that he will take this with some food and that he repeats a urinalysis when he finishes the round of antibiotics.   Patient Active Problem List   Diagnosis Date Noted  . Obstructive sleep apnea 10/14/2017  . Abdominal aortic atherosclerosis ([place]) 08/05/2016  . Diverticulosis of colon (without  mention of hemorrhage)   . Hyperlipidemia LDL goal <70   . Benign neoplasm of colon   . COLONIC POLYPS 06/12/2007  . Hypothyroidism 06/12/2007  . Coronary artery disease 06/12/2007  . CAD 06/12/2007  . GERD 06/12/2007  . Diverticulosis of colon 06/12/2007  . GUAIAC POSITIVE STOOL 06/12/2007   Outpatient Encounter Medications as of 03/05/2018  Medication Sig  . aspirin 81 MG EC tablet Take 81 mg by mouth daily.    . fluticasone (FLONASE) 50 MCG/ACT nasal spray Place 2 sprays into both nostrils daily.  Marland Kitchen levothyroxine (SYNTHROID, LEVOTHROID) 100 MCG tablet TAKE ONE (1) TABLET EACH DAY  . levothyroxine (SYNTHROID, LEVOTHROID) 25 MCG tablet Take 0.5 tab on Mon, Wed and Fri mornings before breakfast.  . nitroGLYCERIN (NITROSTAT) 0.4 MG SL tablet Place 0.4 mg under the tongue every 5 (five) minutes as needed. Reported on 06/05/2015  . omeprazole (PRILOSEC) 20 MG capsule Take 2 caps po daily  . ranolazine (RANEXA) 500 MG 12 hr tablet Take 1 tablet (500 mg total) by mouth 2 (two) times daily.  . rosuvastatin (CRESTOR) 20 MG tablet TAKE ONE (1) TABLET EACH DAY  . sulfamethoxazole-trimethoprim (BACTRIM DS,SEPTRA DS) 800-160 MG tablet Take 1 tablet by mouth 2 (two) times daily.  . Vitamin D, Ergocalciferol, (DRISDOL) 50000 units CAPS capsule TAKE 1 CAPSULE EVERY WEEK  . SUPREP BOWEL PREP KIT 17.5-3.13-1.6 GM/177ML SOLN Suprep-Use as directed (Patient  not taking: Reported on 03/05/2018)   No facility-administered encounter medications on file as of 03/05/2018.      Review of Systems  Constitutional: Negative.   HENT: Negative.   Eyes: Negative.   Respiratory: Negative.   Cardiovascular: Negative.   Gastrointestinal: Negative.   Endocrine: Negative.   Genitourinary: Negative.   Musculoskeletal: Negative.   Skin: Negative.   Allergic/Immunologic: Negative.   Neurological: Negative.   Hematological: Negative.   Psychiatric/Behavioral: Negative.        Objective:   Physical Exam Vitals  signs and nursing note reviewed.  Constitutional:      General: He is not in acute distress.    Appearance: Normal appearance. He is well-developed. He is not ill-appearing.     Comments: Patient is pleasant and kind and plans to set up his follow-up visit with the gastroenterologist to get his colonoscopy.  HENT:     Head: Normocephalic and atraumatic.     Right Ear: Tympanic membrane, ear canal and external ear normal. There is no impacted cerumen.     Left Ear: Tympanic membrane, ear canal and external ear normal. There is no impacted cerumen.     Nose: Congestion present.     Comments: Patient does have Flonase and will restart this and uses nasal saline periodically.    Mouth/Throat:     Mouth: Mucous membranes are moist.     Pharynx: Oropharynx is clear. No oropharyngeal exudate.  Eyes:     General: No scleral icterus.       Right eye: No discharge.        Left eye: No discharge.     Extraocular Movements: Extraocular movements intact.     Conjunctiva/sclera: Conjunctivae normal.     Pupils: Pupils are equal, round, and reactive to light.  Neck:     Musculoskeletal: Normal range of motion and neck supple.     Thyroid: No thyromegaly.     Vascular: No carotid bruit.     Trachea: No tracheal deviation.     Comments: No bruits thyromegaly or anterior cervical adenopathy Cardiovascular:     Rate and Rhythm: Normal rate and regular rhythm.     Heart sounds: Normal heart sounds. No murmur. No gallop.      Comments: Heart is regular at 72/min with good pedal pulses and no edema Pulmonary:     Effort: Pulmonary effort is normal.     Breath sounds: Normal breath sounds. No wheezing or rales.     Comments: No chest wall tenderness masses or axillary adenopathy Chest:     Chest wall: No tenderness.  Abdominal:     General: Abdomen is flat. Bowel sounds are normal.     Palpations: Abdomen is soft. There is no mass.     Tenderness: There is no abdominal tenderness.     Hernia: A  hernia is present.     Comments: No liver or spleen enlargement, no masses no epigastric tenderness and no inguinal adenopathy.  There was a small umbilical hernia.  Musculoskeletal: Normal range of motion.        General: No tenderness.     Right lower leg: No edema.     Left lower leg: No edema.  Lymphadenopathy:     Cervical: No cervical adenopathy.  Skin:    General: Skin is warm and dry.     Findings: No rash.  Neurological:     General: No focal deficit present.     Mental Status: He is alert and  oriented to person, place, and time. Mental status is at baseline.     Cranial Nerves: No cranial nerve deficit.     Deep Tendon Reflexes: Reflexes are normal and symmetric. Reflexes normal.  Psychiatric:        Mood and Affect: Mood normal.        Behavior: Behavior normal.        Thought Content: Thought content normal.        Judgment: Judgment normal.    BP 127/73 (BP Location: Left Arm)   Pulse 63   Temp (!) 97.4 F (36.3 C) (Oral)   Ht 6' 1.75" (1.873 m)   Wt 213 lb (96.6 kg)   BMI 27.53 kg/m         Assessment & Plan:  1. Mixed hyperlipidemia -Patient has coronary artery disease that has been documented but unfortunately will not take his medication regularly.  We will wait till the results are returned and we will re-encourage any treatment recommendations following these results - BMP8+EGFR - CBC with Differential/Platelet - Lipid panel  2. Vitamin D deficiency -Continue vitamin D replacement pending results of lab work - CBC with Differential/Platelet - VITAMIN D 25 Hydroxy (Vit-D Deficiency, Fractures)  3. Hypothyroidism due to acquired atrophy of thyroid -Continue with thyroid replacement pending results of lab work - CBC with Differential/Platelet  4. Gastroesophageal reflux disease, esophagitis presence not specified -No complaints today with reflux.  Patient is aware that he is due to get a colonoscopy and plans to call Dr. Havery Moros up to follow-up  on this. - CBC with Differential/Platelet - Hepatic function panel  5. Abdominal aortic atherosclerosis ([place]) -Continue aggressive therapeutic lifestyle changes with treatment recommendations to follow lab work  6. Aortic atherosclerosis ([place]) -Any aggressive therapeutic lifestyle changes  7. Umbilical hernia without obstruction and without gangrene -No complaints today or problems with this.   Patient Instructions                       Medicare Annual Wellness Visit  [facility] and the medical providers at [facility] strive to bring you the best medical care.  In doing so we not only want to address your current medical conditions and concerns but also to detect new conditions early and prevent illness, disease and health-related problems.    Medicare offers a yearly Wellness Visit which allows our clinical staff to assess your need for preventative services including immunizations, lifestyle education, counseling to decrease risk of preventable diseases and screening for fall risk and other medical concerns.    This visit is provided free of charge (no copay) for all Medicare recipients. The clinical pharmacists at [facility] have begun to conduct these Wellness Visits which will also include a thorough review of all your medications.    As you primary medical provider recommend that you make an appointment for your Annual Wellness Visit if you have not done so already this year.  You may set up this appointment before you leave today or you may call back ([phone]) and schedule an appointment.  Please make sure when you call that you mention that you are scheduling your Annual Wellness Visit with the clinical pharmacist so that the appointment may be made for the proper length of time.     Continue current medications. Continue good therapeutic lifestyle changes which include good diet and exercise. Fall precautions discussed with  patient. If an FOBT was given today- please return it  to our front desk. If you are over 25 years old - you may need Prevnar 51 or the adult Pneumonia vaccine.  **Flu shots are available--- please call and schedule a FLU-CLINIC appointment**  After your visit with [place] today you will receive a survey in the mail or online from [employer] regarding your care with [place]. Please take a moment to fill this out. Your feedback is very important to [place] as you can help [place] better understand your patient needs as well as improve your experience and satisfaction. WE CARE ABOUT YOU!!!   Do not forget to call up Dr. Havery Moros and make the appointment for your colonoscopy since it is past due Continue to take the sulfa medicine and take it with food twice daily and make sure that we recheck a urinalysis when the antibiotic is completed Continue to drink plenty of water When you see the ophthalmologist make sure that we get a copy of the eye exam report sent to [place]. We will call with the lab results as soon as those results become available Since he is going to get a colonoscopy will not give him an FOBT card.  Arrie Senate MD

## 2018-03-05 NOTE — Patient Instructions (Addendum)
Medicare Annual Wellness Visit  [facility] and the medical providers at [facility] strive to bring you the best medical care.  In doing so we not only want to address your current medical conditions and concerns but also to detect new conditions early and prevent illness, disease and health-related problems.    Medicare offers a yearly Wellness Visit which allows our clinical staff to assess your need for preventative services including immunizations, lifestyle education, counseling to decrease risk of preventable diseases and screening for fall risk and other medical concerns.    This visit is provided free of charge (no copay) for all Medicare recipients. The clinical pharmacists at [facility] have begun to conduct these Wellness Visits which will also include a thorough review of all your medications.    As you primary medical provider recommend that you make an appointment for your Annual Wellness Visit if you have not done so already this year.  You may set up this appointment before you leave today or you may call back ([phone]) and schedule an appointment.  Please make sure when you call that you mention that you are scheduling your Annual Wellness Visit with the clinical pharmacist so that the appointment may be made for the proper length of time.     Continue current medications. Continue good therapeutic lifestyle changes which include good diet and exercise. Fall precautions discussed with patient. If an FOBT was given today- please return it to our front desk. If you are over 52 years old - you may need Prevnar 74 or the adult Pneumonia vaccine.  **Flu shots are available--- please call and schedule a FLU-CLINIC appointment**  After your visit with [place] today you will receive a survey in the mail or online from [employer] regarding your care with [place]. Please take a moment to fill this out. Your feedback is very  important to [place] as you can help [place] better understand your patient needs as well as improve your experience and satisfaction. WE CARE ABOUT YOU!!!   Do not forget to call up Dr. Havery Moros and make the appointment for your colonoscopy since it is past due Continue to take the sulfa medicine and take it with food twice daily and make sure that we recheck a urinalysis when the antibiotic is completed Continue to drink plenty of water When you see the ophthalmologist make sure that we get a copy of the eye exam report sent to [place]. We will call with the lab results as soon as those results become available Since he is going to get a colonoscopy will not give him an FOBT card.

## 2018-03-06 LAB — BMP8+EGFR
BUN / CREAT RATIO: 12 (ref 10–24)
BUN: 13 mg/dL (ref 8–27)
CALCIUM: 9.6 mg/dL (ref 8.6–10.2)
CHLORIDE: 96 mmol/L (ref 96–106)
CO2: 20 mmol/L (ref 20–29)
Creatinine, Ser: 1.13 mg/dL (ref 0.76–1.27)
GFR calc non Af Amer: 66 mL/min/{1.73_m2} (ref >59)
GFR, EST AFRICAN AMERICAN: 76 mL/min/{1.73_m2} (ref >59)
Glucose: 102 mg/dL — ABNORMAL HIGH (ref 65–99)
POTASSIUM: 4.4 mmol/L (ref 3.5–5.2)
SODIUM: 141 mmol/L (ref 134–144)

## 2018-03-06 LAB — CBC WITH DIFFERENTIAL/PLATELET
Basophils Absolute: 0.1 10*3/uL (ref 0.0–0.2)
Basos: 1 %
EOS (ABSOLUTE): 0.2 10*3/uL (ref 0.0–0.4)
Eos: 3 %
Hematocrit: 40.9 % (ref 37.5–51.0)
Hemoglobin: 13.5 g/dL (ref 13.0–17.7)
Immature Grans (Abs): 0 10*3/uL (ref 0.0–0.1)
Immature Granulocytes: 0 %
Lymphocytes Absolute: 1.7 10*3/uL (ref 0.7–3.1)
Lymphs: 30 %
MCH: 29.1 pg (ref 26.6–33.0)
MCHC: 33 g/dL (ref 31.5–35.7)
MCV: 88 fL (ref 79–97)
Monocytes Absolute: 0.5 10*3/uL (ref 0.1–0.9)
Monocytes: 9 %
Neutrophils Absolute: 3.3 10*3/uL (ref 1.4–7.0)
Neutrophils: 57 %
Platelets: 282 10*3/uL (ref 150–450)
RBC: 4.64 x10E6/uL (ref 4.14–5.80)
RDW: 14.1 % (ref 11.6–15.4)
WBC: 5.8 10*3/uL (ref 3.4–10.8)

## 2018-03-06 LAB — HEPATIC FUNCTION PANEL
ALT: 26 IU/L (ref 0–44)
AST: 22 IU/L (ref 0–40)
Albumin: 4.7 g/dL (ref 3.8–4.8)
Alkaline Phosphatase: 88 IU/L (ref 39–117)
Bilirubin Total: 0.4 mg/dL (ref 0.0–1.2)
Bilirubin, Direct: 0.13 mg/dL (ref 0.00–0.40)
Total Protein: 7.6 g/dL (ref 6.0–8.5)

## 2018-03-06 LAB — LIPID PANEL
Chol/HDL Ratio: 6.7 ratio — ABNORMAL HIGH (ref 0.0–5.0)
Cholesterol, Total: 236 mg/dL — ABNORMAL HIGH (ref 100–199)
HDL: 35 mg/dL — ABNORMAL LOW (ref >39)
Triglycerides: 893 mg/dL (ref 0–149)

## 2018-03-06 LAB — VITAMIN D 25 HYDROXY (VIT D DEFICIENCY, FRACTURES): VIT D 25 HYDROXY: 28.7 ng/mL — AB (ref 30.0–100.0)

## 2018-03-08 ENCOUNTER — Other Ambulatory Visit: Payer: Self-pay | Admitting: *Deleted

## 2018-03-08 DIAGNOSIS — E782 Mixed hyperlipidemia: Secondary | ICD-10-CM

## 2018-03-08 MED ORDER — ICOSAPENT ETHYL 1 G PO CAPS: 2.0000 g | ORAL_CAPSULE | Freq: Two times a day (BID) | ORAL | 2 refills | Status: DC

## 2018-03-09 LAB — SPECIMEN STATUS REPORT

## 2018-03-09 LAB — AMYLASE: Amylase: 43 U/L (ref 31–110)

## 2018-03-17 IMAGING — CR DG ABDOMEN 1V
1 series · 1 of 1 positions shown · non-contrast
Comparison: None.

CLINICAL DATA: Upper abdominal pain, right upper quadrant
tenderness.

EXAM:
ABDOMEN - 1 VIEW

[view not recorded]
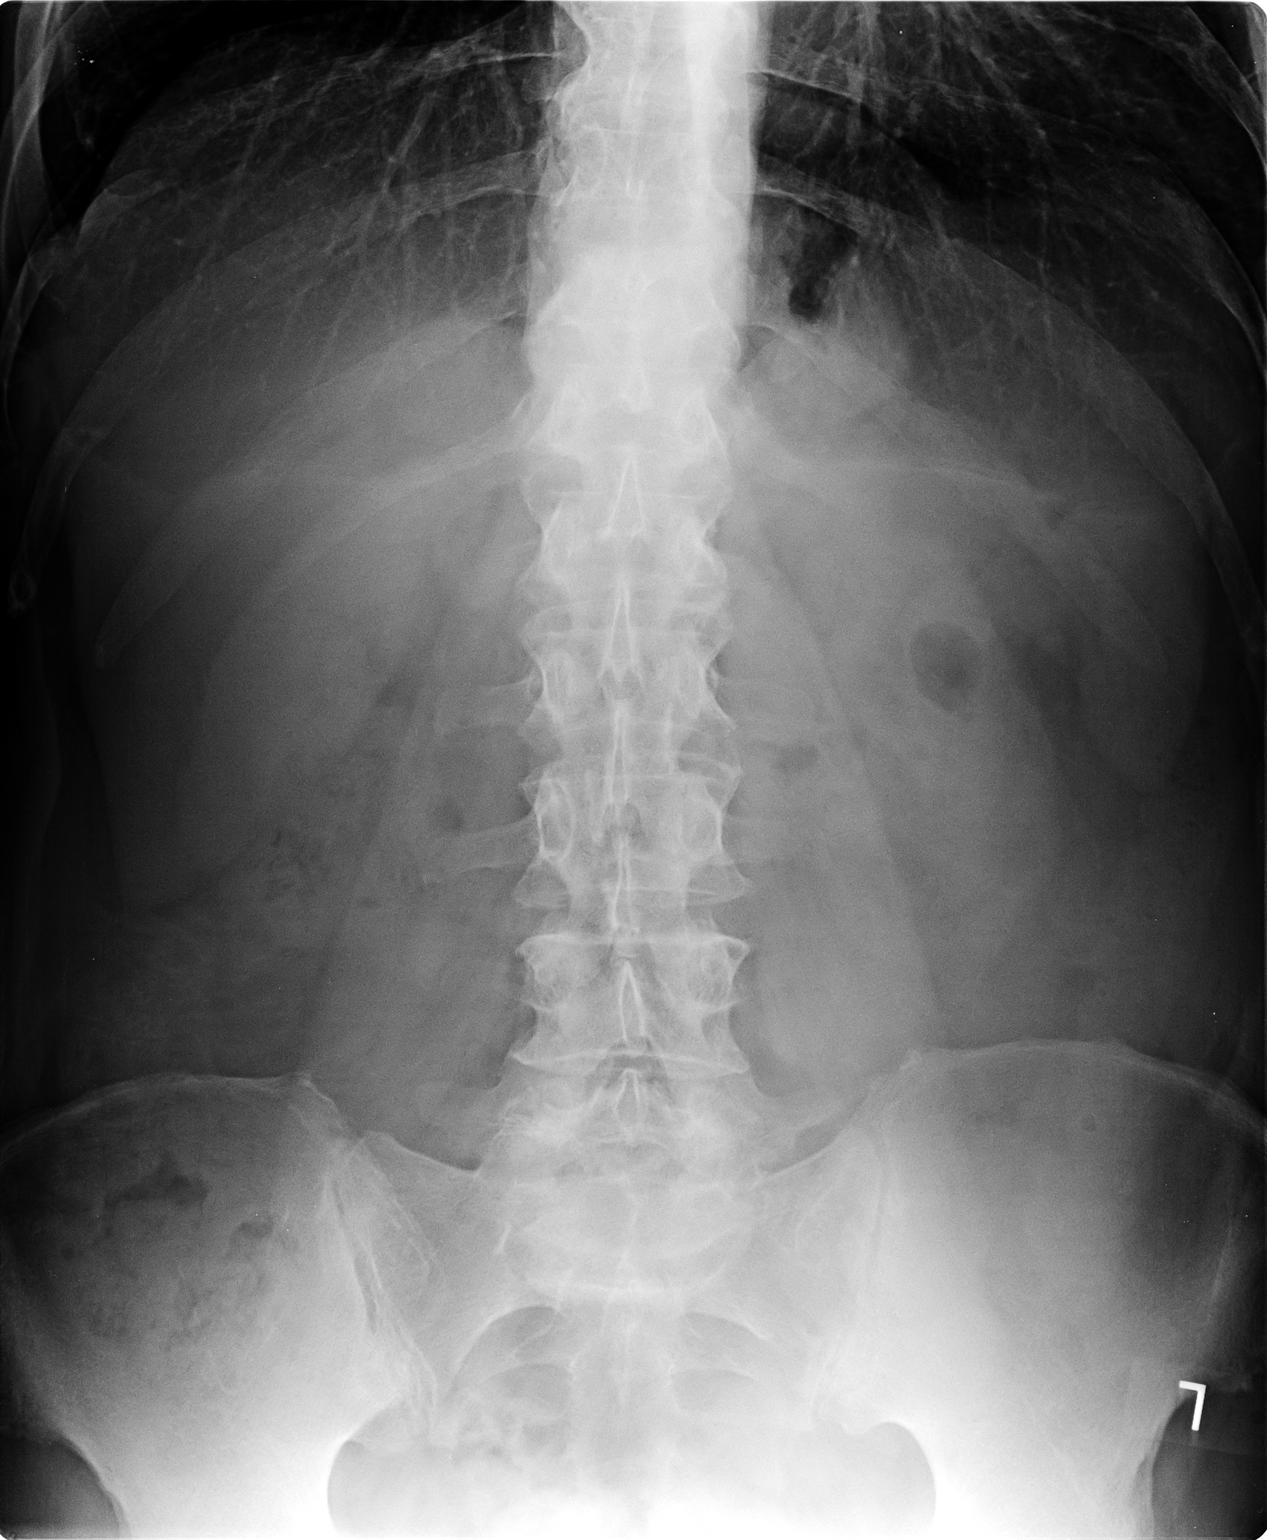

[1 of 1 positions shown; findings below may reference images not displayed]

FINDINGS: The bowel gas pattern is normal. No dilated bowel loops. Normal
stool burden. No radio-opaque calculi. No acute osseous
abnormalities.
IMPRESSION: Negative single-view abdominal radiographs.

## 2018-04-07 ENCOUNTER — Encounter: Payer: Self-pay | Admitting: Pharmacist Clinician (PhC)/ Clinical Pharmacy Specialist

## 2018-04-07 ENCOUNTER — Ambulatory Visit (INDEPENDENT_AMBULATORY_CARE_PROVIDER_SITE_OTHER): Payer: Medicare HMO | Admitting: Pharmacist Clinician (PhC)/ Clinical Pharmacy Specialist

## 2018-04-07 VITALS — BP 125/68 | HR 68

## 2018-04-07 DIAGNOSIS — E038 Other specified hypothyroidism: Secondary | ICD-10-CM

## 2018-04-07 DIAGNOSIS — E782 Mixed hyperlipidemia: Secondary | ICD-10-CM

## 2018-04-07 MED ORDER — CHOLINE FENOFIBRATE 135 MG PO CPDR: 135.0000 mg | DELAYED_RELEASE_CAPSULE | Freq: Every day | ORAL | 3 refills | Status: DC

## 2018-04-07 MED ORDER — CHOLINE FENOFIBRATE 45 MG PO CPDR: 45.0000 mg | DELAYED_RELEASE_CAPSULE | Freq: Every day | ORAL | 3 refills | Status: DC

## 2018-04-07 MED ORDER — EZETIMIBE-SIMVASTATIN 10-20 MG PO TABS: 1.0000 | ORAL_TABLET | Freq: Every day | ORAL | 3 refills | Status: DC

## 2018-04-07 NOTE — Progress Notes (Addendum)
Lipid Clinic Consultation  Chief Complaint:  No chief complaint on file.    Exam Regularity:  RRR Edema:  neg Respirations:  18   Carotid Bruits:  neg Xanthomas:  neg General Appearance:  alert, oriented, no acute distress Mood/Affect:  normal  HPI:  Long standing hypertriglyceridemia for over 20 years per patient.  Dad had CAD and one sister who is deceased.  Patient did not take Vascepa either time it was prescribed due to a high copay.  I counseled patient on risks of high TG:  Pancreatitis and CVD.  He has no symptoms of pancreatitis and amylase was normal.     Component Value Date/Time   CHOL 236 (H) 03/05/2018 1159   CHOL 202 (H) 10/15/2012 0854   TRIG 893 (HH) 03/05/2018 1159   TRIG 590 (HH) 06/08/2014 1703   TRIG 241 (H) 10/15/2012 0854   HDL 35 (L) 03/05/2018 1159   HDL 43 06/08/2014 1703   HDL 39 (L) 10/15/2012 0854   LDLDIRECT 72 05/13/2016 1100   LDLDIRECT 124.9 01/18/2007 0846   VLDL 55 (H) 01/18/2007 0846   CHOLHDL 6.7 (H) 03/05/2018 1159   CHOLHDL 5.9 CALC 01/18/2007 0846    Assessment: CHD/CHF Risk Equivalents:  CAD [place] Estd 68yrs risk:  N/A NCEP Risk Factors Present:  HTN, family history, low HDL and age Primary Problem(s):  TG elevated  Current NCEP Goals: LDL Goal < 70 HDL Goal >/= 40 Tg Goal < 150 Non-HDL Goal < 100  Secondary cause of hyperlipidemia present:  Hypothyroidism, last TSH in 08/2017 was elevated and synthroid dose was adjusted.  Labs to re-check TSH done today. Glucose and liver function labs normal. Low fat diet followed?  Yes -   Low carb diet followed?  No - diet high in sugars and bread.  Ice cream late at night, OJ daily, mixed drink every two weeks, regular 7-up daily. Exercise?  No - recent hernia and foot injury.  He is almost better and can start walking.  [facility]  Recommendations: Changes in lipid medication(s):  Change crestor to Vytorin 10/20mg  and add  Trilipix 45mg  (GFR is 66mg /dL).  Simvastatin dose is capped at 20mg  when taken with Ranexa.  Patient is to call immediately with any signs of muscle aches or pains or discoloration of urine or stool.  Recheck Lipid Panel:  6 weeks Other labs needed:  BMP to check renal function.  Would expect to see a bump in creatinine with fenofibrate that is not related to a decline in renal function.   Time spent counseling patient:  35 minutes  Physician time spent with patient:  0 Referring Provider:  Laurance Flatten   PharmD:  Memory Argue, PharmD, CLS, CPP

## 2018-04-07 NOTE — Addendum Note (Signed)
Addended by: Memory Argue on: 04/07/2018 01:03 PM   Modules accepted: Orders

## 2018-04-08 LAB — TSH: TSH: 3.62 u[IU]/mL (ref 0.450–4.500)

## 2018-05-19 ENCOUNTER — Telehealth: Payer: Self-pay | Admitting: Family Medicine

## 2018-05-19 NOTE — Telephone Encounter (Signed)
Pt is wanting to talk to Duncan B. About his ezetimibe-simvastatin (VYTORIN) 10-20 MG tablet only wants to talk to her, he is aware she is not in office this week.

## 2018-05-19 NOTE — Telephone Encounter (Signed)
Please review and advise.

## 2018-05-26 ENCOUNTER — Other Ambulatory Visit: Payer: Self-pay | Admitting: Pharmacist Clinician (PhC)/ Clinical Pharmacy Specialist

## 2018-05-26 MED ORDER — CHOLINE FENOFIBRATE 45 MG PO CPDR: 45.0000 mg | DELAYED_RELEASE_CAPSULE | Freq: Every day | ORAL | 3 refills | Status: DC

## 2018-05-26 NOTE — Telephone Encounter (Signed)
Called patient back.  He had not switched to Vytorin yet and is still taking Crestor with Trilipix.  He is going to come in for labs once he feels comfortable with Covid-19 dying down and restrictions lifted.  I sent in a refill for his Trilipix.  He is not having any myalgias or changes in the color of his stool or urine.  No abdominal pain. I did let him know that between 8-9am tomorrow morning there are currently no patients scheduled in office and he can always call beforehand to ask the front desk.  But to only come if he feels comfortable.  He does own a mask.

## 2018-07-21 ENCOUNTER — Other Ambulatory Visit: Payer: Self-pay | Admitting: Family Medicine

## 2018-07-26 DIAGNOSIS — R69 Illness, unspecified: Secondary | ICD-10-CM | POA: Diagnosis not present

## 2018-07-29 ENCOUNTER — Other Ambulatory Visit: Payer: Self-pay | Admitting: Family Medicine

## 2018-08-05 DIAGNOSIS — R69 Illness, unspecified: Secondary | ICD-10-CM | POA: Diagnosis not present

## 2018-09-03 ENCOUNTER — Ambulatory Visit: Payer: Medicare HMO | Admitting: Family Medicine

## 2018-09-14 ENCOUNTER — Other Ambulatory Visit: Payer: Self-pay

## 2018-09-15 ENCOUNTER — Ambulatory Visit (INDEPENDENT_AMBULATORY_CARE_PROVIDER_SITE_OTHER): Payer: Medicare HMO | Admitting: Family Medicine

## 2018-09-15 ENCOUNTER — Encounter: Payer: Self-pay | Admitting: Family Medicine

## 2018-09-15 VITALS — BP 129/74 | HR 57 | Temp 99.6°F | Ht 73.75 in | Wt 217.0 lb

## 2018-09-15 DIAGNOSIS — K219 Gastro-esophageal reflux disease without esophagitis: Secondary | ICD-10-CM | POA: Diagnosis not present

## 2018-09-15 DIAGNOSIS — I2510 Atherosclerotic heart disease of native coronary artery without angina pectoris: Secondary | ICD-10-CM

## 2018-09-15 DIAGNOSIS — E559 Vitamin D deficiency, unspecified: Secondary | ICD-10-CM | POA: Insufficient documentation

## 2018-09-15 DIAGNOSIS — Z1211 Encounter for screening for malignant neoplasm of colon: Secondary | ICD-10-CM

## 2018-09-15 DIAGNOSIS — E039 Hypothyroidism, unspecified: Secondary | ICD-10-CM

## 2018-09-15 DIAGNOSIS — Z1212 Encounter for screening for malignant neoplasm of rectum: Secondary | ICD-10-CM

## 2018-09-15 DIAGNOSIS — I2583 Coronary atherosclerosis due to lipid rich plaque: Secondary | ICD-10-CM

## 2018-09-15 DIAGNOSIS — M65331 Trigger finger, right middle finger: Secondary | ICD-10-CM | POA: Insufficient documentation

## 2018-09-15 DIAGNOSIS — E782 Mixed hyperlipidemia: Secondary | ICD-10-CM

## 2018-09-15 MED ORDER — VITAMIN D (ERGOCALCIFEROL) 1.25 MG (50000 UNIT) PO CAPS: ORAL_CAPSULE | ORAL | 3 refills | Status: DC

## 2018-09-15 MED ORDER — LEVOTHYROXINE SODIUM 25 MCG PO TABS: ORAL_TABLET | ORAL | 3 refills | Status: DC

## 2018-09-15 MED ORDER — RANOLAZINE ER 500 MG PO TB12: 500.0000 mg | ORAL_TABLET | Freq: Two times a day (BID) | ORAL | 3 refills | Status: DC

## 2018-09-15 MED ORDER — FENOFIBRIC ACID 45 MG PO CPDR: DELAYED_RELEASE_CAPSULE | ORAL | 3 refills | Status: DC

## 2018-09-15 MED ORDER — PREDNISONE 20 MG PO TABS: ORAL_TABLET | ORAL | 0 refills | Status: DC

## 2018-09-15 MED ORDER — LEVOTHYROXINE SODIUM 100 MCG PO TABS: ORAL_TABLET | ORAL | 2 refills | Status: DC

## 2018-09-15 MED ORDER — OMEPRAZOLE 20 MG PO CPDR: DELAYED_RELEASE_CAPSULE | ORAL | 3 refills | Status: DC

## 2018-09-15 MED ORDER — ROSUVASTATIN CALCIUM 20 MG PO TABS: 20.0000 mg | ORAL_TABLET | Freq: Every day | ORAL | 6 refills | Status: DC

## 2018-09-15 MED ORDER — DICLOFENAC SODIUM 1 % TD GEL: 2.0000 g | Freq: Four times a day (QID) | TRANSDERMAL | 0 refills | Status: AC

## 2018-09-15 NOTE — Progress Notes (Signed)
Subjective:  Patient ID: [name]., male    DOB: [birth year], 68 y.o.   MRN: [id number]  Patient Care Team: Baruch Gouty, FNP as PCP - General (Family Medicine) Pyrtle, Lajuan Lines, MD as Consulting Physician (Gastroenterology) Laurin Coder, MD as Consulting Physician (Pulmonary Disease) Minus Breeding, MD as Consulting Physician (Cardiology)   Chief Complaint:  Medical Management of Chronic Issues (6 mo), Hyperlipidemia, Hypothyroidism, and Gastroesophageal Reflux   HPI: [name]. is a 68 y.o. male presenting on 09/15/2018 for Medical Management of Chronic Issues (6 mo), Hyperlipidemia, Hypothyroidism, and Gastroesophageal Reflux   1. Mixed hyperlipidemia Compliant with medications - Yes Current medications - Crestor, fenofibrate Side effects from medications - No Diet - generally healthy per pt report Exercise - regular exercise  Lab Results  Component Value Date   CHOL 236 (H) 03/05/2018   HDL 35 (L) 03/05/2018   LDLCALC Comment 03/05/2018   LDLDIRECT 72 05/13/2016   TRIG 893 (HH) 03/05/2018   CHOLHDL 6.7 (H) 03/05/2018     Family and personal medical history reviewed. Smoking and ETOH history reviewed.     2. Acquired hypothyroidism  Compliant with medications - Yes Current medications - Synthroid 100 mcg, Synthroid 125 mg, alternating Adverse side effects - No Bowel habit changes - No Heat or cold intolerance - No Mood changes - No Changes in sleep habits - No Fatigue - No Skin, hair, or nail changes - No Tremor - No Palpitations - No Edema - No Shortness of breath - No Lab Results  Component Value Date   TSH 3.620 04/07/2018     3. Vitamin D deficiency  Pt is taking oral repletion therapy. Denies bone pain and tenderness, muscle weakness, fracture, and difficulty walking. Lab Results  Component Value Date   VD25OH 28.7 (L) 03/05/2018   VD25OH 26.5 (L) 09/07/2017   VD25OH 35.7 01/27/2017   Lab Results  Component Value Date    CALCIUM 9.6 03/05/2018      4. Gastroesophageal reflux disease without esophagitis  Compliant with medications - Yes Current medications - omeprazole  Adverse side effects - No Cough - No Sore throat - No Voice change - No Hemoptysis - No Dysphagia or dyspepsia - No Water brash - No Red Flags (weight loss, hematochezia, melena, weight loss, early satiety, fevers, odynophagia, or persistent vomiting) - No   5. Coronary artery disease due to lipid rich plaque  No chest pain, shortness of breath, fatigue, PND, orthopnea, palpitations, leg swelling, dizziness, or syncope.    6. Trigger middle finger of right hand  Pt reports ongoing stiffness and locking of his right middle finger. Denies injury. States worse when he gets up in the morning and after golfing. Aching, dull pain. 5/10. Has not tried anything for the pain.      Relevant past medical, surgical, family, and social history reviewed and updated as indicated.  Allergies and medications reviewed and updated. Date reviewed: Chart in Epic.   Past Medical History:  Diagnosis Date   Angina at rest [facility])    Benign neoplasm of colon    CAD (coronary artery disease)     Occluded RCA, 50% Diat 2006, 2008   Diverticulosis of colon (without mention of hemorrhage)    Esophageal reflux    Other and unspecified hyperlipidemia    Unspecified disorder of thyroid     Past Surgical History:  Procedure Laterality Date   TONSILLECTOMY  1955    Social History  Socioeconomic History   Marital status: Widowed    Spouse name: Not on file   Number of children: 3   Years of education: Not on file   Highest education level: Not on file  Occupational History   Occupation: retired     Comment: golf / club prefessional   Social Needs   Financial resource strain: Not on file   Food insecurity    Worry: Not on file    Inability: Not on file   Transportation needs    Medical: Not on file    Non-medical: Not on  file  Tobacco Use   Smoking status: Former Smoker    Packs/day: 0.50    Types: Cigarettes    Quit date: 07/22/1986    Years since quitting: 32.1   Smokeless tobacco: Never Used  Substance and Sexual Activity   Alcohol use: Yes    Alcohol/week: 1.0 standard drinks    Types: 1 Cans of beer per week   Drug use: No   Sexual activity: Not on file  Lifestyle   Physical activity    Days per week: Not on file    Minutes per session: Not on file   Stress: Not on file  Relationships   Social connections    Talks on phone: Not on file    Gets together: Not on file    Attends religious service: Not on file    Active member of club or organization: Not on file    Attends meetings of clubs or organizations: Not on file    Relationship status: Not on file   Intimate partner violence    Fear of current or ex partner: Not on file    Emotionally abused: Not on file    Physically abused: Not on file    Forced sexual activity: Not on file  Other Topics Concern   Not on file  Social History Narrative   Not on file    Outpatient Encounter Medications as of 09/15/2018  Medication Sig   aspirin 81 MG EC tablet Take 81 mg by mouth daily.     Choline Fenofibrate (FENOFIBRIC ACID) 45 MG CPDR TAKE ONE (1) CAPSULE EACH DAY   fluticasone (FLONASE) 50 MCG/ACT nasal spray Place 2 sprays into both nostrils daily.   levothyroxine (SYNTHROID) 100 MCG tablet daily   levothyroxine (SYNTHROID) 25 MCG tablet Take 0.5 tab on Mon, Wed and Fri mornings before breakfast.   omeprazole (PRILOSEC) 20 MG capsule Take 2 caps po daily   ranolazine (RANEXA) 500 MG 12 hr tablet Take 1 tablet (500 mg total) by mouth 2 (two) times daily.   rosuvastatin (CRESTOR) 20 MG tablet Take 1 tablet (20 mg total) by mouth daily.   Vitamin D, Ergocalciferol, (DRISDOL) 1.25 MG (50000 UT) CAPS capsule TAKE 1 CAPSULE EVERY WEEK   [DISCONTINUED] Choline Fenofibrate (FENOFIBRIC ACID) 45 MG CPDR TAKE ONE (1) CAPSULE  EACH DAY   [DISCONTINUED] levothyroxine (SYNTHROID) 100 MCG tablet TAKE ONE (1) TABLET EACH DAY   [DISCONTINUED] levothyroxine (SYNTHROID, LEVOTHROID) 25 MCG tablet Take 0.5 tab on Mon, Wed and Fri mornings before breakfast.   [DISCONTINUED] omeprazole (PRILOSEC) 20 MG capsule Take 2 caps po daily   [DISCONTINUED] ranolazine (RANEXA) 500 MG 12 hr tablet Take 1 tablet (500 mg total) by mouth 2 (two) times daily.   [DISCONTINUED] rosuvastatin (CRESTOR) 20 MG tablet Take 1 tablet by mouth daily.   [DISCONTINUED] Vitamin D, Ergocalciferol, (DRISDOL) 50000 units CAPS capsule TAKE 1 CAPSULE EVERY WEEK  diclofenac sodium (VOLTAREN) 1 % GEL Apply 2 g topically 4 (four) times daily for 14 days.   nitroGLYCERIN (NITROSTAT) 0.4 MG SL tablet Place 0.4 mg under the tongue every 5 (five) minutes as needed. Reported on 06/05/2015   predniSONE (DELTASONE) 20 MG tablet 2 po at sametime daily for 5 days   [DISCONTINUED] ezetimibe-simvastatin (VYTORIN) 10-20 MG tablet Take 1 tablet by mouth daily.   [DISCONTINUED] sulfamethoxazole-trimethoprim (BACTRIM DS,SEPTRA DS) 800-160 MG tablet Take 1 tablet by mouth 2 (two) times daily.   [DISCONTINUED] SUPREP BOWEL PREP KIT 17.5-3.13-1.6 GM/177ML SOLN Suprep-Use as directed (Patient not taking: Reported on 03/05/2018)   No facility-administered encounter medications on file as of 09/15/2018.     Allergies  Allergen Reactions   Imdur [Isosorbide Nitrate]     Headache    Lipitor [Atorvastatin] Other (See Comments)    myalgias    Review of Systems  Constitutional: Negative for activity change, appetite change, chills, diaphoresis, fatigue, fever and unexpected weight change.  Eyes: Negative for photophobia and visual disturbance.  Respiratory: Negative for cough, chest tightness and shortness of breath.   Cardiovascular: Negative for chest pain, palpitations and leg swelling.  Gastrointestinal: Negative for abdominal pain, anal bleeding, blood in stool,  constipation, diarrhea, nausea, rectal pain and vomiting.  Endocrine: Negative for cold intolerance, heat intolerance, polydipsia, polyphagia and polyuria.  Genitourinary: Negative for decreased urine volume, difficulty urinating, dysuria, enuresis, flank pain, hematuria and urgency.  Musculoskeletal: Positive for arthralgias. Negative for gait problem and myalgias.  Skin: Negative for color change and pallor.  Neurological: Negative for dizziness, tremors, seizures, syncope, facial asymmetry, speech difficulty, weakness, light-headedness, numbness and headaches.  Psychiatric/Behavioral: Negative for confusion.  All other systems reviewed and are negative.       Objective:  BP 129/74    Pulse (!) 57    Temp 99.6 F (37.6 C)    Ht 6' 1.75" (1.873 m)    Wt 217 lb (98.4 kg)    BMI 28.05 kg/m    Wt Readings from Last 3 Encounters:  09/15/18 217 lb (98.4 kg)  03/05/18 213 lb (96.6 kg)  03/01/18 216 lb (98 kg)    Physical Exam Vitals signs and nursing note reviewed.  Constitutional:      General: He is not in acute distress.    Appearance: Normal appearance. He is well-developed and well-groomed. He is not ill-appearing, toxic-appearing or diaphoretic.  HENT:     Head: Normocephalic and atraumatic.     Jaw: There is normal jaw occlusion.     Right Ear: Hearing normal.     Left Ear: Hearing normal.     Nose: Nose normal.     Mouth/Throat:     Lips: Pink.     Mouth: Mucous membranes are moist.     Pharynx: Oropharynx is clear. Uvula midline.  Eyes:     General: Lids are normal.     Extraocular Movements: Extraocular movements intact.     Conjunctiva/sclera: Conjunctivae normal.     Pupils: Pupils are equal, round, and reactive to light.  Neck:     Musculoskeletal: Normal range of motion and neck supple.     Thyroid: No thyroid mass, thyromegaly or thyroid tenderness.     Vascular: No carotid bruit or JVD.     Trachea: Trachea and phonation normal.  Cardiovascular:     Rate  and Rhythm: Normal rate and regular rhythm.     Chest Wall: PMI is not displaced.     Pulses: Normal  pulses.     Heart sounds: Normal heart sounds. No murmur. No friction rub. No gallop.   Pulmonary:     Effort: Pulmonary effort is normal. No respiratory distress.     Breath sounds: Normal breath sounds. No wheezing.  Abdominal:     General: Bowel sounds are normal. There is no distension or abdominal bruit.     Palpations: Abdomen is soft. There is no hepatomegaly or splenomegaly.     Tenderness: There is no abdominal tenderness. There is no right CVA tenderness or left CVA tenderness.     Hernia: No hernia is present.  Musculoskeletal:     Right forearm: Normal.     Right hand: He exhibits decreased range of motion and tenderness. He exhibits no bony tenderness, normal two-point discrimination, normal capillary refill, no deformity, no laceration and no swelling. Normal sensation noted. Normal strength noted.       Hands:     Right lower leg: No edema.     Left lower leg: No edema.  Lymphadenopathy:     Cervical: No cervical adenopathy.  Skin:    General: Skin is warm and dry.     Capillary Refill: Capillary refill takes less than 2 seconds.     Coloration: Skin is not cyanotic, jaundiced or pale.     Findings: No rash.  Neurological:     General: No focal deficit present.     Mental Status: He is alert and oriented to person, place, and time.     Cranial Nerves: Cranial nerves are intact.     Sensory: Sensation is intact.     Motor: Motor function is intact.     Coordination: Coordination is intact.     Gait: Gait is intact.     Deep Tendon Reflexes: Reflexes are normal and symmetric.  Psychiatric:        Attention and Perception: Attention and perception normal.        Mood and Affect: Mood and affect normal.        Speech: Speech normal.        Behavior: Behavior normal. Behavior is cooperative.        Thought Content: Thought content normal.        Cognition and Memory:  Cognition and memory normal.        Judgment: Judgment normal.     Results for orders placed or performed in visit on 04/07/18  TSH  Result Value Ref Range   TSH 3.620 0.450 - 4.500 uIU/mL       Pertinent labs & imaging results that were available during my care of the patient were reviewed by me and considered in my medical decision making.  Assessment & Plan:  Johne was seen today for medical management of chronic issues, hyperlipidemia, hypothyroidism and gastroesophageal reflux.  Diagnoses and all orders for this visit:  Mixed hyperlipidemia Ongoing, uncontrolled hyperlipidemia. Importance of aggressive lifestyle changes discussed. Will consider regimen change if levels remain elevated.  -     Choline Fenofibrate (FENOFIBRIC ACID) 45 MG CPDR; TAKE ONE (1) CAPSULE EACH DAY -     rosuvastatin (CRESTOR) 20 MG tablet; Take 1 tablet (20 mg total) by mouth daily. -     CMP14+EGFR -     CBC with Differential/Platelet -     Lipid panel  Acquired hypothyroidism Doing well on current regimen. Labs pending. Will change repletion regimen if warranted.  -     levothyroxine (SYNTHROID) 100 MCG tablet; daily -  levothyroxine (SYNTHROID) 25 MCG tablet; Take 0.5 tab on Mon, Wed and Fri mornings before breakfast. -     CBC with Differential/Platelet -     Thyroid Panel With TSH  Vitamin D deficiency Labs pending. Will change repletion therapy if warranted.  -     Vitamin D, Ergocalciferol, (DRISDOL) 1.25 MG (50000 UT) CAPS capsule; TAKE 1 CAPSULE EVERY WEEK -     CBC with Differential/Platelet -     VITAMIN D 25 Hydroxy (Vit-D Deficiency, Fractures)  Gastroesophageal reflux disease without esophagitis Well controlled with current therapy. No red flags present. Will check CBC. Continue medications as prescribed.  -     omeprazole (PRILOSEC) 20 MG capsule; Take 2 caps po daily -     CBC with Differential/Platelet  Coronary artery disease due to lipid rich plaque Doing well, no chest  pain, shortness of breath, palpitations, or syncope. Labs pending. Continue medications as prescribed.  -     ranolazine (RANEXA) 500 MG 12 hr tablet; Take 1 tablet (500 mg total) by mouth 2 (two) times daily. -     CMP14+EGFR -     CBC with Differential/Platelet  Screening for colorectal cancer Pt due for colonoscopy, previously cancelled due to COVID-19 -     Ambulatory referral to Gastroenterology  Trigger middle finger of right hand Pt offered and declined trigger point injection today. Pt opted for oral steroids and diclofenac gel. Pt aware to report any new, persistent, or worsening symptoms. Medications as prescribed.  -     predniSONE (DELTASONE) 20 MG tablet; 2 po at sametime daily for 5 days -     diclofenac sodium (VOLTAREN) 1 % GEL; Apply 2 g topically 4 (four) times daily for 14 days.     Continue all other maintenance medications.  Follow up plan: Return in about 3 months (around 12/16/2018), or if symptoms worsen or fail to improve, for lipids, trigger finger.  Continue healthy lifestyle choices, including diet (rich in fruits, vegetables, and lean proteins, and low in salt and simple carbohydrates) and exercise (at least 30 minutes of moderate physical activity daily).  Educational handout given for survey, trigger finger, COVID-19  The above assessment and management plan was discussed with the patient. The patient verbalized understanding of and has agreed to the management plan. Patient is aware to call the clinic if symptoms persist or worsen. Patient is aware when to return to the clinic for a follow-up visit. Patient educated on when it is appropriate to go to the emergency department.   Monia Pouch, FNP-C [facility] [phone] 09/15/18

## 2018-09-15 NOTE — Patient Instructions (Addendum)
It was a pleasure seeing you today, [name].  Information regarding what we discussed is included in this packet.  Please make an appointment to see me in 3 months.   In a few days you may receive a survey in the mail or online from [employer] regarding your visit with [place] today. Please take a moment to fill this out. Your feedback is very important to our office. It can help [place] better understand your needs as well as improve your experience and satisfaction. Thank you for taking your time to complete it. We care about you.   Trigger Finger  Trigger finger (stenosing tenosynovitis) is a condition that causes a finger to get stuck in a bent position. Each finger has a tough, cord-like tissue that connects muscle to bone (tendon), and each tendon is surrounded by a tunnel of tissue (tendon sheath). To move your finger, your tendon needs to slide freely through the sheath. Trigger finger happens when the tendon or the sheath thickens, making it difficult to move your finger. Trigger finger can affect any finger or a thumb. It may affect more than one finger. Mild cases may clear up with rest and medicine. Severe cases require more treatment. What are the causes? Trigger finger is caused by a thickened finger tendon or tendon sheath. The cause of this thickening is not known. What increases the risk? The following factors may make you more likely to develop this condition:  Doing activities that require a strong grip.  Having rheumatoid arthritis, gout, or diabetes.  Being 70-57 years old.  Being a woman. What are the signs or symptoms? Symptoms of this condition include:  Pain when bending or straightening your finger.  Tenderness or swelling where your finger attaches to the palm of your hand.  A lump in the palm of your hand or on the inside of your finger.  Hearing a popping sound when you try to straighten your finger.  Feeling a popping, catching, or locking sensation when you try to  straighten your finger.  Being unable to straighten your finger. How is this diagnosed? This condition is diagnosed based on your symptoms and a physical exam. How is this treated? This condition may be treated by:  Resting your finger and avoiding activities that make symptoms worse.  Wearing a finger splint to keep your finger in a slightly bent position.  Taking NSAIDs to relieve pain and swelling.  Injecting medicine (steroids) into the tendon sheath to reduce swelling and irritation. Injections may need to be repeated.  Having surgery to open the tendon sheath. This may be done if other treatments do not work and you cannot straighten your finger. You may need physical therapy after surgery. Follow these instructions at home:   Use moist heat to help reduce pain and swelling as told by your health care provider.  Rest your finger and avoid activities that make pain worse. Return to normal activities as told by your health care provider.  If you have a splint, wear it as told by your health care provider.  Take over-the-counter and prescription medicines only as told by your health care provider.  Keep all follow-up visits as told by your health care provider. This is important. Contact a health care provider if:  Your symptoms are not improving with home care. Summary  Trigger finger (stenosing tenosynovitis) causes your finger to get stuck in a bent position, and it can make it difficult and painful to straighten your finger.  This condition  develops when a finger tendon or tendon sheath thickens.  Treatment starts with resting, wearing a splint, and taking NSAIDs.  In severe cases, surgery to open the tendon sheath may be needed. This information is not intended to replace advice given to you by your health care provider. Make sure you discuss any questions you have with your health care provider. Document Released: 11/17/2003 Document Revised: 01/09/2017 Document  Reviewed: 01/08/2016 Elsevier Patient Education  [employer].   Because of recent events of COVID-19 ("Coronavirus"), please follow CDC recommendations:   1. Wash your hand frequently 2. Avoid touching your face 3. Stay away from people who are sick 4. If you have symptoms such as fever, cough, shortness of breath then call your healthcare provider for further guidance 5. If you are sick, STAY AT HOME, unless otherwise directed by your healthcare provider. 6. Follow directions from state and national officials regarding staying safe    Please feel free to call our office if any questions or concerns arise.  Warm Regards, Monia Pouch, FNP-C [facility] [address] [place], [place] [address] [phone]

## 2018-09-16 LAB — THYROID PANEL WITH TSH
Free Thyroxine Index: 2.6 (ref 1.2–4.9)
T3 Uptake Ratio: 29 % (ref 24–39)
T4, Total: 8.9 ug/dL (ref 4.5–12.0)
TSH: 3.38 u[IU]/mL (ref 0.450–4.500)

## 2018-09-16 LAB — CBC WITH DIFFERENTIAL/PLATELET
Basophils Absolute: 0 10*3/uL (ref 0.0–0.2)
Basos: 1 %
EOS (ABSOLUTE): 0.1 10*3/uL (ref 0.0–0.4)
Eos: 3 %
Hematocrit: 39.5 % (ref 37.5–51.0)
Hemoglobin: 13 g/dL (ref 13.0–17.7)
Immature Grans (Abs): 0 10*3/uL (ref 0.0–0.1)
Immature Granulocytes: 0 %
Lymphocytes Absolute: 1.7 10*3/uL (ref 0.7–3.1)
Lymphs: 35 %
MCH: 29.6 pg (ref 26.6–33.0)
MCHC: 32.9 g/dL (ref 31.5–35.7)
MCV: 90 fL (ref 79–97)
Monocytes Absolute: 0.4 10*3/uL (ref 0.1–0.9)
Monocytes: 9 %
Neutrophils Absolute: 2.5 10*3/uL (ref 1.4–7.0)
Neutrophils: 52 %
Platelets: 258 10*3/uL (ref 150–450)
RBC: 4.39 x10E6/uL (ref 4.14–5.80)
RDW: 14.2 % (ref 11.6–15.4)
WBC: 4.8 10*3/uL (ref 3.4–10.8)

## 2018-09-16 LAB — CMP14+EGFR
ALT: 48 IU/L — ABNORMAL HIGH (ref 0–44)
AST: 36 IU/L (ref 0–40)
Albumin/Globulin Ratio: 1.6 (ref 1.2–2.2)
Albumin: 4.6 g/dL (ref 3.8–4.8)
Alkaline Phosphatase: 72 IU/L (ref 39–117)
BUN/Creatinine Ratio: 10 (ref 10–24)
BUN: 13 mg/dL (ref 8–27)
Bilirubin Total: 0.4 mg/dL (ref 0.0–1.2)
CO2: 24 mmol/L (ref 20–29)
Calcium: 9.3 mg/dL (ref 8.6–10.2)
Chloride: 100 mmol/L (ref 96–106)
Creatinine, Ser: 1.3 mg/dL — ABNORMAL HIGH (ref 0.76–1.27)
GFR calc Af Amer: 64 mL/min/{1.73_m2} (ref >59)
GFR calc non Af Amer: 56 mL/min/{1.73_m2} — ABNORMAL LOW (ref >59)
Globulin, Total: 2.8 g/dL (ref 1.5–4.5)
Glucose: 104 mg/dL — ABNORMAL HIGH (ref 65–99)
Potassium: 4.4 mmol/L (ref 3.5–5.2)
Sodium: 140 mmol/L (ref 134–144)
Total Protein: 7.4 g/dL (ref 6.0–8.5)

## 2018-09-16 LAB — VITAMIN D 25 HYDROXY (VIT D DEFICIENCY, FRACTURES): Vit D, 25-Hydroxy: 49.1 ng/mL (ref 30.0–100.0)

## 2018-09-16 LAB — LIPID PANEL
Chol/HDL Ratio: 6.1 ratio — ABNORMAL HIGH (ref 0.0–5.0)
Cholesterol, Total: 201 mg/dL — ABNORMAL HIGH (ref 100–199)
HDL: 33 mg/dL — ABNORMAL LOW (ref >39)
Triglycerides: 449 mg/dL — ABNORMAL HIGH (ref 0–149)

## 2018-09-20 ENCOUNTER — Telehealth: Payer: Self-pay | Admitting: Family Medicine

## 2018-09-20 NOTE — Telephone Encounter (Signed)
Patient states that he would like to have cholesterol medication changed to vytorin

## 2018-09-20 NOTE — Telephone Encounter (Signed)
I want to continue on current meds due to improvement in numbers. We will change if next check remains elevated.

## 2018-09-20 NOTE — Telephone Encounter (Signed)
Patient aware.

## 2018-10-01 ENCOUNTER — Other Ambulatory Visit: Payer: Self-pay | Admitting: *Deleted

## 2018-10-01 DIAGNOSIS — N289 Disorder of kidney and ureter, unspecified: Secondary | ICD-10-CM

## 2018-10-01 DIAGNOSIS — R7989 Other specified abnormal findings of blood chemistry: Secondary | ICD-10-CM

## 2018-10-01 DIAGNOSIS — R899 Unspecified abnormal finding in specimens from other organs, systems and tissues: Secondary | ICD-10-CM

## 2018-10-04 ENCOUNTER — Ambulatory Visit (INDEPENDENT_AMBULATORY_CARE_PROVIDER_SITE_OTHER): Payer: Medicare HMO | Admitting: *Deleted

## 2018-10-04 DIAGNOSIS — Z0000 Encounter for general adult medical examination without abnormal findings: Secondary | ICD-10-CM | POA: Diagnosis not present

## 2018-10-04 NOTE — Progress Notes (Signed)
MEDICARE ANNUAL WELLNESS VISIT  10/04/2018  Telephone Visit Disclaimer This Medicare AWV was conducted by telephone due to national recommendations for restrictions regarding the COVID-19 Pandemic (e.g. social distancing).  I verified, using two identifiers, that I am speaking with Corey Ochs. or their authorized healthcare agent. I discussed the limitations, risks, security, and privacy concerns of performing an evaluation and management service by telephone and the potential availability of an in-person appointment in the future. The patient expressed understanding and agreed to proceed.   Subjective:  [name]. is a 68 y.o. male patient of Rakes, Connye Burkitt, FNP who had a Medicare Annual Wellness Visit today via telephone. [name] is Retired and lives alone. he has 3 children. he reports that he is socially active and does interact with friends/family regularly. 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  Patient Care Team: Baruch Gouty, FNP as PCP - General (Family Medicine) Pyrtle, Lajuan Lines, MD as Consulting Physician (Gastroenterology) Laurin Coder, MD as Consulting Physician (Pulmonary Disease) Minus Breeding, MD as Consulting Physician (Cardiology)  Advanced Directives 10/04/2018 09/28/2017  Does Patient Have a Medical Advance Directive? No No  Would patient like information on creating a medical advance directive? No - Patient declined Yes (MAU/Ambulatory/Procedural Areas - Information given)    Hospital Utilization Over the Past 12 Months: # of hospitalizations or ER visits: 0 # of surgeries: 0  Review of Systems    Patient reports that his overall health is unchanged compared to last year.  Patient Reported Readings (BP, Pulse, CBG, Weight, etc) none  Review of Systems: History obtained from chart review  All other systems negative.  Pain Assessment Pain : No/denies  pain     Current Medications & Allergies (verified) Allergies as of 10/04/2018      Reactions   Imdur [isosorbide Nitrate]    Headache   Lipitor [atorvastatin] Other (See Comments)   myalgias      Medication List       Accurate as of October 04, 2018  2:47 PM. If you have any questions, ask your nurse or doctor.        aspirin 81 MG EC tablet Take 81 mg by mouth daily.   Fenofibric Acid 45 MG Cpdr TAKE ONE (1) CAPSULE EACH DAY   fluticasone 50 MCG/ACT nasal spray Commonly known as: FLONASE Place 2 sprays into both nostrils daily.   levothyroxine 100 MCG tablet Commonly known as: SYNTHROID daily   levothyroxine 25 MCG tablet Commonly known as: SYNTHROID Take 0.5 tab on Mon, Wed and Fri mornings before breakfast.   nitroGLYCERIN 0.4 MG SL tablet Commonly known as: NITROSTAT Place 0.4 mg under the tongue every 5 (five) minutes as needed. Reported on 06/05/2015   omeprazole 20 MG capsule Commonly known as: PRILOSEC Take 2 caps po daily   predniSONE 20 MG tablet Commonly known as: Deltasone 2 po at sametime daily for 5 days   ranolazine 500 MG 12 hr tablet Commonly known as: Ranexa Take 1 tablet (500 mg total) by mouth 2 (two) times daily.   rosuvastatin 20 MG tablet Commonly known as: CRESTOR Take 1 tablet (20 mg total) by mouth daily.   Vitamin D (Ergocalciferol) 1.25 MG (50000 UT) Caps capsule Commonly known as: DRISDOL TAKE 1 CAPSULE EVERY WEEK       History (reviewed): Past Medical History:  Diagnosis Date  . Angina at rest [facility])   .  Benign neoplasm of colon   . CAD (coronary artery disease)     Occluded RCA, 50% Diat 2006, 2008  . Diverticulosis of colon (without mention of hemorrhage)   . Esophageal reflux   . Other and unspecified hyperlipidemia   . Unspecified disorder of thyroid    Past Surgical History:  Procedure Laterality Date  . TONSILLECTOMY  1955   Family History  Problem Relation Age of Onset  . Hyperlipidemia Father   .  Heart disease Father        pacemaker / CHF   . Dementia Maternal Grandmother   . Cancer Maternal Grandfather        throat  . Alzheimer's disease Paternal Grandmother   . Cancer Paternal Grandfather        lung  . Cancer Sister        breast  . Colon cancer Neg Hx    Social History   Socioeconomic History  . Marital status: Widowed    Spouse name: Not on file  . Number of children: 3  . Years of education: 16  . Highest education level: Bachelor's degree (e.g., BA, AB, BS)  Occupational History  . Occupation: retired     Comment: golf / club prefessional   Social Needs  . Financial resource strain: Not hard at all  . Food insecurity    Worry: Never true    Inability: Never true  . Transportation needs    Medical: No    Non-medical: No  Tobacco Use  . Smoking status: Former Smoker    Packs/day: 0.50    Types: Cigarettes    Quit date: 07/22/1986    Years since quitting: 32.2  . Smokeless tobacco: Never Used  Substance and Sexual Activity  . Alcohol use: Not Currently  . Drug use: No  . Sexual activity: Not Currently  Lifestyle  . Physical activity    Days per week: 7 days    Minutes per session: 30 min  . Stress: Not at all  Relationships  . Social connections    Talks on phone: More than three times a week    Gets together: More than three times a week    Attends religious service: More than 4 times per year    Active member of club or organization: Yes    Attends meetings of clubs or organizations: More than 4 times per year    Relationship status: Widowed  Other Topics Concern  . Not on file  Social History Narrative  . Not on file    Activities of Daily Living In your present state of health, do you have any difficulty performing the following activities: 10/04/2018  Hearing? N  Vision? N  Difficulty concentrating or making decisions? N  Walking or climbing stairs? N  Dressing or bathing? N  Doing errands, shopping? N  Preparing Food and eating ?  N  Using the Toilet? N  In the past six months, have you accidently leaked urine? N  Do you have problems with loss of bowel control? N  Managing your Medications? N  Managing your Finances? N  Housekeeping or managing your Housekeeping? N  Some recent data might be hidden    Patient Education/ Literacy How often do you need to have someone help you when you read instructions, pamphlets, or other written materials from your doctor or pharmacy?: 1 - Never What is the last grade level you completed in school?: Bachelors Degree  Exercise Current Exercise Habits: Home exercise routine,  Type of exercise: walking, Time (Minutes): 30, Frequency (Times/Week): 7, Weekly Exercise (Minutes/Week): 210, Intensity: Moderate  Diet Patient reports consuming 3 meals a day and 3 snack(s) a day Patient reports that his primary diet is: Regular Patient reports that she does have regular access to food.   Depression Screen PHQ 2/9 Scores 10/04/2018 09/15/2018 03/05/2018 03/01/2018 09/28/2017 09/02/2017 07/24/2017  PHQ - 2 Score 0 0 0 0 0 0 0     Fall Risk Fall Risk  10/04/2018 09/15/2018 03/05/2018 03/01/2018 09/28/2017  Falls in the past year? 0 0 0 0 No  Number falls in past yr: 0 - - - -  Injury with Fall? 0 - - - -     Objective:  Corey Ochs. seemed alert and oriented and he participated appropriately during our telephone visit.  Blood Pressure Weight BMI  BP Readings from Last 3 Encounters:  09/15/18 129/74  04/07/18 125/68  03/05/18 127/73   Wt Readings from Last 3 Encounters:  09/15/18 217 lb (98.4 kg)  03/05/18 213 lb (96.6 kg)  03/01/18 216 lb (98 kg)   BMI Readings from Last 1 Encounters:  09/15/18 28.05 kg/m    *Unable to obtain current vital signs, weight, and BMI due to telephone visit type  Hearing/Vision  . Sasan did not seem to have difficulty with hearing/understanding during the telephone conversation . Reports that he has had a formal eye exam by an eye care  professional within the past year . Reports that he has not had a formal hearing evaluation within the past year *Unable to fully assess hearing and vision during telephone visit type  Cognitive Function: 6CIT Screen 10/04/2018  What Year? 0 points  What month? 0 points  What time? 0 points  Count back from 20 0 points  Months in reverse 0 points  Repeat phrase 0 points  Total Score 0   (Normal:0-7, Significant for Dysfunction: >8)  Normal Cognitive Function Screening: Yes   Immunization & Health Maintenance Record Immunization History  Administered Date(s) Administered  . Tdap 12/08/2007    Health Maintenance  Topic Date Due  . COLONOSCOPY  02/15/2017  . INFLUENZA VACCINE  09/11/2018  . Hepatitis C Screening  03/06/2019 (Originally Jun 03, 1950)  . TETANUS/TDAP  09/15/2019 (Originally 12/07/2017)  . PNA vac Low Risk Adult (1 of 2 - PCV13) 07/28/2021 (Originally 01/10/2014)  . COLON CANCER SCREENING ANNUAL FOBT  Discontinued       Assessment  This is a routine wellness examination for [employer].Marland Kitchen  Health Maintenance: Due or Overdue Health Maintenance Due  Topic Date Due  . COLONOSCOPY  02/15/2017  . INFLUENZA VACCINE  09/11/2018    Corey Ochs. does not need a referral for Community Assistance: Care Management:   no Social Work:    no Prescription Assistance:  no Nutrition/Diabetes Education:  no   Plan:  Personalized Goals Goals Addressed            This Visit's Progress   . DIET - INCREASE WATER INTAKE       Try to drink 6-8 glasses of water daily.      Personalized Health Maintenance & Screening Recommendations  Pneumococcal vaccine  Influenza vaccine Td vaccine Colorectal cancer screening Shingles vaccine  Lung Cancer Screening Recommended: no (Low Dose CT Chest recommended if Age 67-80 years, 30 pack-year currently smoking OR have quit w/in past 15 years) Hepatitis C Screening recommended: no HIV Screening recommended: no   Advanced Directives: Written information was  not prepared per patient's request.  Referrals & Orders No orders of the defined types were placed in this encounter.   Follow-up Plan . Follow-up with Baruch Gouty, FNP as planned . Schedule your Colonoscopy as discussed . Consider Prevnar13, Flu, TDAP and Shingles vaccines at your next visit with your PCP   I have personally reviewed and noted the following in the patient's chart:   . Medical and social history . Use of alcohol, tobacco or illicit drugs  . Current medications and supplements . Functional ability and status . Nutritional status . Physical activity . Advanced directives . List of other physicians . Hospitalizations, surgeries, and ER visits in previous 12 months . Vitals . Screenings to include cognitive, depression, and falls . Referrals and appointments  In addition, I have reviewed and discussed with Corey Ochs. certain preventive protocols, quality metrics, and best practice recommendations. A written personalized care plan for preventive services as well as general preventive health recommendations is available and can be mailed to the patient at his request.      Marylin Crosby, LPN  [id number]

## 2018-10-04 NOTE — Patient Instructions (Signed)
Preventive Care 70 Years and Older, Male Preventive care refers to lifestyle choices and visits with your health care provider that can promote health and wellness. This includes:  A yearly physical exam. This is also called an annual well check.  Regular dental and eye exams.  Immunizations.  Screening for certain conditions.  Healthy lifestyle choices, such as diet and exercise. What can I expect for my preventive care visit? Physical exam Your health care provider will check:  Height and weight. These may be used to calculate body mass index (BMI), which is a measurement that tells if you are at a healthy weight.  Heart rate and blood pressure.  Your skin for abnormal spots. Counseling Your health care provider may ask you questions about:  Alcohol, tobacco, and drug use.  Emotional well-being.  Home and relationship well-being.  Sexual activity.  Eating habits.  History of falls.  Memory and ability to understand (cognition).  Work and work Statistician. What immunizations do I need?  Influenza (flu) vaccine  This is recommended every year. Tetanus, diphtheria, and pertussis (Tdap) vaccine  You may need a Td booster every 10 years. Varicella (chickenpox) vaccine  You may need this vaccine if you have not already been vaccinated. Zoster (shingles) vaccine  You may need this after age 50. Pneumococcal conjugate (PCV13) vaccine  One dose is recommended after age 24. Pneumococcal polysaccharide (PPSV23) vaccine  One dose is recommended after age 33. Measles, mumps, and rubella (MMR) vaccine  You may need at least one dose of MMR if you were born in 1957 or later. You may also need a second dose. Meningococcal conjugate (MenACWY) vaccine  You may need this if you have certain conditions. Hepatitis A vaccine  You may need this if you have certain conditions or if you travel or work in places where you may be exposed to hepatitis A. Hepatitis B vaccine   You may need this if you have certain conditions or if you travel or work in places where you may be exposed to hepatitis B. Haemophilus influenzae type b (Hib) vaccine  You may need this if you have certain conditions. You may receive vaccines as individual doses or as more than one vaccine together in one shot (combination vaccines). Talk with your health care provider about the risks and benefits of combination vaccines. What tests do I need? Blood tests  Lipid and cholesterol levels. These may be checked every 5 years, or more frequently depending on your overall health.  Hepatitis C test.  Hepatitis B test. Screening  Lung cancer screening. You may have this screening every year starting at age 74 if you have a 30-pack-year history of smoking and currently smoke or have quit within the past 15 years.  Colorectal cancer screening. All adults should have this screening starting at age 57 and continuing until age 54. Your health care provider may recommend screening at age 47 if you are at increased risk. You will have tests every 1-10 years, depending on your results and the type of screening test.  Prostate cancer screening. Recommendations will vary depending on your family history and other risks.  Diabetes screening. This is done by checking your blood sugar (glucose) after you have not eaten for a while (fasting). You may have this done every 1-3 years.  Abdominal aortic aneurysm (AAA) screening. You may need this if you are a current or former smoker.  Sexually transmitted disease (STD) testing. Follow these instructions at home: Eating and drinking  Eat  a diet that includes fresh fruits and vegetables, whole grains, lean protein, and low-fat dairy products. Limit your intake of foods with high amounts of sugar, saturated fats, and salt.  Take vitamin and mineral supplements as recommended by your health care provider.  Do not drink alcohol if your health care provider  tells you not to drink.  If you drink alcohol: ? Limit how much you have to 0-2 drinks a day. ? Be aware of how much alcohol is in your drink. In the [place] one drink equals one 12 oz bottle of beer (355 mL), one 5 oz glass of wine (148 mL), or one 1 oz glass of hard liquor (44 mL). Lifestyle  Take daily care of your teeth and gums.  Stay active. Exercise for at least 30 minutes on 5 or more days each week.  Do not use any products that contain nicotine or tobacco, such as cigarettes, e-cigarettes, and chewing tobacco. If you need help quitting, ask your health care provider.  If you are sexually active, practice safe sex. Use a condom or other form of protection to prevent STIs (sexually transmitted infections).  Talk with your health care provider about taking a low-dose aspirin or statin. What's next?  Visit your health care provider once a year for a well check visit.  Ask your health care provider how often you should have your eyes and teeth checked.  Stay up to date on all vaccines. This information is not intended to replace advice given to you by your health care provider. Make sure you discuss any questions you have with your health care provider. Document Released: 02/23/2015 Document Revised: 01/21/2018 Document Reviewed: 01/21/2018 Elsevier Patient Education  2020 Elsevier Inc.  

## 2018-10-14 ENCOUNTER — Other Ambulatory Visit: Payer: Self-pay | Admitting: Family Medicine

## 2018-10-14 DIAGNOSIS — R067 Sneezing: Secondary | ICD-10-CM

## 2018-11-15 ENCOUNTER — Encounter: Payer: Self-pay | Admitting: Family Medicine

## 2018-12-15 ENCOUNTER — Ambulatory Visit: Payer: Medicare HMO | Admitting: Cardiology

## 2018-12-15 ENCOUNTER — Ambulatory Visit: Payer: Medicare HMO | Admitting: Family Medicine

## 2018-12-16 ENCOUNTER — Ambulatory Visit: Payer: Medicare HMO | Admitting: Family Medicine

## 2018-12-21 ENCOUNTER — Other Ambulatory Visit: Payer: Self-pay

## 2018-12-22 ENCOUNTER — Ambulatory Visit (INDEPENDENT_AMBULATORY_CARE_PROVIDER_SITE_OTHER): Payer: Medicare HMO | Admitting: Family Medicine

## 2018-12-22 ENCOUNTER — Encounter: Payer: Self-pay | Admitting: Family Medicine

## 2018-12-22 VITALS — BP 126/76 | HR 63 | Temp 98.9°F | Resp 20 | Ht 73.75 in | Wt 213.0 lb

## 2018-12-22 DIAGNOSIS — N289 Disorder of kidney and ureter, unspecified: Secondary | ICD-10-CM | POA: Insufficient documentation

## 2018-12-22 DIAGNOSIS — E782 Mixed hyperlipidemia: Secondary | ICD-10-CM | POA: Diagnosis not present

## 2018-12-22 DIAGNOSIS — I25111 Atherosclerotic heart disease of native coronary artery with angina pectoris with documented spasm: Secondary | ICD-10-CM | POA: Diagnosis not present

## 2018-12-22 DIAGNOSIS — E039 Hypothyroidism, unspecified: Secondary | ICD-10-CM

## 2018-12-22 MED ORDER — EZETIMIBE-SIMVASTATIN 10-20 MG PO TABS: 1.0000 | ORAL_TABLET | Freq: Every day | ORAL | 6 refills | Status: DC

## 2018-12-22 NOTE — Patient Instructions (Addendum)
Ask cardiology about Repatha   High Cholesterol  High cholesterol is a condition in which the blood has high levels of a white, waxy, fat-like substance (cholesterol). The human body needs small amounts of cholesterol. The liver makes all the cholesterol that the body needs. Extra (excess) cholesterol comes from the food that we eat. Cholesterol is carried from the liver by the blood through the blood vessels. If you have high cholesterol, deposits (plaques) may build up on the walls of your blood vessels (arteries). Plaques make the arteries narrower and stiffer. Cholesterol plaques increase your risk for heart attack and stroke. Work with your health care provider to keep your cholesterol levels in a healthy range. What increases the risk? This condition is more likely to develop in people who:  Eat foods that are high in animal fat (saturated fat) or cholesterol.  Are overweight.  Are not getting enough exercise.  Have a family history of high cholesterol. What are the signs or symptoms? There are no symptoms of this condition. How is this diagnosed? This condition may be diagnosed from the results of a blood test.  If you are older than age 56, your health care provider may check your cholesterol every 4-6 years.  You may be checked more often if you already have high cholesterol or other risk factors for heart disease. The blood test for cholesterol measures:  "Bad" cholesterol (LDL cholesterol). This is the main type of cholesterol that causes heart disease. The desired level for LDL is less than 100.  "Good" cholesterol (HDL cholesterol). This type helps to protect against heart disease by cleaning the arteries and carrying the LDL away. The desired level for HDL is 60 or higher.  Triglycerides. These are fats that the body can store or burn for energy. The desired number for triglycerides is lower than 150.  Total cholesterol. This is a measure of the total amount of  cholesterol in your blood, including LDL cholesterol, HDL cholesterol, and triglycerides. A healthy number is less than 200. How is this treated? This condition is treated with diet changes, lifestyle changes, and medicines. Diet changes  This may include eating more whole grains, fruits, vegetables, nuts, and fish.  This may also include cutting back on red meat and foods that have a lot of added sugar. Lifestyle changes  Changes may include getting at least 40 minutes of aerobic exercise 3 times a week. Aerobic exercises include walking, biking, and swimming. Aerobic exercise along with a healthy diet can help you maintain a healthy weight.  Changes may also include quitting smoking. Medicines  Medicines are usually given if diet and lifestyle changes have failed to reduce your cholesterol to healthy levels.  Your health care provider may prescribe a statin medicine. Statin medicines have been shown to reduce cholesterol, which can reduce the risk of heart disease. Follow these instructions at home: Eating and drinking If told by your health care provider:  Eat chicken (without skin), fish, veal, shellfish, ground [place] breast, and round or loin cuts of red meat.  Do not eat fried foods or fatty meats, such as hot dogs and salami.  Eat plenty of fruits, such as apples.  Eat plenty of vegetables, such as broccoli, potatoes, and carrots.  Eat beans, peas, and lentils.  Eat grains such as barley, rice, couscous, and bulgur wheat.  Eat pasta without cream sauces.  Use skim or nonfat milk, and eat low-fat or nonfat yogurt and cheeses.  Do not eat or drink whole  milk, cream, ice cream, egg yolks, or hard cheeses.  Do not eat stick margarine or tub margarines that contain trans fats (also called partially hydrogenated oils).  Do not eat saturated tropical oils, such as coconut oil and palm oil.  Do not eat cakes, cookies, crackers, or other baked goods that contain trans fats.   General instructions  Exercise as directed by your health care provider. Increase your activity level with activities such as gardening, walking, and taking the stairs.  Take over-the-counter and prescription medicines only as told by your health care provider.  Do not use any products that contain nicotine or tobacco, such as cigarettes and e-cigarettes. If you need help quitting, ask your health care provider.  Keep all follow-up visits as told by your health care provider. This is important. Contact a health care provider if:  You are struggling to maintain a healthy diet or weight.  You need help to start on an exercise program.  You need help to stop smoking. Get help right away if:  You have chest pain.  You have trouble breathing. This information is not intended to replace advice given to you by your health care provider. Make sure you discuss any questions you have with your health care provider. Document Released: 01/27/2005 Document Revised: 01/30/2017 Document Reviewed: 07/28/2015 Elsevier Patient Education  2020 [employer].

## 2018-12-22 NOTE — Progress Notes (Signed)
Subjective:  Patient ID: Corey Ochs., male    DOB: Jun 13, 1950, 68 y.o.   MRN: [id number]  Patient Care Team: Baruch Gouty, FNP as PCP - General (Family Medicine) Pyrtle, Lajuan Lines, MD as Consulting Physician (Gastroenterology) Laurin Coder, MD as Consulting Physician (Pulmonary Disease) Minus Breeding, MD as Consulting Physician (Cardiology)   Chief Complaint:  Medical Management of Chronic Issues (3 mo ), Hyperlipidemia, and Hypothyroidism   HPI: Corey Dilauro. is a 68 y.o. male presenting on 12/22/2018 for Medical Management of Chronic Issues (3 mo ), Hyperlipidemia, and Hypothyroidism  1. Acquired hypothyroidism Compliant with medications without associated side effects. No fatigue, weight changes, hair changes, skin or nail changes, or bowel habit changes.   2. Mixed hyperlipidemia Has been taking fenofibrate and crestor but would like to switch back to Vytorin. States he felt better when taking the Vytorin. States he feels worn out with the fenofibrate and crestor. He denies myalgias or dark colored urine. States he just does not feel his best. He does not exercise and does not watch diet.   3. Coronary artery disease involving native coronary artery of native heart with angina pectoris with documented spasm [facility]) Doing well. No reported chest pain or exertional angina. No leg swelling, shortness of breath, palpitations, dizziness, or syncope. Has upcoming appointment with cardiology.     Relevant past medical, surgical, family, and social history reviewed and updated as indicated.  Allergies and medications reviewed and updated. Date reviewed: Chart in Epic.   Past Medical History:  Diagnosis Date  . Angina at rest [facility])   . Benign neoplasm of colon   . CAD (coronary artery disease)     Occluded RCA, 50% Diat 2006, 2008  . Diverticulosis of colon (without mention of hemorrhage)   . Esophageal reflux   . Other and unspecified hyperlipidemia   .  Unspecified disorder of thyroid     Past Surgical History:  Procedure Laterality Date  . TONSILLECTOMY  1955    Social History   Socioeconomic History  . Marital status: Widowed    Spouse name: Not on file  . Number of children: 3  . Years of education: 16  . Highest education level: Bachelor's degree (e.g., BA, AB, BS)  Occupational History  . Occupation: retired     Comment: golf / club prefessional   Social Needs  . Financial resource strain: Not hard at all  . Food insecurity    Worry: Never true    Inability: Never true  . Transportation needs    Medical: No    Non-medical: No  Tobacco Use  . Smoking status: Former Smoker    Packs/day: 0.50    Types: Cigarettes    Quit date: 07/22/1986    Years since quitting: 32.4  . Smokeless tobacco: Never Used  Substance and Sexual Activity  . Alcohol use: Not Currently  . Drug use: No  . Sexual activity: Not Currently  Lifestyle  . Physical activity    Days per week: 7 days    Minutes per session: 30 min  . Stress: Not at all  Relationships  . Social connections    Talks on phone: More than three times a week    Gets together: More than three times a week    Attends religious service: More than 4 times per year    Active member of club or organization: Yes    Attends meetings of clubs or organizations: More than  4 times per year    Relationship status: Widowed  . Intimate partner violence    Fear of current or ex partner: No    Emotionally abused: No    Physically abused: No    Forced sexual activity: No  Other Topics Concern  . Not on file  Social History Narrative  . Not on file    Outpatient Encounter Medications as of 12/22/2018  Medication Sig  . aspirin 81 MG EC tablet Take 81 mg by mouth daily.    . fluticasone (FLONASE) 50 MCG/ACT nasal spray USE 2 SPRAYS IN EACH NOSTRIL DAILY  . levothyroxine (SYNTHROID) 100 MCG tablet daily  . levothyroxine (SYNTHROID) 25 MCG tablet Take 0.5 tab on Mon, Wed and Fri  mornings before breakfast.  . omeprazole (PRILOSEC) 20 MG capsule Take 2 caps po daily  . ranolazine (RANEXA) 500 MG 12 hr tablet Take 1 tablet (500 mg total) by mouth 2 (two) times daily.  . Vitamin D, Ergocalciferol, (DRISDOL) 1.25 MG (50000 UT) CAPS capsule TAKE 1 CAPSULE EVERY WEEK  . [DISCONTINUED] Choline Fenofibrate (FENOFIBRIC ACID) 45 MG CPDR TAKE ONE (1) CAPSULE EACH DAY  . [DISCONTINUED] rosuvastatin (CRESTOR) 20 MG tablet Take 1 tablet (20 mg total) by mouth daily.  Marland Kitchen ezetimibe-simvastatin (VYTORIN) 10-20 MG tablet Take 1 tablet by mouth daily.  . nitroGLYCERIN (NITROSTAT) 0.4 MG SL tablet Place 0.4 mg under the tongue every 5 (five) minutes as needed. Reported on 06/05/2015  . [DISCONTINUED] predniSONE (DELTASONE) 20 MG tablet 2 po at sametime daily for 5 days (Patient not taking: Reported on 10/04/2018)   No facility-administered encounter medications on file as of 12/22/2018.     Allergies  Allergen Reactions  . Imdur [Isosorbide Nitrate]     Headache   . Lipitor [Atorvastatin] Other (See Comments)    myalgias    Review of Systems  Constitutional: Negative for activity change, appetite change, chills, diaphoresis, fatigue, fever and unexpected weight change.  HENT: Negative.   Eyes: Negative.  Negative for photophobia and visual disturbance.  Respiratory: Negative for cough, chest tightness and shortness of breath.   Cardiovascular: Negative for chest pain, palpitations and leg swelling.  Gastrointestinal: Negative for abdominal distention, abdominal pain, anal bleeding, blood in stool, constipation, diarrhea, nausea, rectal pain and vomiting.  Endocrine: Negative.  Negative for cold intolerance, heat intolerance, polydipsia, polyphagia and polyuria.  Genitourinary: Negative for dysuria, frequency and urgency.  Musculoskeletal: Negative for arthralgias, back pain, gait problem and myalgias.  Skin: Negative.   Allergic/Immunologic: Negative.   Neurological: Negative for  dizziness, tremors, seizures, syncope, facial asymmetry, speech difficulty, weakness, light-headedness, numbness and headaches.  Hematological: Negative.   Psychiatric/Behavioral: Negative for confusion, hallucinations, sleep disturbance and suicidal ideas.  All other systems reviewed and are negative.       Objective:  BP 126/76   Pulse 63   Temp 98.9 F (37.2 C)   Resp 20   Ht 6' 1.75" (1.873 m)   Wt 213 lb (96.6 kg)   SpO2 97%   BMI 27.53 kg/m    Wt Readings from Last 3 Encounters:  12/22/18 213 lb (96.6 kg)  09/15/18 217 lb (98.4 kg)  03/05/18 213 lb (96.6 kg)    Physical Exam Vitals signs and nursing note reviewed.  Constitutional:      General: He is not in acute distress.    Appearance: Normal appearance. He is well-developed and well-groomed. He is not ill-appearing, toxic-appearing or diaphoretic.  HENT:     Head: Normocephalic and  atraumatic.     Jaw: There is normal jaw occlusion.     Right Ear: Hearing normal.     Left Ear: Hearing normal.     Nose: Nose normal.     Mouth/Throat:     Lips: Pink.     Mouth: Mucous membranes are moist.     Pharynx: Oropharynx is clear. Uvula midline.  Eyes:     General: Lids are normal.     Extraocular Movements: Extraocular movements intact.     Conjunctiva/sclera: Conjunctivae normal.     Pupils: Pupils are equal, round, and reactive to light.  Neck:     Musculoskeletal: Normal range of motion and neck supple.     Thyroid: No thyroid mass, thyromegaly or thyroid tenderness.     Vascular: No carotid bruit or JVD.     Trachea: Trachea and phonation normal.  Cardiovascular:     Rate and Rhythm: Normal rate and regular rhythm.     Chest Wall: PMI is not displaced.     Pulses: Normal pulses.     Heart sounds: Normal heart sounds. No murmur. No friction rub. No gallop.   Pulmonary:     Effort: Pulmonary effort is normal. No respiratory distress.     Breath sounds: Normal breath sounds. No wheezing.  Abdominal:      General: Bowel sounds are normal. There is no distension or abdominal bruit.     Palpations: Abdomen is soft. There is no hepatomegaly or splenomegaly.     Tenderness: There is no abdominal tenderness. There is no right CVA tenderness or left CVA tenderness.  Musculoskeletal: Normal range of motion.     Right lower leg: No edema.     Left lower leg: No edema.  Lymphadenopathy:     Cervical: No cervical adenopathy.  Skin:    General: Skin is warm and dry.     Capillary Refill: Capillary refill takes less than 2 seconds.     Coloration: Skin is not cyanotic, jaundiced or pale.     Findings: No rash.  Neurological:     General: No focal deficit present.     Mental Status: He is alert and oriented to person, place, and time.     Cranial Nerves: Cranial nerves are intact. No cranial nerve deficit.     Sensory: Sensation is intact. No sensory deficit.     Motor: Motor function is intact. No weakness.     Coordination: Coordination is intact. Coordination normal.     Gait: Gait is intact. Gait normal.     Deep Tendon Reflexes: Reflexes are normal and symmetric. Reflexes normal.  Psychiatric:        Attention and Perception: Attention and perception normal.        Mood and Affect: Mood and affect normal.        Speech: Speech normal.        Behavior: Behavior normal. Behavior is cooperative.        Thought Content: Thought content normal.        Cognition and Memory: Cognition and memory normal.        Judgment: Judgment normal.     Results for orders placed or performed in visit on 09/15/18  CMP14+EGFR  Result Value Ref Range   Glucose 104 (H) 65 - 99 mg/dL   BUN 13 8 - 27 mg/dL   Creatinine, Ser 1.30 (H) 0.76 - 1.27 mg/dL   GFR calc non Af Amer 56 (L) >59 mL/min/1.73   GFR calc  Af Amer 64 >59 mL/min/1.73   BUN/Creatinine Ratio 10 10 - 24   Sodium 140 134 - 144 mmol/L   Potassium 4.4 3.5 - 5.2 mmol/L   Chloride 100 96 - 106 mmol/L   CO2 24 20 - 29 mmol/L   Calcium 9.3 8.6 -  10.2 mg/dL   Total Protein 7.4 6.0 - 8.5 g/dL   Albumin 4.6 3.8 - 4.8 g/dL   Globulin, Total 2.8 1.5 - 4.5 g/dL   Albumin/Globulin Ratio 1.6 1.2 - 2.2   Bilirubin Total 0.4 0.0 - 1.2 mg/dL   Alkaline Phosphatase 72 39 - 117 IU/L   AST 36 0 - 40 IU/L   ALT 48 (H) 0 - 44 IU/L  CBC with Differential/Platelet  Result Value Ref Range   WBC 4.8 3.4 - 10.8 x10E3/uL   RBC 4.39 4.14 - 5.80 x10E6/uL   Hemoglobin 13.0 13.0 - 17.7 g/dL   Hematocrit 39.5 37.5 - 51.0 %   MCV 90 79 - 97 fL   MCH 29.6 26.6 - 33.0 pg   MCHC 32.9 31.5 - 35.7 g/dL   RDW 14.2 11.6 - 15.4 %   Platelets 258 150 - 450 x10E3/uL   Neutrophils 52 Not Estab. %   Lymphs 35 Not Estab. %   Monocytes 9 Not Estab. %   Eos 3 Not Estab. %   Basos 1 Not Estab. %   Neutrophils Absolute 2.5 1.4 - 7.0 x10E3/uL   Lymphocytes Absolute 1.7 0.7 - 3.1 x10E3/uL   Monocytes Absolute 0.4 0.1 - 0.9 x10E3/uL   EOS (ABSOLUTE) 0.1 0.0 - 0.4 x10E3/uL   Basophils Absolute 0.0 0.0 - 0.2 x10E3/uL   Immature Granulocytes 0 Not Estab. %   Immature Grans (Abs) 0.0 0.0 - 0.1 x10E3/uL  Lipid panel  Result Value Ref Range   Cholesterol, Total 201 (H) 100 - 199 mg/dL   Triglycerides 449 (H) 0 - 149 mg/dL   HDL 33 (L) >39 mg/dL   VLDL Cholesterol Cal Comment 5 - 40 mg/dL   LDL Calculated Comment 0 - 99 mg/dL   Chol/HDL Ratio 6.1 (H) 0.0 - 5.0 ratio  Thyroid Panel With TSH  Result Value Ref Range   TSH 3.380 0.450 - 4.500 uIU/mL   T4, Total 8.9 4.5 - 12.0 ug/dL   T3 Uptake Ratio 29 24 - 39 %   Free Thyroxine Index 2.6 1.2 - 4.9  VITAMIN D 25 Hydroxy (Vit-D Deficiency, Fractures)  Result Value Ref Range   Vit D, 25-Hydroxy 49.1 30.0 - 100.0 ng/mL       Pertinent labs & imaging results that were available during my care of the patient were reviewed by me and considered in my medical decision making.  Assessment & Plan:  [name] was seen today for medical management of chronic issues, hyperlipidemia and hypothyroidism.  Diagnoses and all  orders for this visit:  Acquired hypothyroidism Thyroid disease has been well controlled. Labs are pending. Adjustments to regimen will be made if warranted. Make sure to take medications on an empty stomach with a full glass of water. Make sure to avoid vitamins or supplements for at least 4 hours before and 4 hours after taking medications. Repeat labs in 3 months if adjustments are made and in 6 months if stable.    Mixed hyperlipidemia Poorly controlled. Did not like taking fenofibrate and Crestor. Requested to restart Vytorin. Had poor control on Vytorin in the past. Has upcoming appointment with cardiology, will ask about Repatha to see if this  is an option for him due to lack of control with other treatment regimens.  -     ezetimibe-simvastatin (VYTORIN) 10-20 MG tablet; Take 1 tablet by mouth daily. -     CMP14+EGFR -     Lipid panel  Coronary artery disease involving native coronary artery of native heart with angina pectoris with documented spasm ([place]) Followed by cardiology. Has upcoming appointment. Doing well without chest pain, palpitations, dyspnea, exertional angina, dizziness, or syncope. Follow up with cardiology as scheduled.   Function kidney decreased Previous renal function labs abnormal, will recheck today.  -     CMP14+EGFR     Continue all other maintenance medications.  Follow up plan: Return in about 3 months (around 03/24/2019), or if symptoms worsen or fail to improve.  Continue healthy lifestyle choices, including diet (rich in fruits, vegetables, and lean proteins, and low in salt and simple carbohydrates) and exercise (at least 30 minutes of moderate physical activity daily).  Educational handout given for high cholesterol   The above assessment and management plan was discussed with the patient. The patient verbalized understanding of and has agreed to the management plan. Patient is aware to call the clinic if they develop any new symptoms or if symptoms  persist or worsen. Patient is aware when to return to the clinic for a follow-up visit. Patient educated on when it is appropriate to go to the emergency department.   Monia Pouch, FNP-C [facility] [phone]

## 2018-12-23 LAB — CMP14+EGFR
ALT: 40 IU/L (ref 0–44)
AST: 36 IU/L (ref 0–40)
Albumin/Globulin Ratio: 1.6 (ref 1.2–2.2)
Albumin: 4.7 g/dL (ref 3.8–4.8)
Alkaline Phosphatase: 76 IU/L (ref 39–117)
BUN/Creatinine Ratio: 12 (ref 10–24)
BUN: 15 mg/dL (ref 8–27)
Bilirubin Total: 0.4 mg/dL (ref 0.0–1.2)
CO2: 22 mmol/L (ref 20–29)
Calcium: 9.6 mg/dL (ref 8.6–10.2)
Chloride: 99 mmol/L (ref 96–106)
Creatinine, Ser: 1.3 mg/dL — ABNORMAL HIGH (ref 0.76–1.27)
GFR calc Af Amer: 64 mL/min/{1.73_m2} (ref >59)
GFR calc non Af Amer: 56 mL/min/{1.73_m2} — ABNORMAL LOW (ref >59)
Globulin, Total: 3 g/dL (ref 1.5–4.5)
Glucose: 112 mg/dL — ABNORMAL HIGH (ref 65–99)
Potassium: 4.4 mmol/L (ref 3.5–5.2)
Sodium: 137 mmol/L (ref 134–144)
Total Protein: 7.7 g/dL (ref 6.0–8.5)

## 2018-12-23 LAB — LIPID PANEL
Chol/HDL Ratio: 7 ratio — ABNORMAL HIGH (ref 0.0–5.0)
Cholesterol, Total: 245 mg/dL — ABNORMAL HIGH (ref 100–199)
HDL: 35 mg/dL — ABNORMAL LOW (ref >39)
LDL Chol Calc (NIH): 88 mg/dL (ref 0–99)
Triglycerides: 736 mg/dL (ref 0–149)
VLDL Cholesterol Cal: 122 mg/dL — ABNORMAL HIGH (ref 5–40)

## 2019-02-14 DIAGNOSIS — Z7189 Other specified counseling: Secondary | ICD-10-CM | POA: Insufficient documentation

## 2019-02-14 DIAGNOSIS — E785 Hyperlipidemia, unspecified: Secondary | ICD-10-CM | POA: Insufficient documentation

## 2019-02-14 NOTE — Progress Notes (Signed)
Cardiology Office Note   Date:  02/16/2019   ID:  Corey Ochs., DOB 10-20-1950, MRN [id number]  PCP:  Baruch Gouty, FNP  Cardiologist:   No primary care provider on file.   Chief Complaint  Patient presents with  . Chest Pain      History of Present Illness: [name]. is a 69 y.o. male who presents for follow up of CAD.  He's had coronary disease with catheterizations in 2006 and 2008 demonstrating an occluded right coronary artery. He had nonobstructive disease in a diagonal. He was managed medically. His only symptom in 2006 was some dyspnea. However, he had abnormal stress test lead to catheterization. He never tolerated Imdur. He has been treated with Ranexa.    Since I last saw him he has done okay.  He had CPAP but the company wanted to take it away from him.  Its not clear how compliant he was but he says they really did not work with him.  He might of felt better he thought he could use it.  He has not had any new cardiovascular complaints. The patient denies any new symptoms such as chest discomfort, neck or arm discomfort. There has been no new shortness of breath, PND or orthopnea. There have been no reported palpitations, presyncope or syncope.   Past Medical History:  Diagnosis Date  . Angina at rest [facility])   . Benign neoplasm of colon   . CAD (coronary artery disease)     Occluded RCA, 50% Diat 2006, 2008  . Diverticulosis of colon (without mention of hemorrhage)   . Esophageal reflux   . Other and unspecified hyperlipidemia   . Unspecified disorder of thyroid     Past Surgical History:  Procedure Laterality Date  . TONSILLECTOMY  1955     Current Outpatient Medications  Medication Sig Dispense Refill  . aspirin 81 MG EC tablet Take 81 mg by mouth daily.      Marland Kitchen ezetimibe-simvastatin (VYTORIN) 10-20 MG tablet Take 1 tablet by mouth daily. 30 tablet 6  . fluticasone (FLONASE) 50 MCG/ACT nasal spray USE 2 SPRAYS IN EACH NOSTRIL DAILY 16 g  11  . levothyroxine (SYNTHROID) 100 MCG tablet daily (Patient taking differently: Take 100 mcg by mouth daily. daily) 90 tablet 2  . levothyroxine (SYNTHROID) 25 MCG tablet Take 0.5 tab on Mon, Wed and Fri mornings before breakfast. 36 tablet 3  . nitroGLYCERIN (NITROSTAT) 0.4 MG SL tablet Place 0.4 mg under the tongue every 5 (five) minutes as needed. Reported on 06/05/2015    . omeprazole (PRILOSEC) 20 MG capsule Take 2 caps po daily 180 capsule 3  . ranolazine (RANEXA) 500 MG 12 hr tablet Take 1 tablet (500 mg total) by mouth 2 (two) times daily. 180 tablet 3  . Vitamin D, Ergocalciferol, (DRISDOL) 1.25 MG (50000 UT) CAPS capsule TAKE 1 CAPSULE EVERY WEEK 12 capsule 3   No current facility-administered medications for this visit.    Allergies:   Imdur [isosorbide nitrate] and Lipitor [atorvastatin]    ROS:  Please see the history of present illness.   Otherwise, review of systems are positive for none.   All other systems are reviewed and negative.    PHYSICAL EXAM: VS:  BP 140/82   Pulse 69   Ht 6\' 3"  (1.905 m)   Wt 216 lb (98 kg)   BMI 27.00 kg/m  , BMI Body mass index is 27 kg/m. GENERAL:  Well appearing NECK:  No jugular venous distention, waveform within normal limits, carotid upstroke brisk and symmetric, no bruits, no thyromegaly LUNGS:  Clear to auscultation bilaterally CHEST:  Unremarkable HEART:  PMI not displaced or sustained,S1 and S2 within normal limits, no S3, no S4, no clicks, no rubs, no murmurs ABD:  Flat, positive bowel sounds normal in frequency in pitch, no bruits, no rebound, no guarding, no midline pulsatile mass, no hepatomegaly, no splenomegaly EXT:  2 plus pulses throughout, no edema, no cyanosis no clubbing   EKG:  EKG is ordered today. The ekg ordered today demonstrates normal sinus rhythm, rate 69, axis within normal limits, intervals within normal limits, no acute ST-T wave changes.   Recent Labs: 09/15/2018: Hemoglobin 13.0; Platelets 258; TSH  3.380 12/22/2018: ALT 40; BUN 15; Creatinine, Ser 1.30; Potassium 4.4; Sodium 137    Lipid Panel    Component Value Date/Time   CHOL 245 (H) 12/22/2018 1531   CHOL 202 (H) 10/15/2012 0854   TRIG 736 (HH) 12/22/2018 1531   TRIG 590 (HH) 06/08/2014 1703   TRIG 241 (H) 10/15/2012 0854   HDL 35 (L) 12/22/2018 1531   HDL 43 06/08/2014 1703   HDL 39 (L) 10/15/2012 0854   CHOLHDL 7.0 (H) 12/22/2018 1531   CHOLHDL 5.9 CALC 01/18/2007 0846   VLDL 55 (H) 01/18/2007 0846   LDLCALC 88 12/22/2018 1531   LDLCALC 115 (H) 10/15/2012 0854   LDLDIRECT 72 05/13/2016 1100   LDLDIRECT 124.9 01/18/2007 0846      Wt Readings from Last 3 Encounters:  02/16/19 216 lb (98 kg)  12/22/18 213 lb (96.6 kg)  09/15/18 217 lb (98.4 kg)      Other studies Reviewed: Additional studies/ records that were reviewed today include: Labs. Review of the above records demonstrates:  Please see elsewhere in the note.     ASSESSMENT AND PLAN:  CAD:   The patient has no new sypmtoms.  No further cardiovascular testing is indicated.  We will continue with aggressive risk reduction and meds as listed.  DYSLIPIDEMIA:    His triglycerides were 736.  He really could not afford the Vascepa and was taken off of this and put on Vytorin.  I am going to get a fasting lipid profile liver enzymes.   SLEEP APNEA:  He apparently was found to have severe sleep apnea although I have not seen these results.  He really wants to work on this and would like a new referral for sleep physician and so I will send him to Dr. Celedonio Miyamoto.   COVID EDUCATION: We talked about the vaccine and his questions were answered.  Current medicines are reviewed at length with the patient today.  The patient does not have concerns regarding medicines.  The following changes have been made:  no change  Labs/ tests ordered today include: As above.   Orders Placed This Encounter  Procedures  . Hepatic function panel  . Lipid Profile  . EKG 12-Lead      Disposition:   FU with me in one year.     Signed, Minus Breeding, MD  02/16/2019 4:12 PM    [facility]

## 2019-02-16 ENCOUNTER — Encounter: Payer: Self-pay | Admitting: Cardiology

## 2019-02-16 ENCOUNTER — Ambulatory Visit: Payer: Medicare HMO | Admitting: Cardiology

## 2019-02-16 ENCOUNTER — Other Ambulatory Visit: Payer: Self-pay

## 2019-02-16 VITALS — BP 140/82 | HR 69 | Ht 75.0 in | Wt 216.0 lb

## 2019-02-16 DIAGNOSIS — I2510 Atherosclerotic heart disease of native coronary artery without angina pectoris: Secondary | ICD-10-CM | POA: Diagnosis not present

## 2019-02-16 DIAGNOSIS — Z7189 Other specified counseling: Secondary | ICD-10-CM

## 2019-02-16 DIAGNOSIS — Z79899 Other long term (current) drug therapy: Secondary | ICD-10-CM | POA: Diagnosis not present

## 2019-02-16 DIAGNOSIS — E785 Hyperlipidemia, unspecified: Secondary | ICD-10-CM

## 2019-02-16 NOTE — Patient Instructions (Addendum)
Medication Instructions:  The current medical regimen is effective;  continue present plan and medications.  *If you need a refill on your cardiac medications before your next appointment, please call your pharmacy*  Lab Work: Please have fasting Lipid and liver panel.  If you have labs (blood work) drawn today and your tests are completely normal, you will receive your results only by: Marland Kitchen MyChart Message (if you have MyChart) OR . A paper copy in the mail If you have any lab test that is abnormal or we need to change your treatment, we will call you to review the results.  You have been referred to see Dr Shelva Majestic for further evaluation for sleep apnea.  You will be contacted to be scheduled for this appointment.  Follow-Up: At [facility], you and your health needs are our priority.  As part of our continuing mission to provide you with exceptional heart care, we have created designated Provider Care Teams.  These Care Teams include your primary Cardiologist (physician) and Advanced Practice Providers (APPs -  Physician Assistants and Nurse Practitioners) who all work together to provide you with the care you need, when you need it.  Your next appointment:   12 month(s)  The format for your next appointment:   In Person  Provider:   Minus Breeding, MD  Thank you for choosing [facility]!!

## 2019-02-17 ENCOUNTER — Telehealth: Payer: Self-pay | Admitting: Cardiology

## 2019-02-17 NOTE — Telephone Encounter (Signed)
LMTCB to schedule appt with Dr. Claiborne Billings on a sleep day.

## 2019-02-17 NOTE — Telephone Encounter (Signed)
-----   Message from Lauralee Evener, [place] sent at 02/17/2019  8:45 AM EST ----- Regarding: RE: appt with Dr Claiborne Billings  ----- Message ----- From: Janine Limbo Sent: 02/17/2019   8:29 AM EST To: Lauralee Evener, CMA Subject: FW: appt with Dr Claiborne Billings                          ----- Message ----- From: Shellia Cleverly, RN Sent: 02/16/2019   3:57 PM EST To: Cv Div Nl Scheduling Subject: appt with Dr Claiborne Billings                             This pt needs to be scheduled to see Dr Claiborne Billings for further evaluation of sleep apnea per Dr Percival Spanish.  Please contact the patient to schedule.  Thank you,  Pam, RN

## 2019-02-25 ENCOUNTER — Ambulatory Visit (INDEPENDENT_AMBULATORY_CARE_PROVIDER_SITE_OTHER): Payer: Medicare HMO | Admitting: Family Medicine

## 2019-02-25 ENCOUNTER — Telehealth: Payer: Self-pay | Admitting: Family Medicine

## 2019-02-25 ENCOUNTER — Encounter: Payer: Self-pay | Admitting: Family Medicine

## 2019-02-25 ENCOUNTER — Ambulatory Visit (INDEPENDENT_AMBULATORY_CARE_PROVIDER_SITE_OTHER): Payer: Medicare HMO

## 2019-02-25 DIAGNOSIS — R109 Unspecified abdominal pain: Secondary | ICD-10-CM

## 2019-02-25 DIAGNOSIS — R1031 Right lower quadrant pain: Secondary | ICD-10-CM

## 2019-02-25 DIAGNOSIS — K5900 Constipation, unspecified: Secondary | ICD-10-CM | POA: Diagnosis not present

## 2019-02-25 LAB — MICROSCOPIC EXAMINATION
Bacteria, UA: NONE SEEN
Epithelial Cells (non renal): NONE SEEN /hpf (ref 0–10)
Renal Epithel, UA: NONE SEEN /hpf
WBC, UA: NONE SEEN /hpf (ref 0–5)

## 2019-02-25 LAB — URINALYSIS, COMPLETE
Bilirubin, UA: NEGATIVE
Glucose, UA: NEGATIVE
Ketones, UA: NEGATIVE
Leukocytes,UA: NEGATIVE
Nitrite, UA: NEGATIVE
Protein,UA: NEGATIVE
Specific Gravity, UA: 1.015 (ref 1.005–1.030)
Urobilinogen, Ur: 0.2 mg/dL (ref 0.2–1.0)
pH, UA: 6 (ref 5.0–7.5)

## 2019-02-25 MED ORDER — POLYETHYLENE GLYCOL 3350 17 GM/SCOOP PO POWD: 17.0000 g | Freq: Two times a day (BID) | ORAL | 1 refills | Status: DC | PRN

## 2019-02-25 NOTE — Telephone Encounter (Signed)
Having back issues and abd pain  No N/V/D  Tele made for today

## 2019-02-25 NOTE — Telephone Encounter (Signed)
Patient requested to speak with [name]. Has a few questions about his health.

## 2019-02-25 NOTE — Progress Notes (Signed)
Virtual Visit via telephone Note Due to COVID-19 pandemic this visit was conducted virtually. This visit type was conducted due to national recommendations for restrictions regarding the COVID-19 Pandemic (e.g. social distancing, sheltering in place) in an effort to limit this patient's exposure and mitigate transmission in our community. All issues noted in this document were discussed and addressed.  A physical exam was not performed with this format.   I connected with Corey Ochs. on 02/25/2019 at 1505 by telephone and verified that I am speaking with the correct person using two identifiers. Corey Ochs. is currently located at home and alone is currently with them during visit. The provider, Monia Pouch, FNP is located in their office at time of visit.  I discussed the limitations, risks, security and privacy concerns of performing an evaluation and management service by telephone and the availability of in person appointments. I also discussed with the patient that there may be a patient responsible charge related to this service. The patient expressed understanding and agreed to proceed.  Subjective:  Patient ID: Corey Ochs., male    DOB: 11-05-1950, 69 y.o.   MRN: [id number]  Chief Complaint:  Abdominal Pain   HPI: Corey Trulson. is a 69 y.o. male presenting on 02/25/2019 for Abdominal Pain   Patient reports history of intermittent right lower abdominal and flank pain that has become more consistent over the last few days.  He states he has noticed a change in his bowel habits, he is going less frequently and feels bloated.  He denies nausea, vomiting, or diarrhea.  States his stools are a little bit harder than normal but he is able to go.  He states the pain starts in his right lower abdomen and radiates to his right flank.  States is an aching and cramping pain, 6 out of 10 at worst.  He denies any melena or hematochezia.  He denies any urinary symptoms.   States certain movements can make the pain worse but it aches all the time.  States he did have some ice cream and peanuts that he thought may be causative.  This was several days ago and symptoms still persist.  States he has noticed a slight decrease in appetite over the last 2 days.  Abdominal Pain This is a new problem. The current episode started 1 to 4 weeks ago. The onset quality is gradual. The problem occurs constantly. The problem has been waxing and waning. The pain is located in the RLQ, periumbilical region and right flank. The pain is at a severity of 6/10. The pain is moderate. The quality of the pain is aching, cramping and a sensation of fullness. The abdominal pain radiates to the right flank. Associated symptoms include anorexia and constipation. Pertinent negatives include no arthralgias, belching, diarrhea, dysuria, fever, flatus, frequency, headaches, hematochezia, hematuria, melena, myalgias, nausea, vomiting or weight loss. The pain is aggravated by certain positions and movement. The pain is relieved by nothing.     Relevant past medical, surgical, family, and social history reviewed and updated as indicated.  Allergies and medications reviewed and updated.   Past Medical History:  Diagnosis Date  . Angina at rest [facility])   . Benign neoplasm of colon   . CAD (coronary artery disease)     Occluded RCA, 50% Diat 2006, 2008  . Diverticulosis of colon (without mention of hemorrhage)   . Esophageal reflux   . Other and unspecified hyperlipidemia   .  Unspecified disorder of thyroid     Past Surgical History:  Procedure Laterality Date  . TONSILLECTOMY  1955    Social History   Socioeconomic History  . Marital status: Widowed    Spouse name: Not on file  . Number of children: 3  . Years of education: 16  . Highest education level: Bachelor's degree (e.g., BA, AB, BS)  Occupational History  . Occupation: retired     Comment: golf / club prefessional   Tobacco  Use  . Smoking status: Former Smoker    Packs/day: 0.50    Types: Cigarettes    Quit date: 07/22/1986    Years since quitting: 32.6  . Smokeless tobacco: Never Used  Substance and Sexual Activity  . Alcohol use: Not Currently  . Drug use: No  . Sexual activity: Not Currently  Other Topics Concern  . Not on file  Social History Narrative  . Not on file   Social Determinants of Health   Financial Resource Strain: Low Risk   . Difficulty of Paying Living Expenses: Not hard at all  Food Insecurity: No Food Insecurity  . Worried About Charity fundraiser in the Last Year: Never true  . Ran Out of Food in the Last Year: Never true  Transportation Needs: No Transportation Needs  . Lack of Transportation (Medical): No  . Lack of Transportation (Non-Medical): No  Physical Activity: Sufficiently Active  . Days of Exercise per Week: 7 days  . Minutes of Exercise per Session: 30 min  Stress: No Stress Concern Present  . Feeling of Stress : Not at all  Social Connections: Slightly Isolated  . Frequency of Communication with Friends and Family: More than three times a week  . Frequency of Social Gatherings with Friends and Family: More than three times a week  . Attends Religious Services: More than 4 times per year  . Active Member of Clubs or Organizations: Yes  . Attends Archivist Meetings: More than 4 times per year  . Marital Status: Widowed  Intimate Partner Violence: Not At Risk  . Fear of Current or Ex-Partner: No  . Emotionally Abused: No  . Physically Abused: No  . Sexually Abused: No    Outpatient Encounter Medications as of 02/25/2019  Medication Sig  . aspirin 81 MG EC tablet Take 81 mg by mouth daily.    Marland Kitchen ezetimibe-simvastatin (VYTORIN) 10-20 MG tablet Take 1 tablet by mouth daily.  . fluticasone (FLONASE) 50 MCG/ACT nasal spray USE 2 SPRAYS IN EACH NOSTRIL DAILY  . levothyroxine (SYNTHROID) 100 MCG tablet daily (Patient taking differently: Take 100 mcg by  mouth daily. daily)  . levothyroxine (SYNTHROID) 25 MCG tablet Take 0.5 tab on Mon, Wed and Fri mornings before breakfast.  . nitroGLYCERIN (NITROSTAT) 0.4 MG SL tablet Place 0.4 mg under the tongue every 5 (five) minutes as needed. Reported on 06/05/2015  . omeprazole (PRILOSEC) 20 MG capsule Take 2 caps po daily  . polyethylene glycol powder (GLYCOLAX/MIRALAX) 17 GM/SCOOP powder Take 17 g by mouth 2 (two) times daily as needed.  . ranolazine (RANEXA) 500 MG 12 hr tablet Take 1 tablet (500 mg total) by mouth 2 (two) times daily.  . Vitamin D, Ergocalciferol, (DRISDOL) 1.25 MG (50000 UT) CAPS capsule TAKE 1 CAPSULE EVERY WEEK   No facility-administered encounter medications on file as of 02/25/2019.    Allergies  Allergen Reactions  . Imdur [Isosorbide Nitrate]     Headache   . Lipitor [Atorvastatin] Other (See  Comments)    myalgias    Review of Systems  Constitutional: Positive for appetite change. Negative for activity change, chills, diaphoresis, fatigue, fever, unexpected weight change and weight loss.  HENT: Negative.   Eyes: Negative.  Negative for photophobia and visual disturbance.  Respiratory: Negative for cough, chest tightness and shortness of breath.   Cardiovascular: Negative for chest pain, palpitations and leg swelling.  Gastrointestinal: Positive for abdominal pain, anorexia and constipation. Negative for abdominal distention, anal bleeding, blood in stool, diarrhea, flatus, hematochezia, melena, nausea, rectal pain and vomiting.  Endocrine: Negative.   Genitourinary: Positive for flank pain. Negative for decreased urine volume, difficulty urinating, discharge, dysuria, frequency, hematuria, penile pain, penile swelling, scrotal swelling, testicular pain and urgency.  Musculoskeletal: Negative for arthralgias and myalgias.  Skin: Negative.  Negative for color change and pallor.  Allergic/Immunologic: Negative.   Neurological: Negative for dizziness, weakness and  headaches.  Hematological: Negative.   Psychiatric/Behavioral: Negative for confusion, hallucinations, sleep disturbance and suicidal ideas.  All other systems reviewed and are negative.        Observations/Objective: No vital signs or physical exam, this was a telephone or virtual health encounter.  Pt alert and oriented, answers all questions appropriately, and able to speak in full sentences.   X-Ray: KUB: significant stool burden, gas throughout, no obvious bowel obstruction. Slight distention. Preliminary x-ray reading by Monia Pouch, FNP-C, [facility].  Trace of blood on urinalysis but no other abnormalities. Will recheck in 2-4 weeks.   Assessment and Plan: [name] was seen today for abdominal pain.  Diagnoses and all orders for this visit:  Right lower quadrant abdominal pain Right flank pain KUB with moderate stool burden and gas throughout. No obvious renal stone or obstruction. Will notify pt if radiology reading differs. Labs pending. Miralax clean out information sent to pts MyChart. Trace blood on urinalysis but not other abnormalities, will recheck in 2-4 weeks.  -     CBC with Differential/Platelet -     CMP14+EGFR -     urinalysis- dip and micro -     KUB; Future  Constipation in male Constipation. Increase water and fiber intake. Miralax clean out as discussed. Report any new, worsening, or persistent symptoms.  -     polyethylene glycol powder (GLYCOLAX/MIRALAX) 17 GM/SCOOP powder; Take 17 g by mouth 2 (two) times daily as needed.     Follow Up Instructions: Return if symptoms worsen or fail to improve.    I discussed the assessment and treatment plan with the patient. The patient was provided an opportunity to ask questions and all were answered. The patient agreed with the plan and demonstrated an understanding of the instructions.   The patient was advised to call back or seek an in-person evaluation if the symptoms worsen or if the condition fails to  improve as anticipated.  The above assessment and management plan was discussed with the patient. The patient verbalized understanding of and has agreed to the management plan. Patient is aware to call the clinic if they develop any new symptoms or if symptoms persist or worsen. Patient is aware when to return to the clinic for a follow-up visit. Patient educated on when it is appropriate to go to the emergency department.    I provided 25 minutes of non-face-to-face time during this encounter. The call started at 1505. The call ended at 1510. Pt called back after imaging and urinalysis to discuss results and treatment regimen. This call took place at 1430 and ended at  1435. The other time was used for coordination of care.    Monia Pouch, FNP-C [facility] [address] [place], [place] [address] [phone] 02/25/2019

## 2019-02-26 LAB — CBC WITH DIFFERENTIAL/PLATELET
Basophils Absolute: 0.1 10*3/uL (ref 0.0–0.2)
Basos: 1 %
EOS (ABSOLUTE): 0.3 10*3/uL (ref 0.0–0.4)
Eos: 3 %
Hematocrit: 40.7 % (ref 37.5–51.0)
Hemoglobin: 14 g/dL (ref 13.0–17.7)
Immature Grans (Abs): 0.1 10*3/uL (ref 0.0–0.1)
Immature Granulocytes: 1 %
Lymphocytes Absolute: 2.2 10*3/uL (ref 0.7–3.1)
Lymphs: 22 %
MCH: 29.7 pg (ref 26.6–33.0)
MCHC: 34.4 g/dL (ref 31.5–35.7)
MCV: 86 fL (ref 79–97)
Monocytes Absolute: 0.8 10*3/uL (ref 0.1–0.9)
Monocytes: 8 %
Neutrophils Absolute: 6.5 10*3/uL (ref 1.4–7.0)
Neutrophils: 65 %
Platelets: 307 10*3/uL (ref 150–450)
RBC: 4.71 x10E6/uL (ref 4.14–5.80)
RDW: 14.2 % (ref 11.6–15.4)
WBC: 9.8 10*3/uL (ref 3.4–10.8)

## 2019-02-26 LAB — CMP14+EGFR
ALT: 32 IU/L (ref 0–44)
AST: 21 IU/L (ref 0–40)
Albumin/Globulin Ratio: 1.5 (ref 1.2–2.2)
Albumin: 4.7 g/dL (ref 3.8–4.8)
Alkaline Phosphatase: 90 IU/L (ref 39–117)
BUN/Creatinine Ratio: 10 (ref 10–24)
BUN: 11 mg/dL (ref 8–27)
Bilirubin Total: 0.8 mg/dL (ref 0.0–1.2)
CO2: 25 mmol/L (ref 20–29)
Calcium: 9.6 mg/dL (ref 8.6–10.2)
Chloride: 96 mmol/L (ref 96–106)
Creatinine, Ser: 1.14 mg/dL (ref 0.76–1.27)
GFR calc Af Amer: 75 mL/min/{1.73_m2} (ref >59)
GFR calc non Af Amer: 65 mL/min/{1.73_m2} (ref >59)
Globulin, Total: 3.1 g/dL (ref 1.5–4.5)
Glucose: 97 mg/dL (ref 65–99)
Potassium: 4.3 mmol/L (ref 3.5–5.2)
Sodium: 136 mmol/L (ref 134–144)
Total Protein: 7.8 g/dL (ref 6.0–8.5)

## 2019-03-17 DIAGNOSIS — R69 Illness, unspecified: Secondary | ICD-10-CM | POA: Diagnosis not present

## 2019-03-28 ENCOUNTER — Other Ambulatory Visit: Payer: Self-pay

## 2019-03-29 ENCOUNTER — Encounter: Payer: Self-pay | Admitting: Family Medicine

## 2019-03-29 ENCOUNTER — Ambulatory Visit (INDEPENDENT_AMBULATORY_CARE_PROVIDER_SITE_OTHER): Payer: Medicare HMO | Admitting: Family Medicine

## 2019-03-29 VITALS — BP 127/68 | HR 58 | Temp 98.0°F | Resp 20 | Ht 75.0 in | Wt 216.0 lb

## 2019-03-29 DIAGNOSIS — I25111 Atherosclerotic heart disease of native coronary artery with angina pectoris with documented spasm: Secondary | ICD-10-CM | POA: Diagnosis not present

## 2019-03-29 DIAGNOSIS — I700 Atherosclerosis of aorta: Secondary | ICD-10-CM | POA: Diagnosis not present

## 2019-03-29 DIAGNOSIS — E039 Hypothyroidism, unspecified: Secondary | ICD-10-CM

## 2019-03-29 DIAGNOSIS — E559 Vitamin D deficiency, unspecified: Secondary | ICD-10-CM | POA: Diagnosis not present

## 2019-03-29 DIAGNOSIS — E782 Mixed hyperlipidemia: Secondary | ICD-10-CM

## 2019-03-29 NOTE — Progress Notes (Signed)
Subjective:  Patient ID: Corey Ochs., male    DOB: 12-08-1950, 69 y.o.   MRN: [id number]  Patient Care Team: Baruch Gouty, FNP as PCP - General (Family Medicine) Pyrtle, Lajuan Lines, MD as Consulting Physician (Gastroenterology) Laurin Coder, MD as Consulting Physician (Pulmonary Disease) Minus Breeding, MD as Consulting Physician (Cardiology)   Chief Complaint:  Medical Management of Chronic Issues (3 mo ), Hyperlipidemia, and Hypothyroidism   HPI: [name]. is a 69 y.o. male presenting on 03/29/2019 for Medical Management of Chronic Issues (3 mo ), Hyperlipidemia, and Hypothyroidism  1. Aortic atherosclerosis (HCC) Currently on ASA and statin regimen.   2. Coronary artery disease involving native coronary artery of native heart with angina pectoris with documented spasm (HCC) Currently on ASA and statin regimen. Denies chest pain, SHOB or palpitations.   3. Acquired hypothyroidism Denies fatigue, intolerance heat or cold, changes in mood or appetite.   4. Vitamin D deficiency Denies fatigue or changes in mood.   5. Mixed hyperlipidemia Reports diet that consists of healthy proteins, broiled and grilled fishes and chicken,  fruits and vegetables, denies diet heavy in fried/fatty or greasy foods. Gets moderate physical activity.  Tolerating vytorin well, denies myalgias or other side effects.      Relevant past medical, surgical, family, and social history reviewed and updated as indicated.  Allergies and medications reviewed and updated. Date reviewed: Chart in Epic.   Past Medical History:  Diagnosis Date  . Angina at rest [facility])   . Benign neoplasm of colon   . CAD (coronary artery disease)     Occluded RCA, 50% Diat 2006, 2008  . Diverticulosis of colon (without mention of hemorrhage)   . Esophageal reflux   . Other and unspecified hyperlipidemia   . Unspecified disorder of thyroid     Past Surgical History:  Procedure Laterality Date  .  TONSILLECTOMY  1955    Social History   Socioeconomic History  . Marital status: Widowed    Spouse name: Not on file  . Number of children: 3  . Years of education: 16  . Highest education level: Bachelor's degree (e.g., BA, AB, BS)  Occupational History  . Occupation: retired     Comment: golf / club prefessional   Tobacco Use  . Smoking status: Former Smoker    Packs/day: 0.50    Types: Cigarettes    Quit date: 07/22/1986    Years since quitting: 32.7  . Smokeless tobacco: Never Used  Substance and Sexual Activity  . Alcohol use: Not Currently  . Drug use: No  . Sexual activity: Not Currently  Other Topics Concern  . Not on file  Social History Narrative  . Not on file   Social Determinants of Health   Financial Resource Strain: Low Risk   . Difficulty of Paying Living Expenses: Not hard at all  Food Insecurity: No Food Insecurity  . Worried About Charity fundraiser in the Last Year: Never true  . Ran Out of Food in the Last Year: Never true  Transportation Needs: No Transportation Needs  . Lack of Transportation (Medical): No  . Lack of Transportation (Non-Medical): No  Physical Activity: Sufficiently Active  . Days of Exercise per Week: 7 days  . Minutes of Exercise per Session: 30 min  Stress: No Stress Concern Present  . Feeling of Stress : Not at all  Social Connections: Slightly Isolated  . Frequency of Communication with Friends and  Family: More than three times a week  . Frequency of Social Gatherings with Friends and Family: More than three times a week  . Attends Religious Services: More than 4 times per year  . Active Member of Clubs or Organizations: Yes  . Attends Archivist Meetings: More than 4 times per year  . Marital Status: Widowed  Intimate Partner Violence: Not At Risk  . Fear of Current or Ex-Partner: No  . Emotionally Abused: No  . Physically Abused: No  . Sexually Abused: No    Outpatient Encounter Medications as of  03/29/2019  Medication Sig  . aspirin 81 MG EC tablet Take 81 mg by mouth daily.    Marland Kitchen ezetimibe-simvastatin (VYTORIN) 10-20 MG tablet Take 1 tablet by mouth daily.  . fluticasone (FLONASE) 50 MCG/ACT nasal spray USE 2 SPRAYS IN EACH NOSTRIL DAILY  . levothyroxine (SYNTHROID) 100 MCG tablet daily (Patient taking differently: Take 100 mcg by mouth daily. daily)  . levothyroxine (SYNTHROID) 25 MCG tablet Take 0.5 tab on Mon, Wed and Fri mornings before breakfast.  . omeprazole (PRILOSEC) 20 MG capsule Take 2 caps po daily  . ranolazine (RANEXA) 500 MG 12 hr tablet Take 1 tablet (500 mg total) by mouth 2 (two) times daily.  . Vitamin D, Ergocalciferol, (DRISDOL) 1.25 MG (50000 UT) CAPS capsule TAKE 1 CAPSULE EVERY WEEK  . nitroGLYCERIN (NITROSTAT) 0.4 MG SL tablet Place 0.4 mg under the tongue every 5 (five) minutes as needed. Reported on 06/05/2015  . [DISCONTINUED] polyethylene glycol powder (GLYCOLAX/MIRALAX) 17 GM/SCOOP powder Take 17 g by mouth 2 (two) times daily as needed. (Patient not taking: Reported on 03/29/2019)   No facility-administered encounter medications on file as of 03/29/2019.    Allergies  Allergen Reactions  . Imdur [Isosorbide Nitrate]     Headache   . Lipitor [Atorvastatin] Other (See Comments)    myalgias    Review of Systems  Constitutional: Negative for activity change, appetite change, chills, fatigue and fever.  HENT: Negative.   Eyes: Negative.   Respiratory: Negative for cough, chest tightness and shortness of breath.   Cardiovascular: Negative for chest pain, palpitations and leg swelling.  Gastrointestinal: Negative for blood in stool, constipation, diarrhea, nausea and vomiting.  Endocrine: Negative.   Genitourinary: Negative for dysuria, frequency and urgency.  Musculoskeletal: Negative for arthralgias and myalgias.  Skin: Negative.   Allergic/Immunologic: Negative.   Neurological: Negative for dizziness and headaches.  Hematological: Negative.     Psychiatric/Behavioral: Negative for confusion, hallucinations, sleep disturbance and suicidal ideas.  All other systems reviewed and are negative.       Objective:  BP 127/68 (BP Location: Left Arm, Cuff Size: Normal)   Pulse (!) 58   Temp 98 F (36.7 C)   Resp 20   Ht 6' 3" (1.905 m)   Wt 216 lb (98 kg)   SpO2 97%   BMI 27.00 kg/m    Wt Readings from Last 3 Encounters:  03/29/19 216 lb (98 kg)  02/16/19 216 lb (98 kg)  12/22/18 213 lb (96.6 kg)    Physical Exam Vitals and nursing note reviewed.  Constitutional:      General: He is not in acute distress.    Appearance: Normal appearance. He is well-developed and well-groomed. He is not ill-appearing, toxic-appearing or diaphoretic.  HENT:     Head: Normocephalic and atraumatic.     Jaw: There is normal jaw occlusion.     Right Ear: Hearing normal.     Left  Ear: Hearing normal.     Nose: Nose normal.     Mouth/Throat:     Lips: Pink.     Mouth: Mucous membranes are moist.     Pharynx: Oropharynx is clear. Uvula midline.  Eyes:     General: Lids are normal.     Extraocular Movements: Extraocular movements intact.     Conjunctiva/sclera: Conjunctivae normal.     Pupils: Pupils are equal, round, and reactive to light.  Neck:     Thyroid: No thyroid mass, thyromegaly or thyroid tenderness.     Vascular: No carotid bruit or JVD.     Trachea: Trachea and phonation normal.  Cardiovascular:     Rate and Rhythm: Normal rate and regular rhythm.     Chest Wall: PMI is not displaced.     Pulses: Normal pulses.     Heart sounds: Normal heart sounds. No murmur. No friction rub. No gallop.   Pulmonary:     Effort: Pulmonary effort is normal. No respiratory distress.     Breath sounds: Normal breath sounds. No wheezing.  Abdominal:     General: Bowel sounds are normal. There is no distension or abdominal bruit.     Palpations: Abdomen is soft. There is no hepatomegaly or splenomegaly.     Tenderness: There is no abdominal  tenderness. There is no right CVA tenderness or left CVA tenderness.     Hernia: No hernia is present.  Musculoskeletal:        General: Normal range of motion.     Cervical back: Normal range of motion and neck supple.     Right lower leg: No edema.     Left lower leg: No edema.  Lymphadenopathy:     Cervical: No cervical adenopathy.  Skin:    General: Skin is warm and dry.     Capillary Refill: Capillary refill takes less than 2 seconds.     Coloration: Skin is not cyanotic, jaundiced or pale.     Findings: No rash.  Neurological:     General: No focal deficit present.     Mental Status: He is alert and oriented to person, place, and time.     Cranial Nerves: Cranial nerves are intact.     Sensory: Sensation is intact.     Motor: Motor function is intact.     Coordination: Coordination is intact.     Gait: Gait is intact.     Deep Tendon Reflexes: Reflexes are normal and symmetric.  Psychiatric:        Attention and Perception: Attention and perception normal.        Mood and Affect: Mood and affect normal.        Speech: Speech normal.        Behavior: Behavior normal. Behavior is cooperative.        Thought Content: Thought content normal.        Cognition and Memory: Cognition and memory normal.        Judgment: Judgment normal.     Results for orders placed or performed in visit on 02/25/19  Microscopic Examination   URINE  Result Value Ref Range   WBC, UA None seen 0 - 5 /hpf   RBC 0-2 0 - 2 /hpf   Epithelial Cells (non renal) None seen 0 - 10 /hpf   Renal Epithel, UA None seen None seen /hpf   Bacteria, UA None seen None seen/Few  CBC with Differential/Platelet  Result Value Ref Range  WBC 9.8 3.4 - 10.8 x10E3/uL   RBC 4.71 4.14 - 5.80 x10E6/uL   Hemoglobin 14.0 13.0 - 17.7 g/dL   Hematocrit 40.7 37.5 - 51.0 %   MCV 86 79 - 97 fL   MCH 29.7 26.6 - 33.0 pg   MCHC 34.4 31.5 - 35.7 g/dL   RDW 14.2 11.6 - 15.4 %   Platelets 307 150 - 450 x10E3/uL    Neutrophils 65 Not Estab. %   Lymphs 22 Not Estab. %   Monocytes 8 Not Estab. %   Eos 3 Not Estab. %   Basos 1 Not Estab. %   Neutrophils Absolute 6.5 1.4 - 7.0 x10E3/uL   Lymphocytes Absolute 2.2 0.7 - 3.1 x10E3/uL   Monocytes Absolute 0.8 0.1 - 0.9 x10E3/uL   EOS (ABSOLUTE) 0.3 0.0 - 0.4 x10E3/uL   Basophils Absolute 0.1 0.0 - 0.2 x10E3/uL   Immature Granulocytes 1 Not Estab. %   Immature Grans (Abs) 0.1 0.0 - 0.1 x10E3/uL  CMP14+EGFR  Result Value Ref Range   Glucose 97 65 - 99 mg/dL   BUN 11 8 - 27 mg/dL   Creatinine, Ser 1.14 0.76 - 1.27 mg/dL   GFR calc non Af Amer 65 >59 mL/min/1.73   GFR calc Af Amer 75 >59 mL/min/1.73   BUN/Creatinine Ratio 10 10 - 24   Sodium 136 134 - 144 mmol/L   Potassium 4.3 3.5 - 5.2 mmol/L   Chloride 96 96 - 106 mmol/L   CO2 25 20 - 29 mmol/L   Calcium 9.6 8.6 - 10.2 mg/dL   Total Protein 7.8 6.0 - 8.5 g/dL   Albumin 4.7 3.8 - 4.8 g/dL   Globulin, Total 3.1 1.5 - 4.5 g/dL   Albumin/Globulin Ratio 1.5 1.2 - 2.2   Bilirubin Total 0.8 0.0 - 1.2 mg/dL   Alkaline Phosphatase 90 39 - 117 IU/L   AST 21 0 - 40 IU/L   ALT 32 0 - 44 IU/L  urinalysis- dip and micro  Result Value Ref Range   Specific Gravity, UA 1.015 1.005 - 1.030   pH, UA 6.0 5.0 - 7.5   Color, UA Yellow Yellow   Appearance Ur Clear Clear   Leukocytes,UA Negative Negative   Protein,UA Negative Negative/Trace   Glucose, UA Negative Negative   Ketones, UA Negative Negative   RBC, UA Trace (A) Negative   Bilirubin, UA Negative Negative   Urobilinogen, Ur 0.2 0.2 - 1.0 mg/dL   Nitrite, UA Negative Negative   Microscopic Examination See below:        Pertinent labs & imaging results that were available during my care of the patient were reviewed by me and considered in my medical decision making.  Assessment & Plan:  [name] was seen today for medical management of chronic issues, hyperlipidemia and hypothyroidism.  Diagnoses and all orders for this visit:  Aortic  atherosclerosis ([place]) Continue ASA and statin therapy -     CMP14+EGFR -     Lipid panel  Coronary artery disease involving native coronary artery of native heart with angina pectoris with documented spasm (HCC) -     CMP14+EGFR -     Lipid panel Continue current medication regimen.   Acquired hypothyroidism -     Thyroid Panel With TSH Continue current medication regimen if labs WNL.   Vitamin D deficiency -     VITAMIN D 25 Hydroxy (Vit-D Deficiency, Fractures) Continue current therapy.   Mixed hyperlipidemia -     Lipid panel Discussed importance  of diet low in fried/fatty/fast foods, high in health proteins, fruits and vegetables. Discussed importance of increasing physical activity and associated risk factors.     Continue all other maintenance medications.  Follow up plan: Return in about 6 months (around 09/26/2019), or if symptoms worsen or fail to improve.  Continue healthy lifestyle choices, including diet (rich in fruits, vegetables, and lean proteins, and low in salt and simple carbohydrates) and exercise (at least 30 minutes of moderate physical activity daily).  Educational handout given for low cholesterol diet.   The above assessment and management plan was discussed with the patient. The patient verbalized understanding of and has agreed to the management plan. Patient is aware to call the clinic if they develop any new symptoms or if symptoms persist or worsen. Patient is aware when to return to the clinic for a follow-up visit. Patient educated on when it is appropriate to go to the emergency department.   Monia Pouch, FNP-C [facility] [phone]

## 2019-03-29 NOTE — Patient Instructions (Signed)

## 2019-03-30 ENCOUNTER — Telehealth: Payer: Self-pay | Admitting: Family Medicine

## 2019-03-30 LAB — LIPID PANEL
Chol/HDL Ratio: 6.3 ratio — ABNORMAL HIGH (ref 0.0–5.0)
Cholesterol, Total: 220 mg/dL — ABNORMAL HIGH (ref 100–199)
HDL: 35 mg/dL — ABNORMAL LOW (ref >39)
LDL Chol Calc (NIH): 67 mg/dL (ref 0–99)
Triglycerides: 771 mg/dL (ref 0–149)
VLDL Cholesterol Cal: 118 mg/dL — ABNORMAL HIGH (ref 5–40)

## 2019-03-30 LAB — CMP14+EGFR
ALT: 37 IU/L (ref 0–44)
AST: 24 IU/L (ref 0–40)
Albumin/Globulin Ratio: 1.7 (ref 1.2–2.2)
Albumin: 4.7 g/dL (ref 3.8–4.8)
Alkaline Phosphatase: 72 IU/L (ref 39–117)
BUN/Creatinine Ratio: 16 (ref 10–24)
BUN: 16 mg/dL (ref 8–27)
Bilirubin Total: 0.6 mg/dL (ref 0.0–1.2)
CO2: 24 mmol/L (ref 20–29)
Calcium: 9.5 mg/dL (ref 8.6–10.2)
Chloride: 100 mmol/L (ref 96–106)
Creatinine, Ser: 1.01 mg/dL (ref 0.76–1.27)
GFR calc Af Amer: 87 mL/min/{1.73_m2} (ref >59)
GFR calc non Af Amer: 75 mL/min/{1.73_m2} (ref >59)
Globulin, Total: 2.7 g/dL (ref 1.5–4.5)
Glucose: 88 mg/dL (ref 65–99)
Potassium: 4.4 mmol/L (ref 3.5–5.2)
Sodium: 140 mmol/L (ref 134–144)
Total Protein: 7.4 g/dL (ref 6.0–8.5)

## 2019-03-30 LAB — VITAMIN D 25 HYDROXY (VIT D DEFICIENCY, FRACTURES): Vit D, 25-Hydroxy: 34.1 ng/mL (ref 30.0–100.0)

## 2019-03-30 LAB — THYROID PANEL WITH TSH
Free Thyroxine Index: 1.9 (ref 1.2–4.9)
T3 Uptake Ratio: 24 % (ref 24–39)
T4, Total: 7.8 ug/dL (ref 4.5–12.0)
TSH: 2.41 u[IU]/mL (ref 0.450–4.500)

## 2019-03-30 NOTE — Telephone Encounter (Signed)
Pt called back stating that he received a missed call from [place] regarding lab results. Went over lab results with pt per Dr Thayer Ohm notes. Pt voiced understanding and confirmed that he is taking his medications as directed and is working on his diet. Also said that he picked up the omega 3 and red yeast rice.

## 2019-04-11 DIAGNOSIS — M9901 Segmental and somatic dysfunction of cervical region: Secondary | ICD-10-CM | POA: Diagnosis not present

## 2019-04-11 DIAGNOSIS — M9902 Segmental and somatic dysfunction of thoracic region: Secondary | ICD-10-CM | POA: Diagnosis not present

## 2019-04-11 DIAGNOSIS — M47812 Spondylosis without myelopathy or radiculopathy, cervical region: Secondary | ICD-10-CM | POA: Diagnosis not present

## 2019-04-14 DIAGNOSIS — M9901 Segmental and somatic dysfunction of cervical region: Secondary | ICD-10-CM | POA: Diagnosis not present

## 2019-04-14 DIAGNOSIS — M47812 Spondylosis without myelopathy or radiculopathy, cervical region: Secondary | ICD-10-CM | POA: Diagnosis not present

## 2019-04-14 DIAGNOSIS — M9902 Segmental and somatic dysfunction of thoracic region: Secondary | ICD-10-CM | POA: Diagnosis not present

## 2019-04-28 DIAGNOSIS — M47812 Spondylosis without myelopathy or radiculopathy, cervical region: Secondary | ICD-10-CM | POA: Diagnosis not present

## 2019-04-28 DIAGNOSIS — M9902 Segmental and somatic dysfunction of thoracic region: Secondary | ICD-10-CM | POA: Diagnosis not present

## 2019-04-28 DIAGNOSIS — M9901 Segmental and somatic dysfunction of cervical region: Secondary | ICD-10-CM | POA: Diagnosis not present

## 2019-05-03 ENCOUNTER — Other Ambulatory Visit: Payer: Self-pay | Admitting: Family Medicine

## 2019-05-03 DIAGNOSIS — K5900 Constipation, unspecified: Secondary | ICD-10-CM

## 2019-05-04 DIAGNOSIS — M47812 Spondylosis without myelopathy or radiculopathy, cervical region: Secondary | ICD-10-CM | POA: Diagnosis not present

## 2019-05-04 DIAGNOSIS — M9901 Segmental and somatic dysfunction of cervical region: Secondary | ICD-10-CM | POA: Diagnosis not present

## 2019-05-04 DIAGNOSIS — M9902 Segmental and somatic dysfunction of thoracic region: Secondary | ICD-10-CM | POA: Diagnosis not present

## 2019-05-18 DIAGNOSIS — M47812 Spondylosis without myelopathy or radiculopathy, cervical region: Secondary | ICD-10-CM | POA: Diagnosis not present

## 2019-05-18 DIAGNOSIS — M9902 Segmental and somatic dysfunction of thoracic region: Secondary | ICD-10-CM | POA: Diagnosis not present

## 2019-05-18 DIAGNOSIS — M9901 Segmental and somatic dysfunction of cervical region: Secondary | ICD-10-CM | POA: Diagnosis not present

## 2019-05-27 ENCOUNTER — Encounter: Payer: Self-pay | Admitting: *Deleted

## 2019-06-01 DIAGNOSIS — M9901 Segmental and somatic dysfunction of cervical region: Secondary | ICD-10-CM | POA: Diagnosis not present

## 2019-06-01 DIAGNOSIS — M47812 Spondylosis without myelopathy or radiculopathy, cervical region: Secondary | ICD-10-CM | POA: Diagnosis not present

## 2019-06-01 DIAGNOSIS — M9902 Segmental and somatic dysfunction of thoracic region: Secondary | ICD-10-CM | POA: Diagnosis not present

## 2019-06-15 DIAGNOSIS — M9903 Segmental and somatic dysfunction of lumbar region: Secondary | ICD-10-CM | POA: Diagnosis not present

## 2019-06-15 DIAGNOSIS — M9901 Segmental and somatic dysfunction of cervical region: Secondary | ICD-10-CM | POA: Diagnosis not present

## 2019-06-15 DIAGNOSIS — M9902 Segmental and somatic dysfunction of thoracic region: Secondary | ICD-10-CM | POA: Diagnosis not present

## 2019-06-15 DIAGNOSIS — M47812 Spondylosis without myelopathy or radiculopathy, cervical region: Secondary | ICD-10-CM | POA: Diagnosis not present

## 2019-06-15 DIAGNOSIS — M4004 Postural kyphosis, thoracic region: Secondary | ICD-10-CM | POA: Diagnosis not present

## 2019-06-15 DIAGNOSIS — S338XXA Sprain of other parts of lumbar spine and pelvis, initial encounter: Secondary | ICD-10-CM | POA: Diagnosis not present

## 2019-06-29 DIAGNOSIS — S338XXA Sprain of other parts of lumbar spine and pelvis, initial encounter: Secondary | ICD-10-CM | POA: Diagnosis not present

## 2019-06-29 DIAGNOSIS — M47812 Spondylosis without myelopathy or radiculopathy, cervical region: Secondary | ICD-10-CM | POA: Diagnosis not present

## 2019-06-29 DIAGNOSIS — M4004 Postural kyphosis, thoracic region: Secondary | ICD-10-CM | POA: Diagnosis not present

## 2019-06-29 DIAGNOSIS — M9903 Segmental and somatic dysfunction of lumbar region: Secondary | ICD-10-CM | POA: Diagnosis not present

## 2019-06-29 DIAGNOSIS — M9901 Segmental and somatic dysfunction of cervical region: Secondary | ICD-10-CM | POA: Diagnosis not present

## 2019-06-29 DIAGNOSIS — M9902 Segmental and somatic dysfunction of thoracic region: Secondary | ICD-10-CM | POA: Diagnosis not present

## 2019-07-13 ENCOUNTER — Other Ambulatory Visit: Payer: Self-pay

## 2019-07-13 DIAGNOSIS — M47812 Spondylosis without myelopathy or radiculopathy, cervical region: Secondary | ICD-10-CM | POA: Diagnosis not present

## 2019-07-13 DIAGNOSIS — S338XXA Sprain of other parts of lumbar spine and pelvis, initial encounter: Secondary | ICD-10-CM | POA: Diagnosis not present

## 2019-07-13 DIAGNOSIS — M9903 Segmental and somatic dysfunction of lumbar region: Secondary | ICD-10-CM | POA: Diagnosis not present

## 2019-07-13 DIAGNOSIS — M9902 Segmental and somatic dysfunction of thoracic region: Secondary | ICD-10-CM | POA: Diagnosis not present

## 2019-07-13 DIAGNOSIS — E559 Vitamin D deficiency, unspecified: Secondary | ICD-10-CM

## 2019-07-13 DIAGNOSIS — M4004 Postural kyphosis, thoracic region: Secondary | ICD-10-CM | POA: Diagnosis not present

## 2019-07-13 DIAGNOSIS — M9901 Segmental and somatic dysfunction of cervical region: Secondary | ICD-10-CM | POA: Diagnosis not present

## 2019-07-13 MED ORDER — VITAMIN D (ERGOCALCIFEROL) 1.25 MG (50000 UNIT) PO CAPS: ORAL_CAPSULE | ORAL | 0 refills | Status: DC

## 2019-07-26 ENCOUNTER — Other Ambulatory Visit: Payer: Self-pay | Admitting: *Deleted

## 2019-07-26 DIAGNOSIS — E782 Mixed hyperlipidemia: Secondary | ICD-10-CM

## 2019-07-26 MED ORDER — EZETIMIBE-SIMVASTATIN 10-20 MG PO TABS: 1.0000 | ORAL_TABLET | Freq: Every day | ORAL | 2 refills | Status: DC

## 2019-07-27 DIAGNOSIS — M47812 Spondylosis without myelopathy or radiculopathy, cervical region: Secondary | ICD-10-CM | POA: Diagnosis not present

## 2019-07-27 DIAGNOSIS — M9902 Segmental and somatic dysfunction of thoracic region: Secondary | ICD-10-CM | POA: Diagnosis not present

## 2019-07-27 DIAGNOSIS — M9901 Segmental and somatic dysfunction of cervical region: Secondary | ICD-10-CM | POA: Diagnosis not present

## 2019-07-27 DIAGNOSIS — M9903 Segmental and somatic dysfunction of lumbar region: Secondary | ICD-10-CM | POA: Diagnosis not present

## 2019-07-27 DIAGNOSIS — M4004 Postural kyphosis, thoracic region: Secondary | ICD-10-CM | POA: Diagnosis not present

## 2019-07-27 DIAGNOSIS — S338XXA Sprain of other parts of lumbar spine and pelvis, initial encounter: Secondary | ICD-10-CM | POA: Diagnosis not present

## 2019-08-10 DIAGNOSIS — S338XXA Sprain of other parts of lumbar spine and pelvis, initial encounter: Secondary | ICD-10-CM | POA: Diagnosis not present

## 2019-08-10 DIAGNOSIS — M9902 Segmental and somatic dysfunction of thoracic region: Secondary | ICD-10-CM | POA: Diagnosis not present

## 2019-08-10 DIAGNOSIS — M9903 Segmental and somatic dysfunction of lumbar region: Secondary | ICD-10-CM | POA: Diagnosis not present

## 2019-08-10 DIAGNOSIS — M47812 Spondylosis without myelopathy or radiculopathy, cervical region: Secondary | ICD-10-CM | POA: Diagnosis not present

## 2019-08-10 DIAGNOSIS — M9901 Segmental and somatic dysfunction of cervical region: Secondary | ICD-10-CM | POA: Diagnosis not present

## 2019-08-10 DIAGNOSIS — M4004 Postural kyphosis, thoracic region: Secondary | ICD-10-CM | POA: Diagnosis not present

## 2019-08-25 DIAGNOSIS — R69 Illness, unspecified: Secondary | ICD-10-CM | POA: Diagnosis not present

## 2019-09-05 ENCOUNTER — Other Ambulatory Visit: Payer: Self-pay | Admitting: *Deleted

## 2019-09-05 DIAGNOSIS — I2510 Atherosclerotic heart disease of native coronary artery without angina pectoris: Secondary | ICD-10-CM

## 2019-09-05 MED ORDER — RANOLAZINE ER 500 MG PO TB12: 500.0000 mg | ORAL_TABLET | Freq: Two times a day (BID) | ORAL | 0 refills | Status: DC

## 2019-09-20 DIAGNOSIS — R69 Illness, unspecified: Secondary | ICD-10-CM | POA: Diagnosis not present

## 2019-09-26 ENCOUNTER — Ambulatory Visit: Payer: Medicare HMO | Admitting: Family Medicine

## 2019-09-26 ENCOUNTER — Ambulatory Visit: Payer: Medicare HMO | Admitting: Family

## 2019-09-27 ENCOUNTER — Other Ambulatory Visit: Payer: Self-pay

## 2019-09-27 ENCOUNTER — Ambulatory Visit (INDEPENDENT_AMBULATORY_CARE_PROVIDER_SITE_OTHER): Payer: Medicare HMO | Admitting: Family

## 2019-09-27 ENCOUNTER — Encounter: Payer: Self-pay | Admitting: Family

## 2019-09-27 VITALS — BP 128/77 | HR 67 | Temp 98.2°F | Ht 75.0 in | Wt 215.6 lb

## 2019-09-27 DIAGNOSIS — E559 Vitamin D deficiency, unspecified: Secondary | ICD-10-CM

## 2019-09-27 DIAGNOSIS — G4733 Obstructive sleep apnea (adult) (pediatric): Secondary | ICD-10-CM

## 2019-09-27 DIAGNOSIS — I25111 Atherosclerotic heart disease of native coronary artery with angina pectoris with documented spasm: Secondary | ICD-10-CM | POA: Diagnosis not present

## 2019-09-27 DIAGNOSIS — I2510 Atherosclerotic heart disease of native coronary artery without angina pectoris: Secondary | ICD-10-CM

## 2019-09-27 DIAGNOSIS — K219 Gastro-esophageal reflux disease without esophagitis: Secondary | ICD-10-CM | POA: Diagnosis not present

## 2019-09-27 DIAGNOSIS — I700 Atherosclerosis of aorta: Secondary | ICD-10-CM

## 2019-09-27 DIAGNOSIS — R067 Sneezing: Secondary | ICD-10-CM

## 2019-09-27 DIAGNOSIS — E039 Hypothyroidism, unspecified: Secondary | ICD-10-CM

## 2019-09-27 DIAGNOSIS — Z1211 Encounter for screening for malignant neoplasm of colon: Secondary | ICD-10-CM

## 2019-09-27 DIAGNOSIS — E785 Hyperlipidemia, unspecified: Secondary | ICD-10-CM

## 2019-09-27 DIAGNOSIS — E782 Mixed hyperlipidemia: Secondary | ICD-10-CM | POA: Diagnosis not present

## 2019-09-27 DIAGNOSIS — I2583 Coronary atherosclerosis due to lipid rich plaque: Secondary | ICD-10-CM | POA: Diagnosis not present

## 2019-09-27 DIAGNOSIS — K429 Umbilical hernia without obstruction or gangrene: Secondary | ICD-10-CM

## 2019-09-27 MED ORDER — LEVOTHYROXINE SODIUM 100 MCG PO TABS: 100.0000 ug | ORAL_TABLET | Freq: Every day | ORAL | 3 refills | Status: DC

## 2019-09-27 MED ORDER — EZETIMIBE-SIMVASTATIN 10-20 MG PO TABS: 1.0000 | ORAL_TABLET | Freq: Every day | ORAL | 2 refills | Status: DC

## 2019-09-27 MED ORDER — OMEPRAZOLE 20 MG PO CPDR: DELAYED_RELEASE_CAPSULE | ORAL | 3 refills | Status: DC

## 2019-09-27 MED ORDER — VITAMIN D (ERGOCALCIFEROL) 1.25 MG (50000 UNIT) PO CAPS: ORAL_CAPSULE | ORAL | 0 refills | Status: DC

## 2019-09-27 MED ORDER — LEVOTHYROXINE SODIUM 25 MCG PO TABS: ORAL_TABLET | ORAL | 3 refills | Status: DC

## 2019-09-27 MED ORDER — FLUTICASONE PROPIONATE 50 MCG/ACT NA SUSP: 2.0000 | Freq: Every day | NASAL | 11 refills | Status: DC

## 2019-09-27 NOTE — Progress Notes (Addendum)
Subjective:    Patient ID: Corey Ochs., male    DOB: 12/25/1950, 69 y.o.   MRN: [id number]  Chief Complaint  Patient presents with  . Medical Management of Chronic Issues    rakes patient    Pt presents to the office today to establish care. He is followed by Cardiologists annually for CAD.  Gastroesophageal Reflux He complains of belching and heartburn. This is a chronic problem. The current episode started more than 1 year ago. The problem occurs occasionally. The problem has been waxing and waning. Risk factors include obesity. He has tried a PPI for the symptoms.  Thyroid Problem Presents for follow-up visit. Patient reports no anxiety, constipation or dry skin. The symptoms have been stable. His past medical history is significant for hyperlipidemia.  Hyperlipidemia This is a chronic problem. The current episode started more than 1 year ago. The problem is uncontrolled. Recent lipid tests were reviewed and are high. Exacerbating diseases include obesity. Current antihyperlipidemic treatment includes statins and fibric acid derivatives. The current treatment provides moderate improvement of lipids.   [facility] PT does not sleep with CPAP.    Review of Systems  Gastrointestinal: Positive for heartburn. Negative for constipation.  Psychiatric/Behavioral: The patient is not nervous/anxious.   All other systems reviewed and are negative.      Objective:   Physical Exam Vitals reviewed.  Constitutional:      General: He is not in acute distress.    Appearance: He is well-developed. He is obese.  HENT:     Head: Normocephalic.     Right Ear: Tympanic membrane normal.     Left Ear: Tympanic membrane normal.  Eyes:     General:        Right eye: No discharge.        Left eye: No discharge.     Pupils: Pupils are equal, round, and reactive to light.  Neck:     Thyroid: No thyromegaly.  Cardiovascular:     Rate and Rhythm: Normal rate and regular rhythm.     Heart  sounds: Normal heart sounds. No murmur heard.   Pulmonary:     Effort: Pulmonary effort is normal. No respiratory distress.     Breath sounds: Normal breath sounds. No wheezing.  Abdominal:     General: Bowel sounds are normal. There is no distension.     Palpations: Abdomen is soft.     Tenderness: There is no abdominal tenderness.     Hernia: A hernia is present.  Musculoskeletal:        General: No tenderness. Normal range of motion.     Cervical back: Normal range of motion and neck supple.  Skin:    General: Skin is warm and dry.     Findings: No erythema or rash.  Neurological:     Mental Status: He is alert and oriented to person, place, and time.     Cranial Nerves: No cranial nerve deficit.     Deep Tendon Reflexes: Reflexes are normal and symmetric.  Psychiatric:        Behavior: Behavior normal.        Thought Content: Thought content normal.        Judgment: Judgment normal.     BP 128/77   Pulse 67   Temp 98.2 F (36.8 C) (Temporal)   Ht 6' 3" (1.905 m)   Wt 215 lb 9.6 oz (97.8 kg)   SpO2 92%   BMI 26.95 kg/m  Assessment & Plan:  [name]. comes in today with chief complaint of Medical Management of Chronic Issues (rakes patient )   Diagnosis and orders addressed:  1. Acquired hypothyroidism - levothyroxine (SYNTHROID) 25 MCG tablet; Take 0.5 tab on Mon, Wed and Fri mornings before breakfast.  Dispense: 36 tablet; Refill: 3 - levothyroxine (SYNTHROID) 100 MCG tablet; Take 1 tablet (100 mcg total) by mouth daily. daily  Dispense: 90 tablet; Refill: 3 - CMP14+EGFR - CBC with Differential/Platelet  2. Gastroesophageal reflux disease without esophagitis - omeprazole (PRILOSEC) 20 MG capsule; Take 2 caps po daily  Dispense: 180 capsule; Refill: 3 - CMP14+EGFR - CBC with Differential/Platelet  3. Mixed hyperlipidemia - ezetimibe-simvastatin (VYTORIN) 10-20 MG tablet; Take 1 tablet by mouth daily.  Dispense: 30 tablet; Refill: 2 -  CMP14+EGFR - CBC with Differential/Platelet  4. Sneezing - fluticasone (FLONASE) 50 MCG/ACT nasal spray; Place 2 sprays into both nostrils daily.  Dispense: 16 g; Refill: 11 - CMP14+EGFR - CBC with Differential/Platelet  5. Vitamin D deficiency - Vitamin D, Ergocalciferol, (DRISDOL) 1.25 MG (50000 UNIT) CAPS capsule; TAKE 1 CAPSULE EVERY WEEK  Dispense: 12 capsule; Refill: 0 - CMP14+EGFR - CBC with Differential/Platelet  6. Aortic atherosclerosis (HCC) - CMP14+EGFR - CBC with Differential/Platelet  7. Coronary artery disease due to lipid rich plaque - CMP14+EGFR - CBC with Differential/Platelet  8. Dyslipidemia - CMP14+EGFR - CBC with Differential/Platelet  9. Obstructive sleep apnea - CMP14+EGFR - CBC with Differential/Platelet  10. Coronary artery disease involving native coronary artery of native heart with angina pectoris with documented spasm (HCC) - CMP14+EGFR - CBC with Differential/Platelet  11. Umbilical hernia without obstruction and without gangrene - Ambulatory referral to General Surgery - CMP14+EGFR - CBC with Differential/Platelet  12. Colon cancer screening - Ambulatory referral to Gastroenterology - CMP14+EGFR - CBC with Differential/Platelet   Labs pending Health Maintenance reviewed Diet and exercise encouraged  Follow up plan: 6 months   Evelina Dun, FNP

## 2019-09-27 NOTE — Patient Instructions (Signed)
Umbilical Hernia, Adult  A hernia is a bulge of tissue that pushes through an opening between muscles. An umbilical hernia happens in the abdomen, near the belly button (umbilicus). The hernia may contain tissues from the small intestine, large intestine, or fatty tissue covering the intestines (omentum). Umbilical hernias in adults tend to get worse over time, and they require surgical treatment. There are several types of umbilical hernias. You may have:  A hernia located just above or below the umbilicus (indirect hernia). This is the most common type of umbilical hernia in adults.  A hernia that forms through an opening formed by the umbilicus (direct hernia).  A hernia that comes and goes (reducible hernia). A reducible hernia may be visible only when you strain, lift something heavy, or cough. This type of hernia can be pushed back into the abdomen (reduced).  A hernia that traps abdominal tissue inside the hernia (incarcerated hernia). This type of hernia cannot be reduced.  A hernia that cuts off blood flow to the tissues inside the hernia (strangulated hernia). The tissues can start to die if this happens. This type of hernia requires emergency treatment. What are the causes? An umbilical hernia happens when tissue inside the abdomen presses on a weak area of the abdominal muscles. What increases the risk? You may have a greater risk of this condition if you:  Are obese.  Have had several pregnancies.  Have a buildup of fluid inside your abdomen (ascites).  Have had surgery that weakens the abdominal muscles. What are the signs or symptoms? The main symptom of this condition is a painless bulge at or near the belly button. A reducible hernia may be visible only when you strain, lift something heavy, or cough. Other symptoms may include:  Dull pain.  A feeling of pressure. Symptoms of a strangulated hernia may include:  Pain that gets increasingly worse.  Nausea and  vomiting.  Pain when pressing on the hernia.  Skin over the hernia becoming red or purple.  Constipation.  Blood in the stool. How is this diagnosed? This condition may be diagnosed based on:  A physical exam. You may be asked to cough or strain while standing. These actions increase the pressure inside your abdomen and force the hernia through the opening in your muscles. Your health care provider may try to reduce the hernia by pressing on it.  Your symptoms and medical history. How is this treated? Surgery is the only treatment for an umbilical hernia. Surgery for a strangulated hernia is done as soon as possible. If you have a small hernia that is not incarcerated, you may need to lose weight before having surgery. Follow these instructions at home:  Lose weight, if told by your health care provider.  Do not try to push the hernia back in.  Watch your hernia for any changes in color or size. Tell your health care provider if any changes occur.  You may need to avoid activities that increase pressure on your hernia.  Do not lift anything that is heavier than 10 lb (4.5 kg) until your health care provider says that this is safe.  Take over-the-counter and prescription medicines only as told by your health care provider.  Keep all follow-up visits as told by your health care provider. This is important. Contact a health care provider if:  Your hernia gets larger.  Your hernia becomes painful. Get help right away if:  You develop sudden, severe pain near the area of your hernia.    You have pain as well as nausea or vomiting.  You have pain and the skin over your hernia changes color.  You develop a fever. This information is not intended to replace advice given to you by your health care provider. Make sure you discuss any questions you have with your health care provider. Document Revised: 03/11/2017 Document Reviewed: 07/28/2016 Elsevier Patient Education  2020  Elsevier Inc.  

## 2019-09-28 LAB — CMP14+EGFR
ALT: 29 IU/L (ref 0–44)
AST: 26 IU/L (ref 0–40)
Albumin/Globulin Ratio: 1.6 (ref 1.2–2.2)
Albumin: 4.7 g/dL (ref 3.8–4.8)
Alkaline Phosphatase: 88 IU/L (ref 48–121)
BUN/Creatinine Ratio: 14 (ref 10–24)
BUN: 11 mg/dL (ref 8–27)
Bilirubin Total: 0.4 mg/dL (ref 0.0–1.2)
CO2: 23 mmol/L (ref 20–29)
Calcium: 9.5 mg/dL (ref 8.6–10.2)
Chloride: 97 mmol/L (ref 96–106)
Creatinine, Ser: 0.8 mg/dL (ref 0.76–1.27)
GFR calc Af Amer: 105 mL/min/{1.73_m2} (ref >59)
GFR calc non Af Amer: 91 mL/min/{1.73_m2} (ref >59)
Globulin, Total: 3 g/dL (ref 1.5–4.5)
Glucose: 111 mg/dL — ABNORMAL HIGH (ref 65–99)
Potassium: 4.3 mmol/L (ref 3.5–5.2)
Sodium: 136 mmol/L (ref 134–144)
Total Protein: 7.7 g/dL (ref 6.0–8.5)

## 2019-09-28 LAB — CBC WITH DIFFERENTIAL/PLATELET
Basophils Absolute: 0 10*3/uL (ref 0.0–0.2)
Basos: 1 %
EOS (ABSOLUTE): 0.3 10*3/uL (ref 0.0–0.4)
Eos: 5 %
Hematocrit: 43 % (ref 37.5–51.0)
Hemoglobin: 14.4 g/dL (ref 13.0–17.7)
Immature Grans (Abs): 0 10*3/uL (ref 0.0–0.1)
Immature Granulocytes: 1 %
Lymphocytes Absolute: 2.1 10*3/uL (ref 0.7–3.1)
Lymphs: 32 %
MCH: 29.2 pg (ref 26.6–33.0)
MCHC: 33.5 g/dL (ref 31.5–35.7)
MCV: 87 fL (ref 79–97)
Monocytes Absolute: 0.5 10*3/uL (ref 0.1–0.9)
Monocytes: 7 %
Neutrophils Absolute: 3.5 10*3/uL (ref 1.4–7.0)
Neutrophils: 54 %
Platelets: 278 10*3/uL (ref 150–450)
RBC: 4.93 x10E6/uL (ref 4.14–5.80)
RDW: 14.3 % (ref 11.6–15.4)
WBC: 6.4 10*3/uL (ref 3.4–10.8)

## 2019-10-07 ENCOUNTER — Ambulatory Visit (INDEPENDENT_AMBULATORY_CARE_PROVIDER_SITE_OTHER): Payer: Medicare HMO | Admitting: *Deleted

## 2019-10-07 ENCOUNTER — Other Ambulatory Visit: Payer: Self-pay

## 2019-10-07 DIAGNOSIS — Z0000 Encounter for general adult medical examination without abnormal findings: Secondary | ICD-10-CM | POA: Diagnosis not present

## 2019-10-07 NOTE — Progress Notes (Signed)
MEDICARE ANNUAL WELLNESS VISIT  10/07/2019   Provider calling from [facility], patient participating in call from home.   Telephone Visit Disclaimer This Medicare AWV was conducted by telephone due to national recommendations for restrictions regarding the COVID-19 Pandemic (e.g. social distancing).  I verified, using two identifiers, that I am speaking with [name]. or their authorized healthcare agent. I discussed the limitations, risks, security, and privacy concerns of performing an evaluation and management service by telephone and the potential availability of an in-person appointment in the future. The patient expressed understanding and agreed to proceed.   Subjective:  [name]. is a 69 y.o. male patient of Hawks, Theador Hawthorne, FNP who had a Medicare Annual Wellness Visit today via telephone. [name] is widowed, retired, and lives alone. He has 3 grown children. He reports that he is socially active and does interact with friends/family regularly. He is minimally physically active and enjoys golfing.   Patient Care Team: Sharion Balloon, FNP as PCP - General (Family Medicine) Pyrtle, Lajuan Lines, MD as Consulting Physician (Gastroenterology) Laurin Coder, MD as Consulting Physician (Pulmonary Disease) Minus Breeding, MD as Consulting Physician (Cardiology)  Advanced Directives 10/07/2019 10/04/2018 09/28/2017  Does Patient Have a Medical Advance Directive? Yes No No  Type of Paramedic of [place];Living will;Out of facility DNR (pink MOST or yellow form) - -  Copy of [place] in Chart? No - copy requested - -  Would patient like information on creating a medical advance directive? - No - Patient declined Yes (MAU/Ambulatory/Procedural Areas - Information given)    Hospital Utilization Over the Past 12 Months: # of hospitalizations or ER visits: 0 # of surgeries: 0  Review of Systems    Patient reports that his  overall health is unchanged compared to last year.  History obtained from the patient and patient chart.   Patient Reported Readings (BP, Pulse, CBG, Weight, etc) none  Pain Assessment Pain : No/denies pain     Current Medications & Allergies (verified) Allergies as of 10/07/2019      Reactions   Imdur [isosorbide Nitrate]    Headache   Lipitor [atorvastatin] Other (See Comments)   myalgias      Medication List       Accurate as of October 07, 2019 11:09 AM. If you have any questions, ask your nurse or doctor.        aspirin 81 MG EC tablet Take 81 mg by mouth daily.   ezetimibe-simvastatin 10-20 MG tablet Commonly known as: VYTORIN Take 1 tablet by mouth daily.   fluticasone 50 MCG/ACT nasal spray Commonly known as: FLONASE Place 2 sprays into both nostrils daily.   levothyroxine 25 MCG tablet Commonly known as: SYNTHROID Take 0.5 tab on Mon, Wed and Fri mornings before breakfast.   levothyroxine 100 MCG tablet Commonly known as: SYNTHROID Take 1 tablet (100 mcg total) by mouth daily. daily   nitroGLYCERIN 0.4 MG SL tablet Commonly known as: NITROSTAT Place 0.4 mg under the tongue every 5 (five) minutes as needed. Reported on 06/05/2015   omeprazole 20 MG capsule Commonly known as: PRILOSEC Take 2 caps po daily   ranolazine 500 MG 12 hr tablet Commonly known as: Ranexa Take 1 tablet (500 mg total) by mouth 2 (two) times daily.   Vitamin D (Ergocalciferol) 1.25 MG (50000 UNIT) Caps capsule Commonly known as: DRISDOL TAKE 1 CAPSULE EVERY WEEK       History (  reviewed): Past Medical History:  Diagnosis Date  . Angina at rest [facility])   . Benign neoplasm of colon   . CAD (coronary artery disease)     Occluded RCA, 50% Diat 2006, 2008  . Diverticulosis of colon (without mention of hemorrhage)   . Esophageal reflux   . Other and unspecified hyperlipidemia   . Unspecified disorder of thyroid    Past Surgical History:  Procedure Laterality Date  .  TONSILLECTOMY  1955   Family History  Problem Relation Age of Onset  . Hyperlipidemia Father   . Heart disease Father        pacemaker / CHF   . Dementia Maternal Grandmother   . Cancer Maternal Grandfather        throat  . Alzheimer's disease Paternal Grandmother   . Cancer Paternal Grandfather        lung  . Cancer Sister        breast  . Colon cancer Neg Hx    Social History   Socioeconomic History  . Marital status: Widowed    Spouse name: Not on file  . Number of children: 3  . Years of education: 16  . Highest education level: Bachelor's degree (e.g., BA, AB, BS)  Occupational History  . Occupation: retired     Comment: golf / club prefessional   Tobacco Use  . Smoking status: Former Smoker    Packs/day: 0.50    Types: Cigarettes    Quit date: 07/22/1986    Years since quitting: 33.2  . Smokeless tobacco: Never Used  Vaping Use  . Vaping Use: Never used  Substance and Sexual Activity  . Alcohol use: Not Currently  . Drug use: No  . Sexual activity: Not Currently  Other Topics Concern  . Not on file  Social History Narrative   Retired, widowed, lives alone. 3 grown children. Enjoys golf.    Social Determinants of Health   Financial Resource Strain:   . Difficulty of Paying Living Expenses: Not on file  Food Insecurity:   . Worried About Charity fundraiser in the Last Year: Not on file  . Ran Out of Food in the Last Year: Not on file  Transportation Needs:   . Lack of Transportation (Medical): Not on file  . Lack of Transportation (Non-Medical): Not on file  Physical Activity:   . Days of Exercise per Week: Not on file  . Minutes of Exercise per Session: Not on file  Stress:   . Feeling of Stress : Not on file  Social Connections:   . Frequency of Communication with Friends and Family: Not on file  . Frequency of Social Gatherings with Friends and Family: Not on file  . Attends Religious Services: Not on file  . Active Member of Clubs or  Organizations: Not on file  . Attends Archivist Meetings: Not on file  . Marital Status: Not on file    Activities of Daily Living In your present state of health, do you have any difficulty performing the following activities: 10/07/2019  Hearing? N  Vision? N  Difficulty concentrating or making decisions? N  Walking or climbing stairs? N  Dressing or bathing? N  Doing errands, shopping? N  Preparing Food and eating ? N  Using the Toilet? N  In the past six months, have you accidently leaked urine? N  Do you have problems with loss of bowel control? N  Managing your Medications? N  Managing your Finances? N  Housekeeping or managing your Housekeeping? N  Some recent data might be hidden    Patient Education/ Literacy How often do you need to have someone help you when you read instructions, pamphlets, or other written materials from your doctor or pharmacy?: 1 - Never What is the last grade level you completed in school?: college  Exercise Current Exercise Habits: The patient does not participate in regular exercise at present  Diet Patient reports consuming 3 meals a day and 3 snack(s) a day Patient reports that his primary diet is: Regular Patient reports that she does have regular access to food.   Depression Screen PHQ 2/9 Scores 10/07/2019 09/27/2019 03/29/2019 12/22/2018 10/04/2018 09/15/2018 03/05/2018  PHQ - 2 Score 0 0 0 0 0 0 0     Fall Risk Fall Risk  10/07/2019 09/27/2019 03/29/2019 12/22/2018 10/04/2018  Falls in the past year? 0 0 0 0 0  Number falls in past yr: - - - - 0  Injury with Fall? - - - - 0     Objective:  [name]. seemed alert and oriented and he participated appropriately during our telephone visit.  Blood Pressure Weight BMI  BP Readings from Last 3 Encounters:  09/27/19 128/77  03/29/19 127/68  02/16/19 140/82   Wt Readings from Last 3 Encounters:  09/27/19 215 lb 9.6 oz (97.8 kg)  03/29/19 216 lb (98 kg)  02/16/19 216  lb (98 kg)   BMI Readings from Last 1 Encounters:  09/27/19 26.95 kg/m    *Unable to obtain current vital signs, weight, and BMI due to telephone visit type  Hearing/Vision  . Murad did not seem to have difficulty with hearing/understanding during the telephone conversation . Reports that he has not had a formal eye exam by an eye care professional within the past year . Reports that he has not had a formal hearing evaluation within the past year *Unable to fully assess hearing and vision during telephone visit type  Cognitive Function: 6CIT Screen 10/07/2019 10/04/2018  What Year? 0 points 0 points  What month? 0 points 0 points  What time? 0 points 0 points  Count back from 20 0 points 0 points  Months in reverse 0 points 0 points  Repeat phrase 0 points 0 points  Total Score 0 0   (Normal:0-7, Significant for Dysfunction: >8)  Normal Cognitive Function Screening: Yes   Immunization & Health Maintenance Record Immunization History  Administered Date(s) Administered  . Tdap 12/08/2007    Health Maintenance  Topic Date Due  . COVID-19 Vaccine (1) 10/13/2019 (Originally 01/10/1963)  . Hepatitis C Screening  03/28/2020 (Originally 1950/08/09)  . INFLUENZA VACCINE  05/10/2020 (Originally 09/11/2019)  . COLONOSCOPY  09/26/2020 (Originally 02/15/2017)  . TETANUS/TDAP  09/26/2020 (Originally 12/07/2017)  . PNA vac Low Risk Adult (1 of 2 - PCV13) 07/28/2021 (Originally 01/10/2014)  . COLON CANCER SCREENING ANNUAL FOBT  Discontinued       Assessment  This is a routine wellness examination for [employer].Marland Kitchen  Health Maintenance: Due or Overdue There are no preventive care reminders to display for this patient.  [name]. does not need a referral for Community Assistance: Care Management:   no Social Work:    no Prescription Assistance:  no Nutrition/Diabetes Education:  no   Plan:  Personalized Goals Goals Addressed            This Visit's Progress    . Patient Stated  10/07/2019 AWV Goal: Keep All Scheduled Appointments  Over the next year, patient will attend all scheduled appointments with their PCP and any specialists that they see.       Personalized Health Maintenance & Screening Recommendations    Lung Cancer Screening Recommended: no (Low Dose CT Chest recommended if Age 43-80 years, 30 pack-year currently smoking OR have quit w/in past 15 years) Hepatitis C Screening recommended: no HIV Screening recommended: no  Advanced Directives: Written information was not prepared per patient's request.  Referrals & Orders No orders of the defined types were placed in this encounter.   Follow-up Plan . Follow-up with Sharion Balloon, FNP as planned   I have personally reviewed and noted the following in the patient's chart:   . Medical and social history . Use of alcohol, tobacco or illicit drugs  . Current medications and supplements . Functional ability and status . Nutritional status . Physical activity . Advanced directives . List of other physicians . Hospitalizations, surgeries, and ER visits in previous 12 months . Vitals . Screenings to include cognitive, depression, and falls . Referrals and appointments  In addition, I have reviewed and discussed with [name]. certain preventive protocols, quality metrics, and best practice recommendations. A written personalized care plan for preventive services as well as general preventive health recommendations is available and can be mailed to the patient at his request.      Baldomero Lamy, LPN  [id number]

## 2019-10-26 DIAGNOSIS — R69 Illness, unspecified: Secondary | ICD-10-CM | POA: Diagnosis not present

## 2019-12-05 ENCOUNTER — Other Ambulatory Visit: Payer: Self-pay | Admitting: Family Medicine

## 2019-12-05 DIAGNOSIS — I2510 Atherosclerotic heart disease of native coronary artery without angina pectoris: Secondary | ICD-10-CM

## 2019-12-05 DIAGNOSIS — I2583 Coronary atherosclerosis due to lipid rich plaque: Secondary | ICD-10-CM

## 2019-12-19 ENCOUNTER — Other Ambulatory Visit: Payer: Self-pay | Admitting: Family

## 2019-12-19 DIAGNOSIS — E559 Vitamin D deficiency, unspecified: Secondary | ICD-10-CM

## 2020-01-03 ENCOUNTER — Telehealth (INDEPENDENT_AMBULATORY_CARE_PROVIDER_SITE_OTHER): Payer: Medicare HMO | Admitting: Family

## 2020-01-03 ENCOUNTER — Encounter: Payer: Self-pay | Admitting: Family

## 2020-01-03 DIAGNOSIS — B9689 Other specified bacterial agents as the cause of diseases classified elsewhere: Secondary | ICD-10-CM | POA: Diagnosis not present

## 2020-01-03 DIAGNOSIS — J208 Acute bronchitis due to other specified organisms: Secondary | ICD-10-CM | POA: Diagnosis not present

## 2020-01-03 DIAGNOSIS — R059 Cough, unspecified: Secondary | ICD-10-CM | POA: Diagnosis not present

## 2020-01-03 MED ORDER — DEXAMETHASONE 6 MG PO TABS: 6.0000 mg | ORAL_TABLET | Freq: Two times a day (BID) | ORAL | 0 refills | Status: DC

## 2020-01-03 MED ORDER — AZITHROMYCIN 250 MG PO TABS: ORAL_TABLET | ORAL | 0 refills | Status: DC

## 2020-01-03 NOTE — Progress Notes (Signed)
Virtual Visit via telephone Note Due to COVID-19 pandemic this visit was conducted virtually. This visit type was conducted due to national recommendations for restrictions regarding the COVID-19 Pandemic (e.g. social distancing, sheltering in place) in an effort to limit this patient's exposure and mitigate transmission in our community. All issues noted in this document were discussed and addressed.  A physical exam was not performed with this format.  I connected with Corey Ochs. on 01/03/20 at 8:28 AM  by telephone and verified that I am speaking with the correct person using two identifiers. Corey Ochs. is currently located at home and no one is currently with him during visit. The provider, Evelina Dun, FNP is located in their office at time of visit.  I discussed the limitations, risks, security and privacy concerns of performing an evaluation and management service by telephone and the availability of in person appointments. I also discussed with the patient that there may be a patient responsible charge related to this service. The patient expressed understanding and agreed to proceed.   History and Present Illness:  Cough This is a new problem. The current episode started 1 to 4 weeks ago. The problem has been gradually worsening. The problem occurs every few minutes. The cough is non-productive. Associated symptoms include nasal congestion and sweats. Pertinent negatives include no chills, ear congestion, ear pain, fever, headaches, myalgias or shortness of breath. The symptoms are aggravated by lying down. He has tried OTC cough suppressant for the symptoms. The treatment provided mild relief.      Review of Systems  Constitutional: Negative for chills and fever.  HENT: Negative for ear pain.   Respiratory: Positive for cough. Negative for shortness of breath.   Musculoskeletal: Negative for myalgias.  Neurological: Negative for headaches.  All other systems  reviewed and are negative.    Observations/Objective: No SOB, hoarse voice  Assessment and Plan: Corey Bubeck. comes in today with chief complaint of No chief complaint on file.   Diagnosis and orders addressed:  1. Cough - azithromycin (ZITHROMAX) 250 MG tablet; Take 500 mg once, then 250 mg for four days  Dispense: 6 tablet; Refill: 0 - Novel Coronavirus, NAA ([facility])  2. Acute bacterial bronchitis - Take meds as prescribed - Use a cool mist humidifier  -Use saline nose sprays frequently -Force fluids -For any cough or congestion  Use plain Mucinex- regular strength or max strength is fine -For fever or aces or pains- take tylenol or ibuprofen. -Throat lozenges if help -Call if symptoms worsen or do not improve  Encouraged COVID tested  - azithromycin (ZITHROMAX) 250 MG tablet; Take 500 mg once, then 250 mg for four days  Dispense: 6 tablet; Refill: 0 - dexamethasone (DECADRON) 6 MG tablet; Take 1 tablet (6 mg total) by mouth 2 (two) times daily with a meal.  Dispense: 14 tablet; Refill: 0      I discussed the assessment and treatment plan with the patient. The patient was provided an opportunity to ask questions and all were answered. The patient agreed with the plan and demonstrated an understanding of the instructions.   The patient was advised to call back or seek an in-person evaluation if the symptoms worsen or if the condition fails to improve as anticipated.  The above assessment and management plan was discussed with the patient. The patient verbalized understanding of and has agreed to the management plan. Patient is aware to call the clinic if symptoms persist or  worsen. Patient is aware when to return to the clinic for a follow-up visit. Patient educated on when it is appropriate to go to the emergency department.   Time call ended:  8:42 AM  I provided 14 minutes of non-face-to-face time during this encounter.    Evelina Dun, FNP

## 2020-01-04 LAB — NOVEL CORONAVIRUS, NAA: SARS-CoV-2, NAA: DETECTED — AB

## 2020-01-04 LAB — SARS-COV-2, NAA 2 DAY TAT

## 2020-01-05 ENCOUNTER — Other Ambulatory Visit: Payer: Self-pay | Admitting: Physician Assistant

## 2020-01-05 ENCOUNTER — Telehealth: Payer: Self-pay | Admitting: Physician Assistant

## 2020-01-05 DIAGNOSIS — G4733 Obstructive sleep apnea (adult) (pediatric): Secondary | ICD-10-CM

## 2020-01-05 DIAGNOSIS — U071 COVID-19: Secondary | ICD-10-CM

## 2020-01-05 DIAGNOSIS — I700 Atherosclerosis of aorta: Secondary | ICD-10-CM

## 2020-01-05 NOTE — Progress Notes (Signed)
I connected by phone with [name]. on 01/05/2020 at 10:06 AM to discuss the potential use of a new treatment for mild to moderate COVID-19 viral infection in non-hospitalized patients.  This patient is a 69 y.o. male that meets the FDA criteria for Emergency Use Authorization of COVID monoclonal antibody sotrovimab, casirivimab/imdevimab or bamlamivimab/estevimab.  Has a (+) direct SARS-CoV-2 viral test result  Has mild or moderate COVID-19   Is NOT hospitalized due to COVID-19  Is within 10 days of symptom onset  Has at least one of the high risk factor(s) for progression to severe COVID-19 and/or hospitalization as defined in EUA.  Specific high risk criteria : Older age (>/= 71 yo), Cardiovascular disease or hypertension and Other high risk medical condition per CDC:  high SVI   I have spoken and communicated the following to the patient or parent/caregiver regarding COVID monoclonal antibody treatment:  1. FDA has authorized the emergency use for the treatment of mild to moderate COVID-19 in adults and pediatric patients with positive results of direct SARS-CoV-2 viral testing who are 21 years of age and older weighing at least 40 kg, and who are at high risk for progressing to severe COVID-19 and/or hospitalization.  2. The significant known and potential risks and benefits of COVID monoclonal antibody, and the extent to which such potential risks and benefits are unknown.  3. Information on available alternative treatments and the risks and benefits of those alternatives, including clinical trials.  4. Patients treated with COVID monoclonal antibody should continue to self-isolate and use infection control measures (e.g., wear mask, isolate, social distance, avoid sharing personal items, clean and disinfect "high touch" surfaces, and frequent handwashing) according to CDC guidelines.   5. The patient or parent/caregiver has the option to accept or refuse COVID monoclonal  antibody treatment.  After reviewing this information with the patient, the patient has agreed to receive one of the available covid 19 monoclonal antibodies and will be provided an appropriate fact sheet prior to infusion.  Sx onset 11/19. Set up for infusion on 11/27 @ 12:30pm. Directions given to [facility]. Pt is aware that insurance will be charged an infusion fee. Pt is unvaccinated.  Angelena Form 01/05/2020 10:06 AM

## 2020-01-05 NOTE — Telephone Encounter (Signed)
Called to discuss with [name] about Covid symptoms and the use of sotrovimab, casirivimab/imdevimab or bamlanivimab/etesevimab infusion for those with mild to moderate Covid symptoms and at a high risk of hospitalization.     Pt is qualified for this infusion at the monoclonal antibody infusion center due to co-morbid conditions and/or a member of an at-risk group (age>65, high SVI), however would like to think more about the infusion at this time. Symptoms tier reviewed as well as criteria for ending isolation.  Symptoms reviewed that would warrant ED/Hospital evaluation. Preventative practices reviewed. Patient verbalized understanding. Patient advised to call back if he decides that he does want to get infusion. Callback number to the infusion center given. Patient advised to go to Urgent care or ED with severe symptoms. Last date pt would be eligible for infusion is 11/29.   Pt is unvaccinated. I sent him a Pharmacist, community message with more info and he has our hotline number if interested.     Patient Active Problem List   Diagnosis Date Noted  . Educated about COVID-19 virus infection 02/14/2019  . Function kidney decreased 12/22/2018  . Vitamin D deficiency 09/15/2018  . Trigger middle finger of right hand 09/15/2018  . Obstructive sleep apnea 10/14/2017  . Aortic atherosclerosis ([place]) 08/05/2016  . Diverticulosis of colon (without mention of hemorrhage)   . Mixed hyperlipidemia   . Benign neoplasm of colon   . COLONIC POLYPS 06/12/2007  . Hypothyroidism 06/12/2007  . Coronary artery disease 06/12/2007  . Coronary artery disease involving native coronary artery of native heart with angina pectoris with documented spasm ([place]) 06/12/2007  . GERD 06/12/2007  . Diverticulosis of colon 06/12/2007  . GUAIAC POSITIVE STOOL 06/12/2007    Angelena Form PA-C

## 2020-01-07 ENCOUNTER — Ambulatory Visit (HOSPITAL_COMMUNITY)
Admission: RE | Admit: 2020-01-07 | Discharge: 2020-01-07 | Disposition: A | Discharge status: Home / Self Care | Payer: Medicare HMO | Source: Ambulatory Visit | Attending: Pulmonary Disease | Admitting: Pulmonary Disease

## 2020-01-07 DIAGNOSIS — G4733 Obstructive sleep apnea (adult) (pediatric): Secondary | ICD-10-CM | POA: Insufficient documentation

## 2020-01-07 DIAGNOSIS — Z23 Encounter for immunization: Secondary | ICD-10-CM | POA: Insufficient documentation

## 2020-01-07 DIAGNOSIS — I700 Atherosclerosis of aorta: Secondary | ICD-10-CM

## 2020-01-07 DIAGNOSIS — U071 COVID-19: Secondary | ICD-10-CM | POA: Diagnosis present

## 2020-01-07 MED ORDER — DIPHENHYDRAMINE HCL 50 MG/ML IJ SOLN: 50.0000 mg | Freq: Once | INTRAMUSCULAR | Status: DC | PRN

## 2020-01-07 MED ORDER — ALBUTEROL SULFATE HFA 108 (90 BASE) MCG/ACT IN AERS: 2.0000 | INHALATION_SPRAY | Freq: Once | RESPIRATORY_TRACT | Status: DC | PRN

## 2020-01-07 MED ORDER — METHYLPREDNISOLONE SODIUM SUCC 125 MG IJ SOLR: 125.0000 mg | Freq: Once | INTRAMUSCULAR | Status: DC | PRN

## 2020-01-07 MED ORDER — SODIUM CHLORIDE 0.9 % IV SOLN: INTRAVENOUS | Status: DC | PRN

## 2020-01-07 MED ORDER — FAMOTIDINE IN NACL 20-0.9 MG/50ML-% IV SOLN: 20.0000 mg | Freq: Once | INTRAVENOUS | Status: DC | PRN

## 2020-01-07 MED ORDER — SOTROVIMAB 500 MG/8ML IV SOLN
500.0000 mg | Freq: Once | INTRAVENOUS | Status: AC
  Administered 2020-01-07: 500 mg via INTRAVENOUS

## 2020-01-07 MED ORDER — EPINEPHRINE 0.3 MG/0.3ML IJ SOAJ: 0.3000 mg | Freq: Once | INTRAMUSCULAR | Status: DC | PRN

## 2020-01-07 NOTE — Discharge Instructions (Signed)
What types of side effects do monoclonal antibody drugs cause?  Common side effects  In general, the more common side effects caused by monoclonal antibody drugs include: . Allergic reactions, such as hives or itching . Flu-like signs and symptoms, including chills, fatigue, fever, and muscle aches and pains . Nausea, vomiting . Diarrhea . Skin rashes . Low blood pressure   The CDC is recommending patients who receive monoclonal antibody treatments wait at least 90 days before being vaccinated.  Currently, there are no data on the safety and efficacy of mRNA COVID-19 vaccines in persons who received monoclonal antibodies or convalescent plasma as part of COVID-19 treatment. Based on the estimated half-life of such therapies as well as evidence suggesting that reinfection is uncommon in the 90 days after initial infection, vaccination should be deferred for at least 90 days, as a precautionary measure until additional information becomes available, to avoid interference of the antibody treatment with vaccine-induced immune responses.    10 Things You Can Do to Manage Your COVID-19 Symptoms at Home If you have possible or confirmed COVID-19: 1. Stay home from work and school. And stay away from other public places. If you must go out, avoid using any kind of public transportation, ridesharing, or taxis. 2. Monitor your symptoms carefully. If your symptoms get worse, call your healthcare provider immediately. 3. Get rest and stay hydrated. 4. If you have a medical appointment, call the healthcare provider ahead of time and tell them that you have or may have COVID-19. 5. For medical emergencies, call 911 and notify the dispatch personnel that you have or may have COVID-19. 6. Cover your cough and sneezes with a tissue or use the inside of your elbow. 7. Wash your hands often with soap and water for at least 20 seconds or clean your hands with an alcohol-based hand sanitizer that contains at  least 60% alcohol. 8. As much as possible, stay in a specific room and away from other people in your home. Also, you should use a separate bathroom, if available. If you need to be around other people in or outside of the home, wear a mask. 9. Avoid sharing personal items with other people in your household, like dishes, towels, and bedding. 10. Clean all surfaces that are touched often, like counters, tabletops, and doorknobs. Use household cleaning sprays or wipes according to the label instructions. [email or web address] 08/11/2018 This information is not intended to replace advice given to you by your health care provider. Make sure you discuss any questions you have with your health care provider. Document Revised: 01/13/2019 Document Reviewed: 01/13/2019 Elsevier Patient Education  [employer].  If you have any questions or concerns after the infusion please call the Advanced Practice Provider on call at [phone]

## 2020-01-07 NOTE — Progress Notes (Signed)
Patient reviewed Fact Sheet for Patients, Parents, and Caregivers for Emergency Use Authorization (EUA) of Sotrovimab for the Treatment of Coronavirus. Patient also reviewed and is agreeable to the estimated cost of treatment. Patient is agreeable to proceed.   

## 2020-01-07 NOTE — Progress Notes (Signed)
Diagnosis: COVID-19  Physician: Dr. Patrick Wright  Procedure: Covid Infusion Clinic Med: Sotrovimab infusion - Provided patient with sotrovimab fact sheet for patients, parents, and caregivers prior to infusion.   Complications: No immediate complications noted  Discharge: Discharged home    

## 2020-01-10 ENCOUNTER — Other Ambulatory Visit: Payer: Self-pay

## 2020-01-10 ENCOUNTER — Other Ambulatory Visit: Payer: Self-pay | Admitting: Family

## 2020-01-10 DIAGNOSIS — J208 Acute bronchitis due to other specified organisms: Secondary | ICD-10-CM

## 2020-01-10 DIAGNOSIS — B9689 Other specified bacterial agents as the cause of diseases classified elsewhere: Secondary | ICD-10-CM

## 2020-01-10 MED ORDER — DEXAMETHASONE 6 MG PO TABS: 6.0000 mg | ORAL_TABLET | Freq: Two times a day (BID) | ORAL | 0 refills | Status: DC

## 2020-01-10 NOTE — Telephone Encounter (Signed)
I denied today's electronic request from pharmacy, under policy NTBS for steriod refills Please advise

## 2020-01-10 NOTE — Telephone Encounter (Signed)
  Prescription Request  01/10/2020  What is the name of the medication or equipment? Dexamethasone Decabron-6mg  tab  Have you contacted your pharmacy to request a refill? (if applicable) yes  Which pharmacy would you like this sent to? [facility]   Patient notified that their request is being sent to the clinical staff for review and that they should receive a response within 2 business days.   Lenna Gilford' pt.  Please call pt.

## 2020-01-19 DIAGNOSIS — H40013 Open angle with borderline findings, low risk, bilateral: Secondary | ICD-10-CM | POA: Diagnosis not present

## 2020-01-19 DIAGNOSIS — H26493 Other secondary cataract, bilateral: Secondary | ICD-10-CM | POA: Diagnosis not present

## 2020-01-19 DIAGNOSIS — H35373 Puckering of macula, bilateral: Secondary | ICD-10-CM | POA: Diagnosis not present

## 2020-01-19 DIAGNOSIS — H04123 Dry eye syndrome of bilateral lacrimal glands: Secondary | ICD-10-CM | POA: Diagnosis not present

## 2020-01-30 ENCOUNTER — Other Ambulatory Visit: Payer: Self-pay | Admitting: Family

## 2020-01-30 ENCOUNTER — Telehealth: Payer: Self-pay

## 2020-01-30 DIAGNOSIS — E782 Mixed hyperlipidemia: Secondary | ICD-10-CM

## 2020-01-30 NOTE — Telephone Encounter (Signed)
Patient aware and states he thought someone was supposed to call him.

## 2020-03-12 ENCOUNTER — Other Ambulatory Visit: Payer: Self-pay | Admitting: Family

## 2020-03-12 DIAGNOSIS — I2583 Coronary atherosclerosis due to lipid rich plaque: Secondary | ICD-10-CM

## 2020-03-12 DIAGNOSIS — I2510 Atherosclerotic heart disease of native coronary artery without angina pectoris: Secondary | ICD-10-CM

## 2020-03-19 ENCOUNTER — Other Ambulatory Visit: Payer: Self-pay | Admitting: Family

## 2020-03-19 DIAGNOSIS — E559 Vitamin D deficiency, unspecified: Secondary | ICD-10-CM

## 2020-04-12 ENCOUNTER — Other Ambulatory Visit: Payer: Self-pay | Admitting: Family

## 2020-04-12 DIAGNOSIS — I2510 Atherosclerotic heart disease of native coronary artery without angina pectoris: Secondary | ICD-10-CM

## 2020-04-30 ENCOUNTER — Other Ambulatory Visit: Payer: Self-pay | Admitting: Family

## 2020-04-30 DIAGNOSIS — E782 Mixed hyperlipidemia: Secondary | ICD-10-CM

## 2020-05-10 ENCOUNTER — Telehealth: Payer: Self-pay

## 2020-05-10 NOTE — Telephone Encounter (Signed)
Patients questions answered concerning Covid

## 2020-05-14 ENCOUNTER — Other Ambulatory Visit: Payer: Self-pay | Admitting: Family

## 2020-05-14 DIAGNOSIS — I2510 Atherosclerotic heart disease of native coronary artery without angina pectoris: Secondary | ICD-10-CM

## 2020-05-14 DIAGNOSIS — I2583 Coronary atherosclerosis due to lipid rich plaque: Secondary | ICD-10-CM

## 2020-05-24 ENCOUNTER — Other Ambulatory Visit: Payer: Self-pay | Admitting: Family

## 2020-05-31 ENCOUNTER — Ambulatory Visit: Payer: Medicare HMO | Admitting: Family

## 2020-06-01 ENCOUNTER — Other Ambulatory Visit: Payer: Self-pay | Admitting: Family

## 2020-06-01 DIAGNOSIS — E782 Mixed hyperlipidemia: Secondary | ICD-10-CM

## 2020-06-12 ENCOUNTER — Other Ambulatory Visit: Payer: Self-pay | Admitting: Family

## 2020-06-12 DIAGNOSIS — E782 Mixed hyperlipidemia: Secondary | ICD-10-CM

## 2020-06-14 ENCOUNTER — Ambulatory Visit (INDEPENDENT_AMBULATORY_CARE_PROVIDER_SITE_OTHER): Payer: Medicare HMO | Admitting: Family

## 2020-06-14 ENCOUNTER — Other Ambulatory Visit: Payer: Self-pay

## 2020-06-14 ENCOUNTER — Encounter: Payer: Self-pay | Admitting: Family

## 2020-06-14 VITALS — BP 138/77 | HR 66 | Temp 97.9°F | Ht 75.0 in | Wt 218.8 lb

## 2020-06-14 DIAGNOSIS — K219 Gastro-esophageal reflux disease without esophagitis: Secondary | ICD-10-CM

## 2020-06-14 DIAGNOSIS — Z1211 Encounter for screening for malignant neoplasm of colon: Secondary | ICD-10-CM

## 2020-06-14 DIAGNOSIS — Z1159 Encounter for screening for other viral diseases: Secondary | ICD-10-CM

## 2020-06-14 DIAGNOSIS — I2583 Coronary atherosclerosis due to lipid rich plaque: Secondary | ICD-10-CM | POA: Diagnosis not present

## 2020-06-14 DIAGNOSIS — K429 Umbilical hernia without obstruction or gangrene: Secondary | ICD-10-CM

## 2020-06-14 DIAGNOSIS — G4733 Obstructive sleep apnea (adult) (pediatric): Secondary | ICD-10-CM

## 2020-06-14 DIAGNOSIS — R400 Somnolence: Secondary | ICD-10-CM

## 2020-06-14 DIAGNOSIS — E782 Mixed hyperlipidemia: Secondary | ICD-10-CM | POA: Diagnosis not present

## 2020-06-14 DIAGNOSIS — Z0000 Encounter for general adult medical examination without abnormal findings: Secondary | ICD-10-CM | POA: Diagnosis not present

## 2020-06-14 DIAGNOSIS — I2510 Atherosclerotic heart disease of native coronary artery without angina pectoris: Secondary | ICD-10-CM

## 2020-06-14 DIAGNOSIS — I700 Atherosclerosis of aorta: Secondary | ICD-10-CM

## 2020-06-14 DIAGNOSIS — Z0001 Encounter for general adult medical examination with abnormal findings: Secondary | ICD-10-CM | POA: Diagnosis not present

## 2020-06-14 DIAGNOSIS — E039 Hypothyroidism, unspecified: Secondary | ICD-10-CM

## 2020-06-14 DIAGNOSIS — E559 Vitamin D deficiency, unspecified: Secondary | ICD-10-CM | POA: Diagnosis not present

## 2020-06-14 DIAGNOSIS — R5383 Other fatigue: Secondary | ICD-10-CM

## 2020-06-14 DIAGNOSIS — R6889 Other general symptoms and signs: Secondary | ICD-10-CM | POA: Diagnosis not present

## 2020-06-14 MED ORDER — VITAMIN D (ERGOCALCIFEROL) 1.25 MG (50000 UNIT) PO CAPS: ORAL_CAPSULE | ORAL | 2 refills | Status: DC

## 2020-06-14 NOTE — Progress Notes (Signed)
Subjective:    Patient ID: Corey Ochs., male    DOB: [birth year], 70 y.o.   MRN: [id number]  Chief Complaint  Patient presents with  . Annual Exam   Pt presents to the office today for CPE.  He is followed by Cardiologists annually for CAD.   He reports he is having day time sleepiness. He has not been using his CPAP.  Gastroesophageal Reflux He complains of belching, heartburn and a hoarse voice. This is a chronic problem. The current episode started more than 1 year ago. The problem occurs occasionally. The problem has been waxing and waning. Associated symptoms include fatigue. He has tried a PPI for the symptoms. The treatment provided moderate relief.  Thyroid Problem Presents for follow-up visit. Symptoms include fatigue and hoarse voice. Patient reports no depressed mood. The symptoms have been stable. His past medical history is significant for hyperlipidemia.  Hyperlipidemia This is a chronic problem. The current episode started more than 1 year ago. Current antihyperlipidemic treatment includes diet change and statins. The current treatment provides mild improvement of lipids. Risk factors for coronary artery disease include a sedentary lifestyle and dyslipidemia.    OSA States he has not used his CPAP in years.     Review of Systems  Constitutional: Positive for fatigue.  HENT: Positive for hoarse voice.   Gastrointestinal: Positive for heartburn.  All other systems reviewed and are negative.      Family History  Problem Relation Age of Onset  . Hyperlipidemia Father   . Heart disease Father        pacemaker / CHF   . Dementia Maternal Grandmother   . Cancer Maternal Grandfather        throat  . Alzheimer's disease Paternal Grandmother   . Cancer Paternal Grandfather        lung  . Cancer Sister        breast  . Colon cancer Neg Hx    Social History   Socioeconomic History  . Marital status: Widowed    Spouse name: Not on file  . Number of  children: 3  . Years of education: 16  . Highest education level: Bachelor's degree (e.g., BA, AB, BS)  Occupational History  . Occupation: retired     Comment: golf / club prefessional   Tobacco Use  . Smoking status: Former Smoker    Packs/day: 0.50    Types: Cigarettes    Quit date: 07/22/1986    Years since quitting: 33.9  . Smokeless tobacco: Never Used  Vaping Use  . Vaping Use: Never used  Substance and Sexual Activity  . Alcohol use: Not Currently  . Drug use: No  . Sexual activity: Not Currently  Other Topics Concern  . Not on file  Social History Narrative   Retired, widowed, lives alone. 3 grown children. Enjoys golf.    Social Determinants of Health   Financial Resource Strain: Not on file  Food Insecurity: Not on file  Transportation Needs: Not on file  Physical Activity: Not on file  Stress: Not on file  Social Connections: Not on file    Objective:   Physical Exam Vitals reviewed.  Constitutional:      General: He is not in acute distress.    Appearance: He is well-developed.  HENT:     Head: Normocephalic.     Right Ear: Tympanic membrane normal.     Left Ear: Tympanic membrane normal.  Eyes:     General:  Right eye: No discharge.        Left eye: No discharge.     Pupils: Pupils are equal, round, and reactive to light.  Neck:     Thyroid: No thyromegaly.  Cardiovascular:     Rate and Rhythm: Normal rate and regular rhythm.     Heart sounds: Normal heart sounds. No murmur heard.   Pulmonary:     Effort: Pulmonary effort is normal. No respiratory distress.     Breath sounds: Normal breath sounds. No wheezing.  Abdominal:     General: Bowel sounds are normal. There is no distension.     Palpations: Abdomen is soft.     Tenderness: There is no abdominal tenderness.     Hernia: A hernia is present. Hernia is present in the umbilical area.  Musculoskeletal:        General: No tenderness. Normal range of motion.     Cervical back:  Normal range of motion and neck supple.  Skin:    General: Skin is warm and dry.     Findings: No erythema or rash.  Neurological:     Mental Status: He is alert and oriented to person, place, and time.     Cranial Nerves: No cranial nerve deficit.     Deep Tendon Reflexes: Reflexes are normal and symmetric.  Psychiatric:        Behavior: Behavior normal.        Thought Content: Thought content normal.        Judgment: Judgment normal.     BP 138/77   Pulse 66   Temp 97.9 F (36.6 C)   Ht 6' 3"  (1.905 m)   Wt 218 lb 12.8 oz (99.2 kg)   SpO2 96%   BMI 27.35 kg/m        Assessment & Plan:  Corey Ochs. comes in today with chief complaint of Annual Exam   Diagnosis and orders addressed:  1. Aortic atherosclerosis (HCC) - CMP14+EGFR - Lipid panel  2. Annual physical exam - CMP14+EGFR - VITAMIN D 25 Hydroxy (Vit-D Deficiency, Fractures)  3. Coronary artery disease due to lipid rich plaque - CMP14+EGFR  4. Obstructive sleep apnea - CMP14+EGFR  5. Gastroesophageal reflux disease without esophagitis - CMP14+EGFR  6. Acquired hypothyroidism - CMP14+EGFR - TSH  7. Mixed hyperlipidemia - CMP14+EGFR  8. Vitamin D deficiency - CMP14+EGFR - VITAMIN D 25 Hydroxy (Vit-D Deficiency, Fractures) - Vitamin D, Ergocalciferol, (DRISDOL) 1.25 MG (50000 UNIT) CAPS capsule; TAKE 1 CAPSULE EVERY WEEK  Dispense: 12 capsule; Refill: 2  9. Daytime sleepiness - CMP14+EGFR - Anemia Profile B  10. Umbilical hernia without obstruction and without gangrene - CMP14+EGFR - Ambulatory referral to General Surgery  11. Fatigue, unspecified type  - CMP14+EGFR  12. Need for hepatitis C screening test - Hepatitis C antibody  13. Colon cancer screening - Ambulatory referral to Gastroenterology   Labs pending Health Maintenance reviewed Diet and exercise encouraged  Follow up plan: 6 months   Evelina Dun, FNP

## 2020-06-14 NOTE — Patient Instructions (Signed)
Sleep Apnea Sleep apnea is a condition in which breathing pauses or becomes shallow during sleep. Episodes of sleep apnea usually last 10 seconds or longer, and they may occur as many as 20 times an hour. Sleep apnea disrupts your sleep and keeps your body from getting the rest that it needs. This condition can increase your risk of certain health problems, including:  Heart attack.  Stroke.  Obesity.  Diabetes.  Heart failure.  Irregular heartbeat. What are the causes? There are three kinds of sleep apnea:  Obstructive sleep apnea. This kind is caused by a blocked or collapsed airway.  Central sleep apnea. This kind happens when the part of the brain that controls breathing does not send the correct signals to the muscles that control breathing.  Mixed sleep apnea. This is a combination of obstructive and central sleep apnea. The most common cause of this condition is a collapsed or blocked airway. An airway can collapse or become blocked if:  Your throat muscles are abnormally relaxed.  Your tongue and tonsils are larger than normal.  You are overweight.  Your airway is smaller than normal.   What increases the risk? You are more likely to develop this condition if you:  Are overweight.  Smoke.  Have a smaller than normal airway.  Are elderly.  Are male.  Drink alcohol.  Take sedatives or tranquilizers.  Have a family history of sleep apnea. What are the signs or symptoms? Symptoms of this condition include:  Trouble staying asleep.  Daytime sleepiness and tiredness.  Irritability.  Loud snoring.  Morning headaches.  Trouble concentrating.  Forgetfulness.  Decreased interest in sex.  Unexplained sleepiness.  Mood swings.  Personality changes.  Feelings of depression.  Waking up often during the night to urinate.  Dry mouth.  Sore throat. How is this diagnosed? This condition may be diagnosed with:  A medical history.  A physical  exam.  A series of tests that are done while you are sleeping (sleep study). These tests are usually done in a sleep lab, but they may also be done at home. How is this treated? Treatment for this condition aims to restore normal breathing and to ease symptoms during sleep. It may involve managing health issues that can affect breathing, such as high blood pressure or obesity. Treatment may include:  Sleeping on your side.  Using a decongestant if you have nasal congestion.  Avoiding the use of depressants, including alcohol, sedatives, and narcotics.  Losing weight if you are overweight.  Making changes to your diet.  Quitting smoking.  Using a device to open your airway while you sleep, such as: ? An oral appliance. This is a custom-made mouthpiece that shifts your lower jaw forward. ? A continuous positive airway pressure (CPAP) device. This device blows air through a mask when you breathe out (exhale). ? A nasal expiratory positive airway pressure (EPAP) device. This device has valves that you put into each nostril. ? A bi-level positive airway pressure (BPAP) device. This device blows air through a mask when you breathe in (inhale) and breathe out (exhale).  Having surgery if other treatments do not work. During surgery, excess tissue is removed to create a wider airway. It is important to get treatment for sleep apnea. Without treatment, this condition can lead to:  High blood pressure.  Coronary artery disease.  In men, an inability to achieve or maintain an erection (impotence).  Reduced thinking abilities.   Follow these instructions at home: Lifestyle    Make any lifestyle changes that your health care provider recommends.  Eat a healthy, well-balanced diet.  Take steps to lose weight if you are overweight.  Avoid using depressants, including alcohol, sedatives, and narcotics.  Do not use any products that contain nicotine or tobacco, such as cigarettes,  e-cigarettes, and chewing tobacco. If you need help quitting, ask your health care provider. General instructions  Take over-the-counter and prescription medicines only as told by your health care provider.  If you were given a device to open your airway while you sleep, use it only as told by your health care provider.  If you are having surgery, make sure to tell your health care provider you have sleep apnea. You may need to bring your device with you.  Keep all follow-up visits as told by your health care provider. This is important. Contact a health care provider if:  The device that you received to open your airway during sleep is uncomfortable or does not seem to be working.  Your symptoms do not improve.  Your symptoms get worse. Get help right away if:  You develop: ? Chest pain. ? Shortness of breath. ? Discomfort in your back, arms, or stomach.  You have: ? Trouble speaking. ? Weakness on one side of your body. ? Drooping in your face. These symptoms may represent a serious problem that is an emergency. Do not wait to see if the symptoms will go away. Get medical help right away. Call your local emergency services (911 in the [place] Do not drive yourself to the hospital. Summary  Sleep apnea is a condition in which breathing pauses or becomes shallow during sleep.  The most common cause is a collapsed or blocked airway.  The goal of treatment is to restore normal breathing and to ease symptoms during sleep. This information is not intended to replace advice given to you by your health care provider. Make sure you discuss any questions you have with your health care provider. Document Revised: 07/14/2018 Document Reviewed: 09/22/2017 Elsevier Patient Education  2021 Else[employer] Inc.  

## 2020-06-15 ENCOUNTER — Other Ambulatory Visit: Payer: Self-pay | Admitting: Family

## 2020-06-15 LAB — HEPATITIS C ANTIBODY: Hep C Virus Ab: <0.1 s/co ratio (ref 0.0–0.9)

## 2020-06-15 LAB — ANEMIA PROFILE B
Basophils Absolute: 0.1 10*3/uL (ref 0.0–0.2)
Basos: 1 %
EOS (ABSOLUTE): 0.2 10*3/uL (ref 0.0–0.4)
Eos: 4 %
Ferritin: 73 ng/mL (ref 30–400)
Folate: 10.1 ng/mL (ref >3.0)
Hematocrit: 40.6 % (ref 37.5–51.0)
Hemoglobin: 13.5 g/dL (ref 13.0–17.7)
Immature Grans (Abs): 0 10*3/uL (ref 0.0–0.1)
Immature Granulocytes: 1 %
Iron Saturation: 19 % (ref 15–55)
Iron: 71 ug/dL (ref 38–169)
Lymphocytes Absolute: 1.7 10*3/uL (ref 0.7–3.1)
Lymphs: 29 %
MCH: 28.7 pg (ref 26.6–33.0)
MCHC: 33.3 g/dL (ref 31.5–35.7)
MCV: 86 fL (ref 79–97)
Monocytes Absolute: 0.6 10*3/uL (ref 0.1–0.9)
Monocytes: 10 %
Neutrophils Absolute: 3.2 10*3/uL (ref 1.4–7.0)
Neutrophils: 55 %
Platelets: 275 10*3/uL (ref 150–450)
RBC: 4.7 x10E6/uL (ref 4.14–5.80)
RDW: 13.6 % (ref 11.6–15.4)
Retic Ct Pct: 1.9 % (ref 0.6–2.6)
Total Iron Binding Capacity: 365 ug/dL (ref 250–450)
UIBC: 294 ug/dL (ref 111–343)
Vitamin B-12: 280 pg/mL (ref 232–1245)
WBC: 5.7 10*3/uL (ref 3.4–10.8)

## 2020-06-15 LAB — CMP14+EGFR
ALT: 32 IU/L (ref 0–44)
AST: 27 IU/L (ref 0–40)
Albumin/Globulin Ratio: 1.9 (ref 1.2–2.2)
Albumin: 4.9 g/dL — ABNORMAL HIGH (ref 3.7–4.7)
Alkaline Phosphatase: 92 IU/L (ref 44–121)
BUN/Creatinine Ratio: 18 (ref 10–24)
BUN: 15 mg/dL (ref 8–27)
Bilirubin Total: 0.4 mg/dL (ref 0.0–1.2)
CO2: 18 mmol/L — ABNORMAL LOW (ref 20–29)
Calcium: 9.2 mg/dL (ref 8.6–10.2)
Chloride: 99 mmol/L (ref 96–106)
Creatinine, Ser: 0.82 mg/dL (ref 0.76–1.27)
Globulin, Total: 2.6 g/dL (ref 1.5–4.5)
Glucose: 110 mg/dL — ABNORMAL HIGH (ref 65–99)
Potassium: 4.2 mmol/L (ref 3.5–5.2)
Sodium: 139 mmol/L (ref 134–144)
Total Protein: 7.5 g/dL (ref 6.0–8.5)
eGFR: 94 mL/min/{1.73_m2} (ref >59)

## 2020-06-15 LAB — TSH: TSH: 4.51 u[IU]/mL — ABNORMAL HIGH (ref 0.450–4.500)

## 2020-06-15 LAB — LIPID PANEL
Chol/HDL Ratio: 7.6 ratio — ABNORMAL HIGH (ref 0.0–5.0)
Cholesterol, Total: 243 mg/dL — ABNORMAL HIGH (ref 100–199)
HDL: 32 mg/dL — ABNORMAL LOW (ref >39)
Triglycerides: 868 mg/dL (ref 0–149)

## 2020-06-15 LAB — VITAMIN D 25 HYDROXY (VIT D DEFICIENCY, FRACTURES): Vit D, 25-Hydroxy: 36 ng/mL (ref 30.0–100.0)

## 2020-06-15 MED ORDER — FENOFIBRATE 145 MG PO TABS: 145.0000 mg | ORAL_TABLET | Freq: Every day | ORAL | 1 refills | Status: DC

## 2020-06-16 ENCOUNTER — Other Ambulatory Visit: Payer: Self-pay | Admitting: Family

## 2020-06-16 DIAGNOSIS — I2583 Coronary atherosclerosis due to lipid rich plaque: Secondary | ICD-10-CM

## 2020-06-16 DIAGNOSIS — I2510 Atherosclerotic heart disease of native coronary artery without angina pectoris: Secondary | ICD-10-CM

## 2020-07-04 ENCOUNTER — Other Ambulatory Visit: Payer: Self-pay | Admitting: Family

## 2020-07-04 DIAGNOSIS — E782 Mixed hyperlipidemia: Secondary | ICD-10-CM

## 2020-07-21 ENCOUNTER — Other Ambulatory Visit: Payer: Self-pay | Admitting: Family

## 2020-07-21 DIAGNOSIS — E039 Hypothyroidism, unspecified: Secondary | ICD-10-CM

## 2020-07-31 ENCOUNTER — Ambulatory Visit: Payer: Medicare HMO | Admitting: General Surgery

## 2020-10-11 ENCOUNTER — Encounter: Payer: Self-pay | Admitting: Family Medicine

## 2020-10-11 ENCOUNTER — Ambulatory Visit (INDEPENDENT_AMBULATORY_CARE_PROVIDER_SITE_OTHER): Payer: Medicare HMO | Admitting: Family Medicine

## 2020-10-11 DIAGNOSIS — J019 Acute sinusitis, unspecified: Secondary | ICD-10-CM

## 2020-10-11 MED ORDER — AZITHROMYCIN 250 MG PO TABS: ORAL_TABLET | ORAL | 0 refills | Status: DC

## 2020-10-11 NOTE — Progress Notes (Signed)
   Virtual Visit  Note Due to COVID-19 pandemic this visit was conducted virtually. This visit type was conducted due to national recommendations for restrictions regarding the COVID-19 Pandemic (e.g. social distancing, sheltering in place) in an effort to limit this patient's exposure and mitigate transmission in our community. All issues noted in this document were discussed and addressed.  A physical exam was not performed with this format.  I connected with Corey Ochs. on 10/11/20 at 1041 by telephone and verified that I am speaking with the correct person using two identifiers. Corey Ochs. is currently located at home and no one is currently with him during the visit. The provider, Gwenlyn Perking, FNP is located in their office at time of visit.  I discussed the limitations, risks, security and privacy concerns of performing an evaluation and management service by telephone and the availability of in person appointments. I also discussed with the patient that there may be a patient responsible charge related to this service. The patient expressed understanding and agreed to proceed.  CC: cough  History and Present Illness:  HPI [name] reports a cough, sore throat, and head and chest congestion x 7 days. His sore throat has improved but his other symptoms have not. He has been taking robitussin without improvement. He denies fever, aches, chills, abdominal pain, diarrhea, vomiting, shortness of breath, or chest pain.    ROS As per HPI.  Observations/Objective: Alert and oriented x 3. Able to speak in full sentences without difficulty.   Assessment and Plan: [name] was seen today for cough.  Diagnoses and all orders for this visit:  Acute non-recurrent sinusitis, unspecified location Discussed possibility of Covid as cause of symptoms, however patient declines testing today. He will take a home Covid test. Zpak as below. Plain mucinex for cough/congestion. Stay well  hydrated. Return to office for new or worsening symptoms, or if symptoms persist.  -     azithromycin (ZITHROMAX Z-PAK) 250 MG tablet; As directed    Follow Up Instructions: As needed.     I discussed the assessment and treatment plan with the patient. The patient was provided an opportunity to ask questions and all were answered. The patient agreed with the plan and demonstrated an understanding of the instructions.   The patient was advised to call back or seek an in-person evaluation if the symptoms worsen or if the condition fails to improve as anticipated.  The above assessment and management plan was discussed with the patient. The patient verbalized understanding of and has agreed to the management plan. Patient is aware to call the clinic if symptoms persist or worsen. Patient is aware when to return to the clinic for a follow-up visit. Patient educated on when it is appropriate to go to the emergency department.   Time call ended:  1054  I provided 13 minutes of  non face-to-face time during this encounter.    Gwenlyn Perking, FNP

## 2020-10-12 ENCOUNTER — Ambulatory Visit (INDEPENDENT_AMBULATORY_CARE_PROVIDER_SITE_OTHER): Payer: Medicare HMO

## 2020-10-12 ENCOUNTER — Telehealth: Payer: Self-pay

## 2020-10-12 VITALS — Ht 75.0 in | Wt 218.0 lb

## 2020-10-12 DIAGNOSIS — G4733 Obstructive sleep apnea (adult) (pediatric): Secondary | ICD-10-CM

## 2020-10-12 DIAGNOSIS — Z0000 Encounter for general adult medical examination without abnormal findings: Secondary | ICD-10-CM | POA: Diagnosis not present

## 2020-10-12 NOTE — Patient Instructions (Signed)
[name] , Thank you for taking time to come for your Medicare Wellness Visit. I appreciate your ongoing commitment to your health goals. Please review the following plan we discussed and let me know if I can assist you in the future.   Screening recommendations/referrals: Colonoscopy: 02/16/2012, due 3-5 years. Call GI to set up appointment or reach out to [place] if referral is needed.  Recommended yearly ophthalmology/optometry visit for glaucoma screening and checkup Recommended yearly dental visit for hygiene and checkup  Vaccinations: Influenza vaccine: Due in fall Pneumococcal vaccine: Declines Tdap vaccine: 12/08/2007 Repeat in 10 years  Shingles vaccine: Shingrix discussed. Please contact your pharmacy for coverage information.     Covid-19: Declines  Advanced directives: Please bring a copy of your health care power of attorney and living will to the office to be added to your chart at your convenience.   Conditions/risks identified: Aim for 30 minutes of exercise or brisk walking each day, drink 6-8 glasses of water and eat lots of fruits and vegetables.   Next appointment: Follow up in one year for your annual wellness visit.   Preventive Care 33 Years and Older, Male  Preventive care refers to lifestyle choices and visits with your health care provider that can promote health and wellness. What does preventive care include? A yearly physical exam. This is also called an annual well check. Dental exams once or twice a year. Routine eye exams. Ask your health care provider how often you should have your eyes checked. Personal lifestyle choices, including: Daily care of your teeth and gums. Regular physical activity. Eating a healthy diet. Avoiding tobacco and drug use. Limiting alcohol use. Practicing safe sex. Taking low doses of aspirin every day. Taking vitamin and mineral supplements as recommended by your health care provider. What happens during an annual well  check? The services and screenings done by your health care provider during your annual well check will depend on your age, overall health, lifestyle risk factors, and family history of disease. Counseling  Your health care provider may ask you questions about your: Alcohol use. Tobacco use. Drug use. Emotional well-being. Home and relationship well-being. Sexual activity. Eating habits. History of falls. Memory and ability to understand (cognition). Work and work Statistician. Screening  You may have the following tests or measurements: Height, weight, and BMI. Blood pressure. Lipid and cholesterol levels. These may be checked every 5 years, or more frequently if you are over 63 years old. Skin check. Lung cancer screening. You may have this screening every year starting at age 80 if you have a 30-pack-year history of smoking and currently smoke or have quit within the past 15 years. Fecal occult blood test (FOBT) of the stool. You may have this test every year starting at age 7. Flexible sigmoidoscopy or colonoscopy. You may have a sigmoidoscopy every 5 years or a colonoscopy every 10 years starting at age 29. Prostate cancer screening. Recommendations will vary depending on your family history and other risks. Hepatitis C blood test. Hepatitis B blood test. Sexually transmitted disease (STD) testing. Diabetes screening. This is done by checking your blood sugar (glucose) after you have not eaten for a while (fasting). You may have this done every 1-3 years. Abdominal aortic aneurysm (AAA) screening. You may need this if you are a current or former smoker. Osteoporosis. You may be screened starting at age 70 if you are at high risk. Talk with your health care provider about your test results, treatment options, and if  necessary, the need for more tests. Vaccines  Your health care provider may recommend certain vaccines, such as: Influenza vaccine. This is recommended every  year. Tetanus, diphtheria, and acellular pertussis (Tdap, Td) vaccine. You may need a Td booster every 10 years. Zoster vaccine. You may need this after age 35. Pneumococcal 13-valent conjugate (PCV13) vaccine. One dose is recommended after age 40. Pneumococcal polysaccharide (PPSV23) vaccine. One dose is recommended after age 32. Talk to your health care provider about which screenings and vaccines you need and how often you need them. This information is not intended to replace advice given to you by your health care provider. Make sure you discuss any questions you have with your health care provider. Document Released: 02/23/2015 Document Revised: 10/17/2015 Document Reviewed: 11/28/2014 Elsevier Interactive Patient Education  2017 [place] Prevention in the Home Falls can cause injuries. They can happen to people of all ages. There are many things you can do to make your home safe and to help prevent falls. What can I do on the outside of my home? Regularly fix the edges of walkways and driveways and fix any cracks. Remove anything that might make you trip as you walk through a door, such as a raised step or threshold. Trim any bushes or trees on the path to your home. Use bright outdoor lighting. Clear any walking paths of anything that might make someone trip, such as rocks or tools. Regularly check to see if handrails are loose or broken. Make sure that both sides of any steps have handrails. Any raised decks and porches should have guardrails on the edges. Have any leaves, snow, or ice cleared regularly. Use sand or salt on walking paths during winter. Clean up any spills in your garage right away. This includes oil or grease spills. What can I do in the bathroom? Use night lights. Install grab bars by the toilet and in the tub and shower. Do not use towel bars as grab bars. Use non-skid mats or decals in the tub or shower. If you need to sit down in the shower, use a  plastic, non-slip stool. Keep the floor dry. Clean up any water that spills on the floor as soon as it happens. Remove soap buildup in the tub or shower regularly. Attach bath mats securely with double-sided non-slip rug tape. Do not have throw rugs and other things on the floor that can make you trip. What can I do in the bedroom? Use night lights. Make sure that you have a light by your bed that is easy to reach. Do not use any sheets or blankets that are too big for your bed. They should not hang down onto the floor. Have a firm chair that has side arms. You can use this for support while you get dressed. Do not have throw rugs and other things on the floor that can make you trip. What can I do in the kitchen? Clean up any spills right away. Avoid walking on wet floors. Keep items that you use a lot in easy-to-reach places. If you need to reach something above you, use a strong step stool that has a grab bar. Keep electrical cords out of the way. Do not use floor polish or wax that makes floors slippery. If you must use wax, use non-skid floor wax. Do not have throw rugs and other things on the floor that can make you trip. What can I do with my stairs? Do not leave any items  on the stairs. Make sure that there are handrails on both sides of the stairs and use them. Fix handrails that are broken or loose. Make sure that handrails are as long as the stairways. Check any carpeting to make sure that it is firmly attached to the stairs. Fix any carpet that is loose or worn. Avoid having throw rugs at the top or bottom of the stairs. If you do have throw rugs, attach them to the floor with carpet tape. Make sure that you have a light switch at the top of the stairs and the bottom of the stairs. If you do not have them, ask someone to add them for you. What else can I do to help prevent falls? Wear shoes that: Do not have high heels. Have rubber bottoms. Are comfortable and fit you  well. Are closed at the toe. Do not wear sandals. If you use a stepladder: Make sure that it is fully opened. Do not climb a closed stepladder. Make sure that both sides of the stepladder are locked into place. Ask someone to hold it for you, if possible. Clearly mark and make sure that you can see: Any grab bars or handrails. First and last steps. Where the edge of each step is. Use tools that help you move around (mobility aids) if they are needed. These include: Canes. Walkers. Scooters. Crutches. Turn on the lights when you go into a dark area. Replace any light bulbs as soon as they burn out. Set up your furniture so you have a clear path. Avoid moving your furniture around. If any of your floors are uneven, fix them. If there are any pets around you, be aware of where they are. Review your medicines with your doctor. Some medicines can make you feel dizzy. This can increase your chance of falling. Ask your doctor what other things that you can do to help prevent falls. This information is not intended to replace advice given to you by your health care provider. Make sure you discuss any questions you have with your health care provider. Document Released: 11/23/2008 Document Revised: 07/05/2015 Document Reviewed: 03/03/2014 Elsevier Interactive Patient Education  2017 [employer].

## 2020-10-12 NOTE — Telephone Encounter (Signed)
Spoke with pt today at [facility]. Pt states he has not used his C-PAP in 2 years due to it not working correctly. Pt asks that since it has been 18 years since last sleep apnea evaluation, if he can be referred to do a home sleep study? Thank you.

## 2020-10-12 NOTE — Telephone Encounter (Signed)
Defer to PCP return next week

## 2020-10-12 NOTE — Progress Notes (Signed)
Subjective:   [name]. is a 69 y.o. male who presents for Medicare Annual/Subsequent preventive examination. Virtual Visit via Telephone Note  I connected with  [name] on 10/12/20 at  3:30 PM EDT by telephone and verified that I am speaking with the correct person using two identifiers.  Location: Patient: Home Provider: [facility] Persons participating in the virtual visit: patient/Nurse Health Advisor   I discussed the limitations, risks, security and privacy concerns of performing an evaluation and management service by telephone and the availability of in person appointments. The patient expressed understanding and agreed to proceed.  Interactive audio and video telecommunications were attempted between this nurse and patient, however failed, due to patient having technical difficulties OR patient did not have access to video capability.  We continued and completed visit with audio only.  Some vital signs may be absent or patient reported.   Chriss Driver, LPN  Review of Systems     Cardiac Risk Factors include: advanced age (>65mn, >>26women);male gender;dyslipidemia     Objective:    Today's Vitals   10/12/20 1534  Weight: 218 lb (98.9 kg)  Height: '6\' 3"'$  (1.905 m)   Body mass index is 27.25 kg/m.  Advanced Directives 10/12/2020 10/07/2019 10/04/2018 09/28/2017  Does Patient Have a Medical Advance Directive? No Yes No No  Type of Advance Directive - Healthcare Power of [place] will;Out of facility DNR (pink MOST or yellow form) - -  [place] of [place]? - No - copy requested - -  Would patient like information on creating a medical advance directive? No - Patient declined - No - Patient declined Yes (MAU/Ambulatory/Procedural Areas - Information given)    Current Medications (verified) Outpatient Encounter Medications as of 10/12/2020  Medication Sig   aspirin 81 MG EC tablet Take 81 mg by mouth daily.    azithromycin (ZITHROMAX Z-PAK) 250 MG tablet As directed   ezetimibe-simvastatin (VYTORIN) 10-20 MG tablet TAKE ONE (1) TABLET EACH DAY   fenofibrate (TRICOR) 145 MG tablet Take 1 tablet (145 mg total) by mouth daily.   fluticasone (FLONASE) 50 MCG/ACT nasal spray Place 2 sprays into both nostrils daily.   levothyroxine (SYNTHROID) 100 MCG tablet TAKE ONE (1) TABLET EACH DAY   levothyroxine (SYNTHROID) 25 MCG tablet Take 0.5 tab on Mon, Wed and Fri mornings before breakfast.   omeprazole (PRILOSEC) 20 MG capsule Take 2 caps po daily   ranolazine (RANEXA) 500 MG 12 hr tablet TAKE ONE TABLET BY MOUTH TWICE DAILY   Vitamin D, Ergocalciferol, (DRISDOL) 1.25 MG (50000 UNIT) CAPS capsule TAKE 1 CAPSULE EVERY WEEK   No facility-administered encounter medications on file as of 10/12/2020.    Allergies (verified) Imdur [isosorbide nitrate] and Lipitor [atorvastatin]   History: Past Medical History:  Diagnosis Date   Angina at rest ([facility]    Benign neoplasm of colon    CAD (coronary artery disease)     Occluded RCA, 50% Diat 2006, 2008   Diverticulosis of colon (without mention of hemorrhage)    Esophageal reflux    Other and unspecified hyperlipidemia    Unspecified disorder of thyroid    Past Surgical History:  Procedure Laterality Date   TONSILLECTOMY  1955   Family History  Problem Relation Age of Onset   Hyperlipidemia Father    Heart disease Father        pacemaker / CHF    Dementia Maternal Grandmother    Cancer Maternal  Grandfather        throat   Alzheimer's disease Paternal Grandmother    Cancer Paternal Grandfather        lung   Cancer Sister        breast   Colon cancer Neg Hx    Social History   Socioeconomic History   Marital status: Widowed    Spouse name: Not on file   Number of children: 3   Years of education: 16   Highest education level: Bachelor's degree (e.g., BA, AB, BS)  Occupational History   Occupation: retired     Comment: golf / club  prefessional   Tobacco Use   Smoking status: Former    Packs/day: 0.50    Types: Cigarettes    Quit date: 07/22/1986    Years since quitting: 34.2   Smokeless tobacco: Never  Vaping Use   Vaping Use: Never used  Substance and Sexual Activity   Alcohol use: Not Currently   Drug use: No   Sexual activity: Not Currently  Other Topics Concern   Not on file  Social History Narrative   Retired, widowed, lives alone. 3 grown children. Enjoys golf.    Social Determinants of Health   Financial Resource Strain: Low Risk    Difficulty of Paying Living Expenses: Not hard at all  Food Insecurity: No Food Insecurity   Worried About Charity fundraiser in the Last Year: Never true   [place] in the Last Year: Never true  Transportation Needs: No Transportation Needs   Lack of Transportation (Medical): No   Lack of Transportation (Non-Medical): No  Physical Activity: Sufficiently Active   Days of Exercise per Week: 5 days   Minutes of Exercise per Session: 30 min  Stress: No Stress Concern Present   Feeling of Stress : Not at all  Social Connections: Moderately Integrated   Frequency of Communication with Friends and Family: More than three times a week   Frequency of Social Gatherings with Friends and Family: More than three times a week   Attends Religious Services: More than 4 times per year   Active Member of [employer] or Organizations: Yes   Attends Archivist Meetings: More than 4 times per year   Marital Status: Widowed    Tobacco Counseling Counseling given: Not Answered   Clinical Intake:  Pre-visit preparation completed: Yes  Pain : No/denies pain     BMI - recorded: 27.75 Nutritional Status: BMI 25 -29 Overweight Nutritional Risks: None Diabetes: No  How often do you need to have someone help you when you read instructions, pamphlets, or other written materials from your doctor or pharmacy?: 1 - Never  Diabetic?No  Interpreter Needed?:  No  Information entered by :: Randal Buba, LPN   Activities of Daily Living In your present state of health, do you have any difficulty performing the following activities: 10/12/2020  Hearing? N  Vision? N  Difficulty concentrating or making decisions? N  Walking or climbing stairs? N  Dressing or bathing? N  Doing errands, shopping? N  Preparing Food and eating ? N  Using the Toilet? N  In the past six months, have you accidently leaked urine? N  Do you have problems with loss of bowel control? N  Managing your Medications? N  Managing your Finances? N  Housekeeping or managing your Housekeeping? N  Some recent data might be hidden    Patient Care Team: Sharion Balloon, FNP as PCP -  General (Family Medicine) Pyrtle, Lajuan Lines, MD as Consulting Physician (Gastroenterology) Laurin Coder, MD as Consulting Physician (Pulmonary Disease) Minus Breeding, MD as Consulting Physician (Cardiology)  Indicate any recent Medical Services you may have received from other than [facility] providers in the past year (date may be approximate).     Assessment:   This is a routine wellness examination for [facility].  Hearing/Vision screen Hearing Screening - Comments:: No hearing issues per pt. Vision Screening - Comments:: Readers. Up to Date on eye exams. Dr. Bing Plume in [facility].  Dietary issues and exercise activities discussed: Current Exercise Habits: Home exercise routine, Type of exercise: walking, Time (Minutes): 30, Frequency (Times/Week): 5, Weekly Exercise (Minutes/Week): 150, Intensity: Mild, Exercise limited by: cardiac condition(s)   Goals Addressed             This Visit's Progress    DIET - INCREASE WATER INTAKE   On track    Try to drink 6-8 glasses of water daily.     Increase physical activity   Not on track    Exercise more  Swim more  Play more golf       Depression Screen PHQ 2/9 Scores 10/12/2020 06/14/2020 10/07/2019 09/27/2019 03/29/2019 12/22/2018 10/04/2018   PHQ - 2 Score 0 0 0 0 0 0 0    Fall Risk Fall Risk  10/12/2020 06/14/2020 10/07/2019 09/27/2019 03/29/2019  Falls in the past year? 0 0 0 0 0  Number falls in past yr: 0 - - - -  Injury with Fall? 0 - - - -  Risk for fall due to : No Fall Risks - - - -  Follow up Falls prevention discussed - - - -    FALL RISK PREVENTION PERTAINING TO THE HOME:  Any stairs in or around the home? Yes  If so, are there any without handrails? Yes  Home free of loose throw rugs in walkways, pet beds, electrical cords, etc? Yes  Adequate lighting in your home to reduce risk of falls? Yes   ASSISTIVE DEVICES UTILIZED TO PREVENT FALLS:  Life alert? No  Use of a cane, walker or w/c? No  Grab bars in the bathroom? Yes  Shower chair or bench in shower? No  Elevated toilet seat or a handicapped toilet? No   TIMED UP AND GO:  Was the test performed? No . Phone visit.  Cognitive Function: Normal cognitive status assessed by direct observation by this Nurse Health Advisor. No abnormalities found.   MMSE - Mini Mental State Exam 09/28/2017  Orientation to time 5  Orientation to Place 5  Registration 3  Attention/ Calculation 5  Recall 3  Language- name 2 objects 2  Language- repeat 1  Language- follow 3 step command 3  Language- read & follow direction 1  Write a sentence 1  Copy design 1  Total score 30     6CIT Screen 10/07/2019 10/04/2018  What Year? 0 points 0 points  What month? 0 points 0 points  What time? 0 points 0 points  Count back from 20 0 points 0 points  Months in reverse 0 points 0 points  Repeat phrase 0 points 0 points  Total Score 0 0    Immunizations Immunization History  Administered Date(s) Administered   Tdap 12/08/2007    TDAP status: Due, Education has been provided regarding the importance of this vaccine. Advised may receive this vaccine at local pharmacy or Health Dept. Aware to provide a copy of the vaccination record if  obtained from local pharmacy or Health Dept.  Verbalized acceptance and understanding.  Flu Vaccine status: Due, Education has been provided regarding the importance of this vaccine. Advised may receive this vaccine at local pharmacy or Health Dept. Aware to provide a copy of the vaccination record if obtained from local pharmacy or Health Dept. Verbalized acceptance and understanding.  Pneumococcal vaccine status: Due, Education has been provided regarding the importance of this vaccine. Advised may receive this vaccine at local pharmacy or Health Dept. Aware to provide a copy of the vaccination record if obtained from local pharmacy or Health Dept. Verbalized acceptance and understanding.  Covid-19 vaccine status: Declined, Education has been provided regarding the importance of this vaccine but patient still declined. Advised may receive this vaccine at local pharmacy or Health Dept.or vaccine clinic. Aware to provide a copy of the vaccination record if obtained from local pharmacy or Health Dept. Verbalized acceptance and understanding.  Qualifies for Shingles Vaccine? Yes   Zostavax completed No   Shingrix Completed?: No.    Education has been provided regarding the importance of this vaccine. Patient has been advised to call insurance company to determine out of pocket expense if they have not yet received this vaccine. Advised may also receive vaccine at local pharmacy or Health Dept. Verbalized acceptance and understanding.  Screening Tests Health Maintenance  Topic Date Due   COVID-19 Vaccine (1) Never done   Zoster Vaccines- Shingrix (1 of 2) Never done   COLONOSCOPY (Pts 45-21yr Insurance coverage will need to be confirmed)  02/15/2017   TETANUS/TDAP  12/07/2017   INFLUENZA VACCINE  Never done   PNA vac Low Risk Adult (1 of 2 - PCV13) 07/28/2021 (Originally 01/10/2014)   Hepatitis C Screening  Completed   HPV VACCINES  Aged Out   COLON CANCER SCREENING ANNUAL FOBT  Discontinued    Health Maintenance  Health Maintenance Due   Topic Date Due   COVID-19 Vaccine (1) Never done   Zoster Vaccines- Shingrix (1 of 2) Never done   COLONOSCOPY (Pts 45-45yrInsurance coverage will need to be confirmed)  02/15/2017   TETANUS/TDAP  12/07/2017   INFLUENZA VACCINE  Never done    Colorectal cancer screening: Type of screening: Colonoscopy. Completed 02/16/2012. Repeat every 3-5 years  Lung Cancer Screening: (Low Dose CT Chest recommended if Age 72-80ears, 30 pack-year currently smoking OR have quit w/in 15years.) does not qualify.     Additional Screening:  Hepatitis C Screening: does qualify; Completed 06/14/2020  Vision Screening: Recommended annual ophthalmology exams for early detection of glaucoma and other disorders of the eye. Is the patient up to date with their annual eye exam?  Yes  Who is the provider or what is the name of the office in which the patient attends annual eye exams? [facility], would they like to be referred to a provider to establish care? No .   Dental Screening: Recommended annual dental exams for proper oral hygiene  Community Resource Referral / Chronic Care Management: CRR required this visit?  No   CCM required this visit?  No      Plan:     I have personally reviewed and noted the following in the patient's chart:   Medical and social history Use of alcohol, tobacco or illicit drugs  Current medications and supplements including opioid prescriptions. Patient is not currently taking opioid prescriptions. Functional ability and status Nutritional status Physical activity Advanced directives  List of other physicians Hospitalizations, surgeries, and ER visits in previous 12 months Vitals Screenings to include cognitive, depression, and falls Referrals and appointments  In addition, I have reviewed and discussed with patient certain preventive protocols, quality metrics, and best practice recommendations. A written  personalized care plan for preventive services as well as general preventive health recommendations were provided to patient.     Chriss Driver, LPN   [id number]   Nurse Notes: Last Colonoscopy was 02/16/2012. Pt was to return in 3-5 years. Encouraged patient to call GI to schedule or reach out to [place] if referral is needed. Pt also requests referral for sleep apnea and for new machine. Note sent to Dr. Lajuana Ripple.

## 2020-10-26 ENCOUNTER — Other Ambulatory Visit: Payer: Self-pay | Admitting: Family

## 2020-10-26 DIAGNOSIS — G4733 Obstructive sleep apnea (adult) (pediatric): Secondary | ICD-10-CM

## 2020-11-19 ENCOUNTER — Other Ambulatory Visit: Payer: Self-pay | Admitting: Family

## 2020-12-04 ENCOUNTER — Other Ambulatory Visit: Payer: Self-pay | Admitting: Family

## 2020-12-04 DIAGNOSIS — E559 Vitamin D deficiency, unspecified: Secondary | ICD-10-CM

## 2020-12-08 ENCOUNTER — Other Ambulatory Visit: Payer: Self-pay | Admitting: Family

## 2020-12-08 DIAGNOSIS — E782 Mixed hyperlipidemia: Secondary | ICD-10-CM

## 2020-12-20 ENCOUNTER — Other Ambulatory Visit: Payer: Self-pay | Admitting: Family

## 2020-12-20 NOTE — Telephone Encounter (Signed)
[place]. NTBS 30 days given 11/19/20

## 2020-12-21 ENCOUNTER — Encounter: Payer: Self-pay | Admitting: Family

## 2020-12-21 NOTE — Telephone Encounter (Signed)
Lmom to schedule appt / letter mailed

## 2020-12-24 ENCOUNTER — Ambulatory Visit (INDEPENDENT_AMBULATORY_CARE_PROVIDER_SITE_OTHER): Payer: Medicare HMO | Admitting: Family

## 2020-12-24 ENCOUNTER — Other Ambulatory Visit: Payer: Self-pay

## 2020-12-24 ENCOUNTER — Encounter: Payer: Self-pay | Admitting: Family

## 2020-12-24 VITALS — BP 146/74 | HR 60 | Temp 97.3°F | Ht 75.0 in | Wt 222.4 lb

## 2020-12-24 DIAGNOSIS — I2583 Coronary atherosclerosis due to lipid rich plaque: Secondary | ICD-10-CM

## 2020-12-24 DIAGNOSIS — R400 Somnolence: Secondary | ICD-10-CM | POA: Diagnosis not present

## 2020-12-24 DIAGNOSIS — E782 Mixed hyperlipidemia: Secondary | ICD-10-CM | POA: Diagnosis not present

## 2020-12-24 DIAGNOSIS — E559 Vitamin D deficiency, unspecified: Secondary | ICD-10-CM | POA: Diagnosis not present

## 2020-12-24 DIAGNOSIS — I2510 Atherosclerotic heart disease of native coronary artery without angina pectoris: Secondary | ICD-10-CM

## 2020-12-24 DIAGNOSIS — K219 Gastro-esophageal reflux disease without esophagitis: Secondary | ICD-10-CM

## 2020-12-24 DIAGNOSIS — G4733 Obstructive sleep apnea (adult) (pediatric): Secondary | ICD-10-CM | POA: Diagnosis not present

## 2020-12-24 DIAGNOSIS — R739 Hyperglycemia, unspecified: Secondary | ICD-10-CM | POA: Diagnosis not present

## 2020-12-24 DIAGNOSIS — E039 Hypothyroidism, unspecified: Secondary | ICD-10-CM

## 2020-12-24 DIAGNOSIS — R067 Sneezing: Secondary | ICD-10-CM

## 2020-12-24 DIAGNOSIS — I700 Atherosclerosis of aorta: Secondary | ICD-10-CM

## 2020-12-24 MED ORDER — LEVOTHYROXINE SODIUM 100 MCG PO TABS: ORAL_TABLET | ORAL | 3 refills | Status: DC

## 2020-12-24 MED ORDER — VITAMIN D (ERGOCALCIFEROL) 1.25 MG (50000 UNIT) PO CAPS: 50000.0000 [IU] | ORAL_CAPSULE | ORAL | 0 refills | Status: DC

## 2020-12-24 MED ORDER — LEVOTHYROXINE SODIUM 25 MCG PO TABS: ORAL_TABLET | ORAL | 3 refills | Status: DC

## 2020-12-24 MED ORDER — EZETIMIBE-SIMVASTATIN 10-20 MG PO TABS: 1.0000 | ORAL_TABLET | Freq: Every day | ORAL | 0 refills | Status: DC

## 2020-12-24 MED ORDER — OMEPRAZOLE 20 MG PO CPDR: DELAYED_RELEASE_CAPSULE | ORAL | 3 refills | Status: DC

## 2020-12-24 MED ORDER — FENOFIBRATE 145 MG PO TABS: 145.0000 mg | ORAL_TABLET | Freq: Every day | ORAL | 0 refills | Status: DC

## 2020-12-24 MED ORDER — RANOLAZINE ER 500 MG PO TB12: 500.0000 mg | ORAL_TABLET | Freq: Two times a day (BID) | ORAL | 5 refills | Status: DC

## 2020-12-24 MED ORDER — FLUTICASONE PROPIONATE 50 MCG/ACT NA SUSP: 2.0000 | Freq: Every day | NASAL | 11 refills | Status: DC

## 2020-12-24 NOTE — Patient Instructions (Signed)
Sleep Apnea Sleep apnea is a condition in which breathing pauses or becomes shallow during sleep. People with sleep apnea usually snore loudly. They may have times when they gasp and stop breathing for 10 seconds or more during sleep. This may happen many times during the night. Sleep apnea disrupts your sleep and keeps your body from getting the rest that it needs. This condition can increase your risk of certain health problems, including: Heart attack. Stroke. Obesity. Type 2 diabetes. Heart failure. Irregular heartbeat. High blood pressure. The goal of treatment is to help you breathe normally again. What are the causes? The most common cause of sleep apnea is a collapsed or blocked airway. There are three kinds of sleep apnea: Obstructive sleep apnea. This kind is caused by a blocked or collapsed airway. Central sleep apnea. This kind happens when the part of the brain that controls breathing does not send the correct signals to the muscles that control breathing. Mixed sleep apnea. This is a combination of obstructive and central sleep apnea. What increases the risk? You are more likely to develop this condition if you: Are overweight. Smoke. Have a smaller than normal airway. Are older. Are male. Drink alcohol. Take sedatives or tranquilizers. Have a family history of sleep apnea. Have a tongue or tonsils that are larger than normal. What are the signs or symptoms? Symptoms of this condition include: Trouble staying asleep. Loud snoring. Morning headaches. Waking up gasping. Dry mouth or sore throat in the morning. Daytime sleepiness and tiredness. If you have daytime fatigue because of sleep apnea, you may be more likely to have: Trouble concentrating. Forgetfulness. Irritability or mood swings. Personality changes. Feelings of depression. Sexual dysfunction. This may include loss of interest if you are male, or erectile dysfunction if you are male. How is this  diagnosed? This condition may be diagnosed with: A medical history. A physical exam. A series of tests that are done while you are sleeping (sleep study). These tests are usually done in a sleep lab, but they may also be done at home. How is this treated? Treatment for this condition aims to restore normal breathing and to ease symptoms during sleep. It may involve managing health issues that can affect breathing, such as high blood pressure or obesity. Treatment may include: Sleeping on your side. Using a decongestant if you have nasal congestion. Avoiding the use of depressants, including alcohol, sedatives, and narcotics. Losing weight if you are overweight. Making changes to your diet. Quitting smoking. Using a device to open your airway while you sleep, such as: An oral appliance. This is a custom-made mouthpiece that shifts your lower jaw forward. A continuous positive airway pressure (CPAP) device. This device blows air through a mask when you breathe out (exhale). A nasal expiratory positive airway pressure (EPAP) device. This device has valves that you put into each nostril. A bi-level positive airway pressure (BIPAP) device. This device blows air through a mask when you breathe in (inhale) and breathe out (exhale). Having surgery if other treatments do not work. During surgery, excess tissue is removed to create a wider airway. Follow these instructions at home: Lifestyle Make any lifestyle changes that your health care provider recommends. Eat a healthy, well-balanced diet. Take steps to lose weight if you are overweight. Avoid using depressants, including alcohol, sedatives, and narcotics. Do not use any products that contain nicotine or tobacco. These products include cigarettes, chewing tobacco, and vaping devices, such as e-cigarettes. If you need help quitting, ask your  health care provider. General instructions Take over-the-counter and prescription medicines only as told  by your health care provider. If you were given a device to open your airway while you sleep, use it only as told by your health care provider. If you are having surgery, make sure to tell your health care provider you have sleep apnea. You may need to bring your device with you. Keep all follow-up visits. This is important. Contact a health care provider if: The device that you received to open your airway during sleep is uncomfortable or does not seem to be working. Your symptoms do not improve. Your symptoms get worse. Get help right away if: You develop: Chest pain. Shortness of breath. Discomfort in your back, arms, or stomach. You have: Trouble speaking. Weakness on one side of your body. Drooping in your face. These symptoms may represent a serious problem that is an emergency. Do not wait to see if the symptoms will go away. Get medical help right away. Call your local emergency services (911 in the [place] Do not drive yourself to the hospital. Summary Sleep apnea is a condition in which breathing pauses or becomes shallow during sleep. The most common cause is a collapsed or blocked airway. The goal of treatment is to restore normal breathing and to ease symptoms during sleep. This information is not intended to replace advice given to you by your health care provider. Make sure you discuss any questions you have with your health care provider. Document Revised: 09/05/2020 Document Reviewed: 01/06/2020 Elsevier Patient Education  2022 [employer].

## 2020-12-24 NOTE — Progress Notes (Signed)
Subjective:    Patient ID: Corey Ochs., male    DOB: [birth year], 70 y.o.   MRN: [id number]  Chief Complaint  Patient presents with   Medical Management of Chronic Issues   Pt presents to the office today for chronic follow up.  He is followed by Cardiologists annually for CAD.    He reports he is having day time sleepiness. He has not been using his CPAP.  Gastroesophageal Reflux He complains of belching, heartburn and a hoarse voice. This is a chronic problem. The current episode started more than 1 year ago. The problem occurs occasionally. The problem has been waxing and waning. The symptoms are aggravated by certain foods. Associated symptoms include fatigue. Risk factors include obesity. He has tried a PPI for the symptoms. The treatment provided moderate relief.  Thyroid Problem Presents for follow-up visit. Symptoms include fatigue and hoarse voice. Patient reports no constipation, depressed mood or dry skin. The symptoms have been stable. His past medical history is significant for hyperlipidemia.  Hyperlipidemia This is a chronic problem. The current episode started more than 1 year ago. The problem is uncontrolled. Recent lipid tests were reviewed and are high. Exacerbating diseases include obesity. Current antihyperlipidemic treatment includes statins. The current treatment provides moderate improvement of lipids. Risk factors for coronary artery disease include dyslipidemia, male sex, hypertension, a sedentary lifestyle and obesity.     Review of Systems  Constitutional:  Positive for fatigue.  HENT:  Positive for hoarse voice.   Gastrointestinal:  Positive for heartburn. Negative for constipation.  All other systems reviewed and are negative.     Objective:   Physical Exam Vitals reviewed.  Constitutional:      General: He is not in acute distress.    Appearance: He is well-developed. He is obese.  HENT:     Head: Normocephalic.     Right Ear: Tympanic  membrane normal.     Left Ear: Tympanic membrane normal.  Eyes:     General:        Right eye: No discharge.        Left eye: No discharge.     Pupils: Pupils are equal, round, and reactive to light.  Neck:     Thyroid: No thyromegaly.  Cardiovascular:     Rate and Rhythm: Normal rate and regular rhythm.     Heart sounds: Normal heart sounds. No murmur heard. Pulmonary:     Effort: Pulmonary effort is normal. No respiratory distress.     Breath sounds: Normal breath sounds. No wheezing.  Abdominal:     General: Bowel sounds are normal. There is no distension.     Palpations: Abdomen is soft.     Tenderness: There is no abdominal tenderness.  Musculoskeletal:        General: No tenderness. Normal range of motion.     Cervical back: Normal range of motion and neck supple.  Skin:    General: Skin is warm and dry.     Findings: No erythema or rash.  Neurological:     Mental Status: He is alert and oriented to person, place, and time.     Cranial Nerves: No cranial nerve deficit.     Deep Tendon Reflexes: Reflexes are normal and symmetric.  Psychiatric:        Behavior: Behavior normal.        Thought Content: Thought content normal.        Judgment: Judgment normal.      BP Marland Kitchen)  155/84   Pulse 61   Temp (!) 97.3 F (36.3 C) (Temporal)   Ht 6' 3"  (1.905 m)   Wt 222 lb 6.4 oz (100.9 kg)   BMI 27.80 kg/m      Assessment & Plan:  Corey Ochs. comes in today with chief complaint of Medical Management of Chronic Issues   Diagnosis and orders addressed:  1. Mixed hyperlipidemia - ezetimibe-simvastatin (VYTORIN) 10-20 MG tablet; Take 1 tablet by mouth daily. (NEEDS TO BE SEEN BEFORE NEXT REFILL)  Dispense: 30 tablet; Refill: 0 - CMP14+EGFR - CBC with Differential/Platelet - Lipid panel  2. Vitamin D deficiency - Vitamin D, Ergocalciferol, (DRISDOL) 1.25 MG (50000 UNIT) CAPS capsule; Take 1 capsule (50,000 Units total) by mouth every 7 (seven) days.  Dispense: 12  capsule; Refill: 0 - CMP14+EGFR - CBC with Differential/Platelet  3. Sneezing - fluticasone (FLONASE) 50 MCG/ACT nasal spray; Place 2 sprays into both nostrils daily.  Dispense: 16 g; Refill: 11 - CMP14+EGFR - CBC with Differential/Platelet  4. Gastroesophageal reflux disease without esophagitis - omeprazole (PRILOSEC) 20 MG capsule; Take 2 caps po daily  Dispense: 180 capsule; Refill: 3 - CMP14+EGFR - CBC with Differential/Platelet  5. Coronary artery disease due to lipid rich plaque - ranolazine (RANEXA) 500 MG 12 hr tablet; Take 1 tablet (500 mg total) by mouth 2 (two) times daily.  Dispense: 60 tablet; Refill: 5 - CMP14+EGFR - CBC with Differential/Platelet  6. Acquired hypothyroidism - levothyroxine (SYNTHROID) 100 MCG tablet; TAKE ONE (1) TABLET EACH DAY  Dispense: 90 tablet; Refill: 3 - levothyroxine (SYNTHROID) 25 MCG tablet; Take 0.5 tab on Mon, Wed and Fri mornings before breakfast.  Dispense: 36 tablet; Refill: 3 - CMP14+EGFR - CBC with Differential/Platelet - TSH  7. Aortic atherosclerosis (HCC) - CMP14+EGFR - CBC with Differential/Platelet - Lipid panel  8. Obstructive sleep apnea Needs new sleep study Encouraged  - CMP14+EGFR - CBC with Differential/Platelet - Ambulatory referral to Sleep Studies  9. Daytime sleepiness - Ambulatory referral to Sleep Studies   Labs pending Health Maintenance reviewed Diet and exercise encouraged  Follow up plan: 6 months   Evelina Dun, FNP

## 2020-12-25 ENCOUNTER — Other Ambulatory Visit: Payer: Self-pay | Admitting: Family

## 2020-12-25 LAB — CMP14+EGFR
ALT: 45 IU/L — ABNORMAL HIGH (ref 0–44)
AST: 39 IU/L (ref 0–40)
Albumin/Globulin Ratio: 1.7 (ref 1.2–2.2)
Albumin: 4.6 g/dL (ref 3.7–4.7)
Alkaline Phosphatase: 67 IU/L (ref 44–121)
BUN/Creatinine Ratio: 10 (ref 10–24)
BUN: 12 mg/dL (ref 8–27)
Bilirubin Total: 0.3 mg/dL (ref 0.0–1.2)
CO2: 26 mmol/L (ref 20–29)
Calcium: 9.7 mg/dL (ref 8.6–10.2)
Chloride: 96 mmol/L (ref 96–106)
Creatinine, Ser: 1.24 mg/dL (ref 0.76–1.27)
Globulin, Total: 2.7 g/dL (ref 1.5–4.5)
Glucose: 191 mg/dL — ABNORMAL HIGH (ref 70–99)
Potassium: 4.4 mmol/L (ref 3.5–5.2)
Sodium: 135 mmol/L (ref 134–144)
Total Protein: 7.3 g/dL (ref 6.0–8.5)
eGFR: 62 mL/min/{1.73_m2} (ref >59)

## 2020-12-25 LAB — LIPID PANEL
Chol/HDL Ratio: 8.5 ratio — ABNORMAL HIGH (ref 0.0–5.0)
Cholesterol, Total: 246 mg/dL — ABNORMAL HIGH (ref 100–199)
HDL: 29 mg/dL — ABNORMAL LOW (ref >39)
LDL Chol Calc (NIH): 126 mg/dL — ABNORMAL HIGH (ref 0–99)
Triglycerides: 505 mg/dL — ABNORMAL HIGH (ref 0–149)
VLDL Cholesterol Cal: 91 mg/dL — ABNORMAL HIGH (ref 5–40)

## 2020-12-25 LAB — CBC WITH DIFFERENTIAL/PLATELET
Basophils Absolute: 0.1 10*3/uL (ref 0.0–0.2)
Basos: 1 %
EOS (ABSOLUTE): 0.2 10*3/uL (ref 0.0–0.4)
Eos: 3 %
Hematocrit: 40.2 % (ref 37.5–51.0)
Hemoglobin: 13 g/dL (ref 13.0–17.7)
Immature Grans (Abs): 0 10*3/uL (ref 0.0–0.1)
Immature Granulocytes: 1 %
Lymphocytes Absolute: 1.7 10*3/uL (ref 0.7–3.1)
Lymphs: 29 %
MCH: 28.1 pg (ref 26.6–33.0)
MCHC: 32.3 g/dL (ref 31.5–35.7)
MCV: 87 fL (ref 79–97)
Monocytes Absolute: 0.5 10*3/uL (ref 0.1–0.9)
Monocytes: 9 %
Neutrophils Absolute: 3.4 10*3/uL (ref 1.4–7.0)
Neutrophils: 57 %
Platelets: 306 10*3/uL (ref 150–450)
RBC: 4.62 x10E6/uL (ref 4.14–5.80)
RDW: 13.3 % (ref 11.6–15.4)
WBC: 5.9 10*3/uL (ref 3.4–10.8)

## 2020-12-25 LAB — TSH: TSH: 4.89 u[IU]/mL — ABNORMAL HIGH (ref 0.450–4.500)

## 2020-12-25 MED ORDER — LEVOTHYROXINE SODIUM 112 MCG PO TABS: 112.0000 ug | ORAL_TABLET | Freq: Every day | ORAL | 11 refills | Status: DC

## 2020-12-28 LAB — HGB A1C W/O EAG: Hgb A1c MFr Bld: 7.2 % — ABNORMAL HIGH (ref 4.8–5.6)

## 2020-12-28 LAB — SPECIMEN STATUS REPORT

## 2020-12-31 ENCOUNTER — Other Ambulatory Visit: Payer: Self-pay | Admitting: Family

## 2020-12-31 DIAGNOSIS — E1065 Type 1 diabetes mellitus with hyperglycemia: Secondary | ICD-10-CM

## 2021-01-15 ENCOUNTER — Ambulatory Visit (INDEPENDENT_AMBULATORY_CARE_PROVIDER_SITE_OTHER): Payer: Medicare HMO | Admitting: Pharmacist

## 2021-01-15 DIAGNOSIS — E1165 Type 2 diabetes mellitus with hyperglycemia: Secondary | ICD-10-CM | POA: Insufficient documentation

## 2021-01-15 DIAGNOSIS — G720 Drug-induced myopathy: Secondary | ICD-10-CM | POA: Diagnosis not present

## 2021-01-15 NOTE — Progress Notes (Signed)
    01/15/2021 Name: [name]. MRN: [id number] DOB: 05-28-1950   S:  70 yoF Presents for diabetes evaluation, education, and management.  Patient was referred and last seen by Primary Care Provider on 12/14/20. Patient reports Diabetes was diagnosed in 2022.  Insurance coverage/medication affordability: aetna  Patient reports adherence with medications. Current diabetes medications include: n/a Current hypertension medications include: n/a Goal 130/80 Current hyperlipidemia medications include: vytorin    Patient reported dietary habits: Eats 2-3 meals/day Discussed meal planning options and Plate method for healthy eating Avoid sugary drinks and desserts Incorporate balanced protein, non starchy veggies, 1 serving of carbohydrate with each meal Increase water intake Increase physical activity as able  Patient-reported exercise habits: n/a due to foot  Patient reports self foot exams.    O:  Lab Results  Component Value Date   HGBA1C 7.2 (H) 12/24/2020     Lipid Panel     Component Value Date/Time   CHOL 246 (H) 12/24/2020 1515   CHOL 202 (H) 10/15/2012 0854   TRIG 505 (H) 12/24/2020 1515   TRIG 590 (HH) 06/08/2014 1703   TRIG 241 (H) 10/15/2012 0854   HDL 29 (L) 12/24/2020 1515   HDL 43 06/08/2014 1703   HDL 39 (L) 10/15/2012 0854   CHOLHDL 8.5 (H) 12/24/2020 1515   CHOLHDL 5.9 CALC 01/18/2007 0846   VLDL 55 (H) 01/18/2007 0846   LDLCALC 126 (H) 12/24/2020 1515   LDLCALC 115 (H) 10/15/2012 0854   LDLDIRECT 72 05/13/2016 1100   LDLDIRECT 124.9 01/18/2007 0846    Home fasting blood sugars: n/a new DX  2 hour post-meal/random blood sugars: n/a new DX.    Clinical Atherosclerotic Cardiovascular Disease (ASCVD): No   The 10-year ASCVD risk score (Arnett DK, et al., 2019) is: 60%   Values used to calculate the score:     Age: 8 years     Sex: Male     Is Non-Hispanic African American: No     Diabetic: Yes     Tobacco smoker: No     Systolic  Blood Pressure: 146 mmHg     Is BP treated: Yes     HDL Cholesterol: 29 mg/dL     Total Cholesterol: 246 mg/dL    A/P:  Diabetes t2dm--new diagnosis, A1c is 7.2% Patient would like to focus on dietary and lifestyle changes Healthy plate method given and a1c chart discussed We are also starting Nexlizet in the place of Vytorin--patient experiencing myopathy ICD G72  -Extensively discussed pathophysiology of diabetes, recommended lifestyle interventions, dietary effects on blood sugar control   -Next A1C anticipated 3-6 months.  -Follow up with pharmd in 1 month   Written patient instructions provided.  Total time in face to face counseling 25 minutes.    Regina Eck, PharmD, BCPS Clinical Pharmacist, [facility]  [facility] Phone [phone]

## 2021-01-19 ENCOUNTER — Other Ambulatory Visit: Payer: Self-pay | Admitting: Family

## 2021-02-07 ENCOUNTER — Telehealth: Payer: Self-pay | Admitting: Family

## 2021-02-07 NOTE — Telephone Encounter (Signed)
Left message for pt informing that we did have samples and they have been placed up front for him to pick up.  Per Almyra Free route message to her to see if she can try and get the medication cheaper for pt

## 2021-02-13 ENCOUNTER — Ambulatory Visit (INDEPENDENT_AMBULATORY_CARE_PROVIDER_SITE_OTHER): Payer: Medicare HMO | Admitting: Pharmacist

## 2021-02-13 DIAGNOSIS — E782 Mixed hyperlipidemia: Secondary | ICD-10-CM

## 2021-02-13 DIAGNOSIS — I10 Essential (primary) hypertension: Secondary | ICD-10-CM

## 2021-02-13 MED ORDER — NEXLIZET 180-10 MG PO TABS: 1.0000 | ORAL_TABLET | Freq: Every day | ORAL | 4 refills | Status: DC

## 2021-02-13 NOTE — Progress Notes (Signed)
Chronic Care Management Pharmacy Note  02/13/2021 Name:  [name]. MRN:  [id number] DOB:  20-Mar-1950  Summary: htn, hld  Recommendations/Changes made from today's visit:  Hypertension:  New goal. Uncontrolled; current treatment: not on medication, will discuss case with PCP, HLD was the primary goal of this visit;  Current home readings: 150s/80s Denies hypotensive/hypertensive symptoms Collaborated with pcp--will discuss changes at f/u; start ACEi Hyperlipidemia:  New goal. Uncontrolled; current treatment:nexlizet, fenofibrate for triglycerides;  Tolerating nexlizet well Will apply for [employer] grant for assistance Medications previously tried: statins (atorvastatin)  Current dietary patterns: recommended heart health/Mediterranean  Recommended continue nexlizet; consider repatha if fails; declines statin w/ adjusted regimen Assessed patient finances. [employer] grant Lipid Panel     Component Value Date/Time   CHOL 246 (H) 12/24/2020 1515   CHOL 202 (H) 10/15/2012 0854   TRIG 505 (H) 12/24/2020 1515   TRIG 590 (HH) 06/08/2014 1703   TRIG 241 (H) 10/15/2012 0854   HDL 29 (L) 12/24/2020 1515   HDL 43 06/08/2014 1703   HDL 39 (L) 10/15/2012 0854   CHOLHDL 8.5 (H) 12/24/2020 1515   CHOLHDL 5.9 CALC 01/18/2007 0846   VLDL 55 (H) 01/18/2007 0846   LDLCALC 126 (H) 12/24/2020 1515   LDLCALC 115 (H) 10/15/2012 0854   LDLDIRECT 72 05/13/2016 1100   LDLDIRECT 124.9 01/18/2007 0846   LABVLDL 91 (H) 12/24/2020 1515     Patient Goals/Self-Care Activities patient will:  - take medications as prescribed as evidenced by patient report and record review collaborate with provider on medication access solutions target a minimum of 150 minutes of moderate intensity exercise weekly engage in dietary modifications by heart healthy diet/mediterranean diet  Plan: f/u 02/28/21  Subjective: Corey Ochs. is an 71 y.o. year old male who is a  primary patient of Sharion Balloon, FNP.  The CCM team was consulted for assistance with disease management and care coordination needs.    Engaged with patient by telephone for initial visit in response to provider referral for pharmacy case management and/or care coordination services.   Consent to Services:  The patient was given information about Chronic Care Management services, agreed to services, and gave verbal consent prior to initiation of services.  Please see initial visit note for detailed documentation.   Patient Care Team: Sharion Balloon, FNP as PCP - General (Family Medicine) Hilarie Fredrickson, Lajuan Lines, MD as Consulting Physician (Gastroenterology) Laurin Coder, MD as Consulting Physician (Pulmonary Disease) Minus Breeding, MD as Consulting Physician (Cardiology) Lavera Guise, [facility] as Pharmacist (Family Medicine)  Objective:  Lab Results  Component Value Date   CREATININE 1.24 12/24/2020   CREATININE 0.82 06/14/2020   CREATININE 0.80 09/27/2019    Lab Results  Component Value Date   HGBA1C 7.2 (H) 12/24/2020   Last diabetic Eye exam: No results found for: HMDIABEYEEXA  Last diabetic Foot exam: No results found for: HMDIABFOOTEX      Component Value Date/Time   CHOL 246 (H) 12/24/2020 1515   CHOL 202 (H) 10/15/2012 0854   TRIG 505 (H) 12/24/2020 1515   TRIG 590 (HH) 06/08/2014 1703   TRIG 241 (H) 10/15/2012 0854   HDL 29 (L) 12/24/2020 1515   HDL 43 06/08/2014 1703   HDL 39 (L) 10/15/2012 0854   CHOLHDL 8.5 (H) 12/24/2020 1515   CHOLHDL 5.9 CALC 01/18/2007 0846   VLDL 55 (H) 01/18/2007 0846   LDLCALC 126 (H) 12/24/2020 1515   LDLCALC 115 (H)  10/15/2012 0854   LDLDIRECT 72 05/13/2016 1100   LDLDIRECT 124.9 01/18/2007 0846    Hepatic Function Latest Ref Rng & Units 12/24/2020 06/14/2020 09/27/2019  Total Protein 6.0 - 8.5 g/dL 7.3 7.5 7.7  Albumin 3.7 - 4.7 g/dL 4.6 4.9(H) 4.7  AST 0 - 40 IU/L 39 27 26  ALT 0 - 44 IU/L 45(H) 32 29  Alk Phosphatase 44 -  121 IU/L 67 92 88  Total Bilirubin 0.0 - 1.2 mg/dL 0.3 0.4 0.4  Bilirubin, Direct 0.00 - 0.40 mg/dL - - -    Lab Results  Component Value Date/Time   TSH 4.890 (H) 12/24/2020 03:15 PM   TSH 4.510 (H) 06/14/2020 01:09 PM    CBC Latest Ref Rng & Units 12/24/2020 06/14/2020 09/27/2019  WBC 3.4 - 10.8 x10E3/uL 5.9 5.7 6.4  Hemoglobin 13.0 - 17.7 g/dL 13.0 13.5 14.4  Hematocrit 37.5 - 51.0 % 40.2 40.6 43.0  Platelets 150 - 450 x10E3/uL 306 275 278    Lab Results  Component Value Date/Time   VD25OH 36.0 06/14/2020 01:09 PM   VD25OH 34.1 03/29/2019 03:35 PM    Clinical ASCVD: No  The 10-year ASCVD risk score (Arnett DK, et al., 2019) is: 60%   Values used to calculate the score:     Age: 28 years     Sex: Male     Is Non-Hispanic African American: No     Diabetic: Yes     Tobacco smoker: No     Systolic Blood Pressure: 409 mmHg     Is BP treated: Yes     HDL Cholesterol: 29 mg/dL     Total Cholesterol: 246 mg/dL    Other: (CHADS2VASc if Afib, PHQ9 if depression, MMRC or CAT for COPD, ACT, DEXA)  Social History   Tobacco Use  Smoking Status Former   Packs/day: 0.50   Types: Cigarettes   Quit date: 07/22/1986   Years since quitting: 34.6  Smokeless Tobacco Never   BP Readings from Last 3 Encounters:  12/24/20 (!) 146/74  06/14/20 138/77  01/07/20 (!) 150/94   Pulse Readings from Last 3 Encounters:  12/24/20 60  06/14/20 66  01/07/20 60   Wt Readings from Last 3 Encounters:  12/24/20 222 lb 6.4 oz (100.9 kg)  10/12/20 218 lb (98.9 kg)  06/14/20 218 lb 12.8 oz (99.2 kg)    Assessment: Review of patient past medical history, allergies, medications, health status, including review of consultants reports, laboratory and other test data, was performed as part of comprehensive evaluation and provision of chronic care management services.   SDOH:  (Social Determinants of Health) assessments and interventions performed:    CCM Care Plan  Allergies  Allergen  Reactions   Imdur [Isosorbide Nitrate]     Headache    Lipitor [Atorvastatin] Other (See Comments)    myalgias    Medications Reviewed Today     Reviewed by Lavera Guise, [facility] (Pharmacist) on 02/17/21 at [place] List Status: <None>   Medication Order Taking? Sig Documenting Provider Last Dose Status Informant  aspirin 81 MG EC tablet [id number] No Take 81 mg by mouth daily. [provider] Taking Active   Bempedoic Acid-Ezetimibe (NEXLIZET) 180-10 MG TABS [id number] Yes Take 1 tablet by mouth daily. Evelina Dun A, FNP  Active   fenofibrate (TRICOR) 145 MG tablet [id number]  TAKE ONE (1) TABLET BY MOUTH EVERY DAY Evelina Dun A, FNP  Active   fluticasone (FLONASE) 50 MCG/ACT nasal spray [id number]  Place  2 sprays into both nostrils daily. Evelina Dun A, FNP  Active   levothyroxine (SYNTHROID) 112 MCG tablet [id number]  Take 1 tablet (112 mcg total) by mouth daily. Sharion Balloon, FNP  Active   omeprazole (PRILOSEC) 20 MG capsule [id number]  Take 2 caps po daily Hawks, [place] A, FNP  Active   ranolazine (RANEXA) 500 MG 12 hr tablet [id number]  Take 1 tablet (500 mg total) by mouth 2 (two) times daily. Hawks, Alyse Low A, FNP  Active   Vitamin D, Ergocalciferol, (DRISDOL) 1.25 MG (50000 UNIT) CAPS capsule [id number]  Take 1 capsule (50,000 Units total) by mouth every 7 (seven) days. Sharion Balloon, FNP  Active             Patient Active Problem List   Diagnosis Date Noted   Type 2 diabetes mellitus with hyperglycemia ([place]) 93/90/3009   Umbilical hernia without obstruction and without gangrene 06/14/2020   Function kidney decreased 12/22/2018   Vitamin D deficiency 09/15/2018   Trigger middle finger of right hand 09/15/2018   Obstructive sleep apnea 10/14/2017   Aortic atherosclerosis ([place]) 08/05/2016   Diverticulosis of colon (without mention of hemorrhage)    Mixed hyperlipidemia    Benign neoplasm of colon    COLONIC POLYPS 06/12/2007   Hypothyroidism 06/12/2007    Coronary artery disease 06/12/2007   Coronary artery disease involving native coronary artery of native heart with angina pectoris with documented spasm ([place]) 06/12/2007   GERD 06/12/2007   Diverticulosis of colon 06/12/2007   GUAIAC POSITIVE STOOL 06/12/2007    Immunization History  Administered Date(s) Administered   Tdap 12/08/2007    Conditions to be addressed/monitored: HTN, HLD, and DMII  Care Plan : PHARMD MEDICATION MANAGEMENT  Updates made by Lavera Guise, RPH since 02/17/2021 12:00 AM     Problem: DISEASE PROGRESSION PREVENTION      Long-Range Goal: HLD, HTN PHAMRD GOAL   This Visit's Progress: Not on track  Priority: High  Note:   Current Barriers:  Unable to independently afford treatment regimen Unable to achieve control of hyperlipidemia, hypertension  Suboptimal therapeutic regimen for hyperlipidemia, hypertension   Pharmacist Clinical Goal(s):  patient will verbalize ability to afford treatment regimen achieve control of HLD, HTN as evidenced by GOALS MET-LDL<70, BP <130/80 adhere to plan to optimize therapeutic regimen for HLD, HTN as evidenced by report of adherence to recommended medication management changes through collaboration with PharmD and provider.   Interventions: 1:1 collaboration with Sharion Balloon, FNP regarding development and update of comprehensive plan of care as evidenced by provider attestation and co-signature Inter-disciplinary care team collaboration (see longitudinal plan of care) Comprehensive medication review performed; medication list updated in electronic medical record  Hypertension:  New goal. Uncontrolled; current treatment: not on medication, will discuss case with PCP, HLD was the primary goal of this visit;  Current home readings: 150s/80s Denies hypotensive/hypertensive symptoms Collaborated with pcp--will discuss changes at f/u; start ACEi Hyperlipidemia:  New goal. Uncontrolled; current treatment:nexlizet,  fenofibrate for triglycerides;  Tolerating nexlizet well Will apply for [employer] grant for assistance Medications previously tried: statins (atorvastatin)  Current dietary patterns: recommended heart health/Mediterranean  Recommended continue nexlizet; consider repatha if fails; declines statin w/ adjusted regimen Assessed patient finances. [place]  Patient Goals/Self-Care Activities patient will:  - take medications as prescribed as evidenced by patient report and record review collaborate with provider on medication access solutions target a minimum of 150 minutes of moderate intensity exercise weekly engage in dietary  modifications by heart healthy diet/mediterranean diet      Medication Assistance:  [employer] for nexlizet  Patient's preferred pharmacy is:  [facility], [place] [address] Phone: [phone] Fax: [phone]   Follow Up:  Patient agrees to Care Plan and Follow-up.  Plan: Telephone follow up appointment with care management team member scheduled for:  02/28/21   Regina Eck, PharmD, BCPS Clinical Pharmacist, [place]  II Phone [phone]

## 2021-02-13 NOTE — Patient Instructions (Signed)
Visit Information  Thank you for taking time to visit with me today. Please don't hesitate to contact me if I can be of assistance to you before our next scheduled telephone appointment.  Following are the goals we discussed today:  Current Barriers:  Unable to independently afford treatment regimen Unable to achieve control of hyperlipidemia, hypertension  Suboptimal therapeutic regimen for hyperlipidemia, hypertension   Pharmacist Clinical Goal(s):  patient will verbalize ability to afford treatment regimen achieve control of HLD, HTN as evidenced by GOALS MET-LDL<70, BP <130/80 adhere to plan to optimize therapeutic regimen for HLD, HTN as evidenced by report of adherence to recommended medication management changes through collaboration with PharmD and provider.   Interventions: 1:1 collaboration with Sharion Balloon, FNP regarding development and update of comprehensive plan of care as evidenced by provider attestation and co-signature Inter-disciplinary care team collaboration (see longitudinal plan of care) Comprehensive medication review performed; medication list updated in electronic medical record  Hypertension:  New goal. Uncontrolled; current treatment: not on medication, will discuss case with PCP, HLD was the primary goal of this visit;  Current home readings: 150s/80s Denies hypotensive/hypertensive symptoms Collaborated with pcp--will discuss changes at f/u; start ACEi Hyperlipidemia:  New goal. Uncontrolled; current treatment:nexlizet, fenofibrate for triglycerides;  Tolerating nexlizet well Will apply for [employer]well foundation grant for assistance Medications previously tried: statins (atorvastatin)  Current dietary patterns: recommended heart health/Mediterranean  Recommended continue nexlizet; consider repatha if fails; declines statin w/ adjusted regimen Assessed patient finances. [place]  Patient Goals/Self-Care Activities patient will:   - take medications as prescribed as evidenced by patient report and record review collaborate with provider on medication access solutions target a minimum of 150 minutes of moderate intensity exercise weekly engage in dietary modifications by heart healthy diet/mediterranean diet   Please call the care guide team at [phone] if you need to cancel or reschedule your appointment.   The patient verbalized understanding of instructions, educational materials, and care plan provided today and declined offer to receive copy of patient instructions, educational materials, and care plan.    Signature Regina Eck, PharmD, BCPS Clinical Pharmacist, [place]  II Phone [phone]

## 2021-02-28 ENCOUNTER — Telehealth: Payer: Self-pay | Admitting: Family

## 2021-02-28 ENCOUNTER — Ambulatory Visit: Payer: Medicare HMO | Admitting: Pharmacist

## 2021-02-28 DIAGNOSIS — G720 Drug-induced myopathy: Secondary | ICD-10-CM

## 2021-02-28 DIAGNOSIS — H04123 Dry eye syndrome of bilateral lacrimal glands: Secondary | ICD-10-CM | POA: Diagnosis not present

## 2021-02-28 DIAGNOSIS — H26493 Other secondary cataract, bilateral: Secondary | ICD-10-CM | POA: Diagnosis not present

## 2021-02-28 DIAGNOSIS — H40013 Open angle with borderline findings, low risk, bilateral: Secondary | ICD-10-CM | POA: Diagnosis not present

## 2021-02-28 DIAGNOSIS — I10 Essential (primary) hypertension: Secondary | ICD-10-CM

## 2021-02-28 DIAGNOSIS — H35373 Puckering of macula, bilateral: Secondary | ICD-10-CM | POA: Diagnosis not present

## 2021-02-28 DIAGNOSIS — E782 Mixed hyperlipidemia: Secondary | ICD-10-CM

## 2021-02-28 NOTE — Telephone Encounter (Signed)
Pt said he looked and he does not have the card that was dicussed.

## 2021-03-07 ENCOUNTER — Institutional Professional Consult (permissible substitution): Payer: Medicare HMO | Admitting: Internal Medicine

## 2021-03-12 DIAGNOSIS — I10 Essential (primary) hypertension: Secondary | ICD-10-CM

## 2021-03-12 DIAGNOSIS — E782 Mixed hyperlipidemia: Secondary | ICD-10-CM

## 2021-03-17 NOTE — Progress Notes (Signed)
Chronic Care Management Pharmacy Note 02/28/2021 Name:  [name]. MRN:  [id number] DOB:  [birth year]  Summary: HLD, HTN  Recommendations/Changes made from today's visit:  Hypertension:  Condition stable. Not addressed this visit. Uncontrolled; current treatment: not on medication, will discuss case with PCP, HLD was the primary goal of this visit;  Current home readings: 150s/80s Denies hypotensive/hypertensive symptoms Collaborated with pcp--will discuss changes at f/u; start ACEi Hyperlipidemia:  New goal. Uncontrolled; current treatment: Nexlizet, fenofibrate for triglycerides;  Tolerating nexlizet well Will apply for [employer] foundation grant for assistance  Medications previously tried: statins (atorvastatin)  Current dietary patterns: recommended heart health/Mediterranean  Recommended continue nexlizet; consider repatha if fails; declines statin w/ adjusted regimen Assessed patient finances. [employer] grant--APPLICATION SUBMITTED AGAIN ON 03/17/21--UNSURE WHY ORIGINAL APPLICATION DIDN'T GO THROUGH IN January--> WILL FOLLOW ICD 10: G72 documented for statin induced myopathy  Lipid Panel     Component Value Date/Time   CHOL 246 (H) 12/24/2020 1515   CHOL 202 (H) 10/15/2012 0854   TRIG 505 (H) 12/24/2020 1515   TRIG 590 (HH) 06/08/2014 1703   TRIG 241 (H) 10/15/2012 0854   HDL 29 (L) 12/24/2020 1515   HDL 43 06/08/2014 1703   HDL 39 (L) 10/15/2012 0854   CHOLHDL 8.5 (H) 12/24/2020 1515   CHOLHDL 5.9 CALC 01/18/2007 0846   VLDL 55 (H) 01/18/2007 0846   LDLCALC 126 (H) 12/24/2020 1515   LDLCALC 115 (H) 10/15/2012 0854   LDLDIRECT 72 05/13/2016 1100   LDLDIRECT 124.9 01/18/2007 0846   LABVLDL 91 (H) 12/24/2020 1515   Patient Goals/Self-Care Activities patient will:  - take medications as prescribed as evidenced by patient report and record review collaborate with provider on medication access solutions target a minimum of 150 minutes of  moderate intensity exercise weekly engage in dietary modifications by heart healthy diet/mediterranean diet  Plan: F/U IN 3 MONTHS  Subjective: [name]. is an 71 y.o. year old male who is a primary patient of Sharion Balloon, FNP.  The CCM team was consulted for assistance with disease management and care coordination needs.    Engaged with patient by telephone for follow up visit in response to provider referral for pharmacy case management and/or care coordination services.   Consent to Services:  The patient was given information about Chronic Care Management services, agreed to services, and gave verbal consent prior to initiation of services.  Please see initial visit note for detailed documentation.   Patient Care Team: Sharion Balloon, FNP as PCP - General (Family Medicine) Hilarie Fredrickson, Lajuan Lines, MD as Consulting Physician (Gastroenterology) Laurin Coder, MD as Consulting Physician (Pulmonary Disease) Minus Breeding, MD as Consulting Physician (Cardiology) Lavera Guise, [facility] as Pharmacist (Family Medicine)  Objective:  Lab Results  Component Value Date   CREATININE 1.24 12/24/2020   CREATININE 0.82 06/14/2020   CREATININE 0.80 09/27/2019   Lab Results  Component Value Date   HGBA1C 7.2 (H) 12/24/2020      Component Value Date/Time   CHOL 246 (H) 12/24/2020 1515   CHOL 202 (H) 10/15/2012 0854   TRIG 505 (H) 12/24/2020 1515   TRIG 590 (HH) 06/08/2014 1703   TRIG 241 (H) 10/15/2012 0854   HDL 29 (L) 12/24/2020 1515   HDL 43 06/08/2014 1703   HDL 39 (L) 10/15/2012 0854   CHOLHDL 8.5 (H) 12/24/2020 1515   CHOLHDL 5.9 CALC 01/18/2007 0846   VLDL 55 (H) 01/18/2007 0846   LDLCALC 126 (H)  12/24/2020 1515   LDLCALC 115 (H) 10/15/2012 0854   LDLDIRECT 72 05/13/2016 1100   LDLDIRECT 124.9 01/18/2007 0846    Hepatic Function Latest Ref Rng & Units 12/24/2020 06/14/2020 09/27/2019  Total Protein 6.0 - 8.5 g/dL 7.3 7.5 7.7  Albumin 3.7 - 4.7 g/dL 4.6 4.9(H)  4.7  AST 0 - 40 IU/L 39 27 26  ALT 0 - 44 IU/L 45(H) 32 29  Alk Phosphatase 44 - 121 IU/L 67 92 88  Total Bilirubin 0.0 - 1.2 mg/dL 0.3 0.4 0.4  Bilirubin, Direct 0.00 - 0.40 mg/dL - - -   Clinical ASCVD: No  The 10-year ASCVD risk score (Arnett DK, et al., 2019) is: 60%   Values used to calculate the score:     Age: 74 years     Sex: Male     Is Non-Hispanic African American: No     Diabetic: Yes     Tobacco smoker: No     Systolic Blood Pressure: 937 mmHg     Is BP treated: Yes     HDL Cholesterol: 29 mg/dL     Total Cholesterol: 246 mg/dL    Other: (CHADS2VASc if Afib, PHQ9 if depression, MMRC or CAT for COPD, ACT, DEXA)  Social History   Tobacco Use  Smoking Status Former   Packs/day: 0.50   Types: Cigarettes   Quit date: 07/22/1986   Years since quitting: 34.6  Smokeless Tobacco Never   BP Readings from Last 3 Encounters:  12/24/20 (!) 146/74  06/14/20 138/77  01/07/20 (!) 150/94   Pulse Readings from Last 3 Encounters:  12/24/20 60  06/14/20 66  01/07/20 60   Wt Readings from Last 3 Encounters:  12/24/20 222 lb 6.4 oz (100.9 kg)  10/12/20 218 lb (98.9 kg)  06/14/20 218 lb 12.8 oz (99.2 kg)    Assessment: Review of patient past medical history, allergies, medications, health status, including review of consultants reports, laboratory and other test data, was performed as part of comprehensive evaluation and provision of chronic care management services.   SDOH:  (Social Determinants of Health) assessments and interventions performed:    CCM Care Plan  Allergies  Allergen Reactions   Imdur [Isosorbide Nitrate]     Headache    Lipitor [Atorvastatin] Other (See Comments)    myalgias    Medications Reviewed Today     Reviewed by Lavera Guise, [facility] (Pharmacist) on 03/17/21 at [place] List Status: <None>   Medication Order Taking? Sig Documenting Provider Last Dose Status Informant  aspirin 81 MG EC tablet [id number] No Take 81 mg by mouth daily.  [provider] Taking Active   Bempedoic Acid-Ezetimibe (NEXLIZET) 180-10 MG TABS [id number]  Take 1 tablet by mouth daily. Evelina Dun A, FNP  Active   fenofibrate (TRICOR) 145 MG tablet [id number]  TAKE ONE (1) TABLET BY MOUTH EVERY DAY Hawks, Christy A, FNP  Active   fluticasone (FLONASE) 50 MCG/ACT nasal spray [id number]  Place 2 sprays into both nostrils daily. Evelina Dun A, FNP  Active   levothyroxine (SYNTHROID) 112 MCG tablet [id number]  Take 1 tablet (112 mcg total) by mouth daily. Sharion Balloon, FNP  Active   omeprazole (PRILOSEC) 20 MG capsule [id number]  Take 2 caps po daily Hawks, [place] A, FNP  Active   ranolazine (RANEXA) 500 MG 12 hr tablet [id number]  Take 1 tablet (500 mg total) by mouth 2 (two) times daily. Sharion Balloon, FNP  Active   Vitamin D, Ergocalciferol, (  DRISDOL) 1.25 MG (50000 UNIT) CAPS capsule [id number]  Take 1 capsule (50,000 Units total) by mouth every 7 (seven) days. Sharion Balloon, FNP  Active             Patient Active Problem List   Diagnosis Date Noted   Type 2 diabetes mellitus with hyperglycemia ([place]) 01/29/7587   Umbilical hernia without obstruction and without gangrene 06/14/2020   Function kidney decreased 12/22/2018   Vitamin D deficiency 09/15/2018   Trigger middle finger of right hand 09/15/2018   Obstructive sleep apnea 10/14/2017   Aortic atherosclerosis ([place]) 08/05/2016   Diverticulosis of colon (without mention of hemorrhage)    Mixed hyperlipidemia    Benign neoplasm of colon    COLONIC POLYPS 06/12/2007   Hypothyroidism 06/12/2007   Coronary artery disease 06/12/2007   Coronary artery disease involving native coronary artery of native heart with angina pectoris with documented spasm ([place]) 06/12/2007   GERD 06/12/2007   Diverticulosis of colon 06/12/2007   GUAIAC POSITIVE STOOL 06/12/2007    Immunization History  Administered Date(s) Administered   Tdap 12/08/2007    Conditions to be  addressed/monitored: HTN and HLD  Care Plan : PHARMD MEDICATION MANAGEMENT  Updates made by Lavera Guise, RPH since 03/17/2021 12:00 AM     Problem: DISEASE PROGRESSION PREVENTION      Long-Range Goal: HLD, HTN PHAMRD GOAL   Recent Progress: Not on track  Priority: High  Note:   Current Barriers:  Unable to independently afford treatment regimen Unable to achieve control of hyperlipidemia, hypertension  Suboptimal therapeutic regimen for hyperlipidemia, hypertension  Pharmacist Clinical Goal(s):  patient will verbalize ability to afford treatment regimen achieve control of HLD, HTN as evidenced by GOALS MET-LDL<70, BP <130/80 adhere to plan to optimize therapeutic regimen for HLD, HTN as evidenced by report of adherence to recommended medication management changes through collaboration with PharmD and provider.   Interventions: 1:1 collaboration with Sharion Balloon, FNP regarding development and update of comprehensive plan of care as evidenced by provider attestation and co-signature Inter-disciplinary care team collaboration (see longitudinal plan of care) Comprehensive medication review performed; medication list updated in electronic medical record  Hypertension:  Condition stable. Not addressed this visit. Uncontrolled; current treatment: not on medication, will discuss case with PCP, HLD was the primary goal of this visit;  Current home readings: 150s/80s Denies hypotensive/hypertensive symptoms Collaborated with pcp--will discuss changes at f/u; start ACEi Hyperlipidemia:  New goal. Uncontrolled; current treatment: Nexlizet, fenofibrate for triglycerides;  Tolerating nexlizet well Will apply for healthwell foundation grant for assistance  Medications previously tried: statins (atorvastatin)  Current dietary patterns: recommended heart health/Mediterranean  Recommended continue nexlizet; consider repatha if fails; declines statin w/ adjusted regimen Assessed patient  finances. [employer] grant--APPLICATION SUBMITTED AGAIN ON 03/17/21--UNSURE WHY ORIGINAL APPLICATION DIDN'T GO THROUGH IN January--> WILL FOLLOW ICD 10: G72 documented for statin induced myopathy  Lipid Panel     Component Value Date/Time   CHOL 246 (H) 12/24/2020 1515   CHOL 202 (H) 10/15/2012 0854   TRIG 505 (H) 12/24/2020 1515   TRIG 590 (HH) 06/08/2014 1703   TRIG 241 (H) 10/15/2012 0854   HDL 29 (L) 12/24/2020 1515   HDL 43 06/08/2014 1703   HDL 39 (L) 10/15/2012 0854   CHOLHDL 8.5 (H) 12/24/2020 1515   CHOLHDL 5.9 CALC 01/18/2007 0846   VLDL 55 (H) 01/18/2007 0846   LDLCALC 126 (H) 12/24/2020 1515   LDLCALC 115 (H) 10/15/2012 0854   LDLDIRECT 72 05/13/2016 1100  LDLDIRECT 124.9 01/18/2007 0846   LABVLDL 91 (H) 12/24/2020 1515   Patient Goals/Self-Care Activities patient will:  - take medications as prescribed as evidenced by patient report and record review collaborate with provider on medication access solutions target a minimum of 150 minutes of moderate intensity exercise weekly engage in dietary modifications by heart healthy diet/mediterranean diet      Medication Assistance:  [place])  Patient's preferred pharmacy is:  [facility] [address] [place] [address] Phone: [phone] Fax: [phone]  Follow Up:  Patient agrees to Care Plan and Follow-up.  Plan: Telephone follow up appointment with care management team member scheduled for:  3 MONTHS   Regina Eck, PharmD, BCPS Clinical Pharmacist, [place]  II Phone [phone]

## 2021-03-17 NOTE — Patient Instructions (Signed)
Visit Information  Following are the goals we discussed today:  Current Barriers:  Unable to independently afford treatment regimen Unable to achieve control of hyperlipidemia, hypertension  Suboptimal therapeutic regimen for hyperlipidemia, hypertension  Pharmacist Clinical Goal(s):  patient will verbalize ability to afford treatment regimen achieve control of HLD, HTN as evidenced by GOALS MET-LDL<70, BP <130/80 adhere to plan to optimize therapeutic regimen for HLD, HTN as evidenced by report of adherence to recommended medication management changes through collaboration with PharmD and provider.   Interventions: 1:1 collaboration with Sharion Balloon, FNP regarding development and update of comprehensive plan of care as evidenced by provider attestation and co-signature Inter-disciplinary care team collaboration (see longitudinal plan of care) Comprehensive medication review performed; medication list updated in electronic medical record  Hypertension:  Condition stable. Not addressed this visit. Uncontrolled; current treatment: not on medication, will discuss case with PCP, HLD was the primary goal of this visit;  Current home readings: 150s/80s Denies hypotensive/hypertensive symptoms Collaborated with pcp--will discuss changes at f/u; start ACEi Hyperlipidemia:  New goal. Uncontrolled; current treatment: Nexlizet, fenofibrate for triglycerides;  Tolerating nexlizet well Will apply for healthwell foundation grant for assistance  Medications previously tried: statins (atorvastatin)  Current dietary patterns: recommended heart health/Mediterranean  Recommended continue nexlizet; consider repatha if fails; declines statin w/ adjusted regimen Assessed patient finances. [place] ON 03/17/21--UNSURE WHY ORIGINAL APPLICATION DIDN'T GO THROUGH IN January--> WILL FOLLOW ICD 10: G72 documented for statin induced myopathy  Lipid Panel      Component Value Date/Time   CHOL 246 (H) 12/24/2020 1515   CHOL 202 (H) 10/15/2012 0854   TRIG 505 (H) 12/24/2020 1515   TRIG 590 (HH) 06/08/2014 1703   TRIG 241 (H) 10/15/2012 0854   HDL 29 (L) 12/24/2020 1515   HDL 43 06/08/2014 1703   HDL 39 (L) 10/15/2012 0854   CHOLHDL 8.5 (H) 12/24/2020 1515   CHOLHDL 5.9 CALC 01/18/2007 0846   VLDL 55 (H) 01/18/2007 0846   LDLCALC 126 (H) 12/24/2020 1515   LDLCALC 115 (H) 10/15/2012 0854   LDLDIRECT 72 05/13/2016 1100   LDLDIRECT 124.9 01/18/2007 0846   LABVLDL 91 (H) 12/24/2020 1515    Patient Goals/Self-Care Activities patient will:  - take medications as prescribed as evidenced by patient report and record review collaborate with provider on medication access solutions target a minimum of 150 minutes of moderate intensity exercise weekly engage in dietary modifications by heart healthy diet/mediterranean diet   Plan: Telephone follow up appointment with care management team member scheduled for:  3 MONTHS  Signature Regina Eck, PharmD, BCPS Clinical Pharmacist, [place]  II Phone [phone]   Please call the care guide team at [phone] if you need to cancel or reschedule your appointment.   The patient verbalized understanding of instructions, educational materials, and care plan provided today and declined offer to receive copy of patient instructions, educational materials, and care plan.

## 2021-03-21 ENCOUNTER — Telehealth: Payer: Medicare HMO

## 2021-03-21 ENCOUNTER — Telehealth: Payer: Self-pay | Admitting: Family

## 2021-03-21 ENCOUNTER — Telehealth: Payer: Self-pay | Admitting: *Deleted

## 2021-03-21 NOTE — Telephone Encounter (Signed)
NEXLIZET Tablet Type of coverage approved: Non-Formulary This approval authorizes your coverage from 02/10/2021 - 02/09/2022  Drug store aware,

## 2021-03-21 NOTE — Telephone Encounter (Signed)
Needs PA. Patient aware samples up front

## 2021-03-21 NOTE — Telephone Encounter (Signed)
Samad Cuny Key: [id number] - PA Case ID: [id number] - Rx #: [id number] Need help? Call us at [phone] Status Sent to [place] 180-10MG  tablets Form [employer] Electronic PA Form [id number] NCPDP)

## 2021-03-22 ENCOUNTER — Telehealth: Payer: Self-pay | Admitting: Family

## 2021-03-22 ENCOUNTER — Encounter: Payer: Self-pay | Admitting: Nurse Practitioner

## 2021-03-22 ENCOUNTER — Ambulatory Visit (INDEPENDENT_AMBULATORY_CARE_PROVIDER_SITE_OTHER): Payer: Medicare HMO | Admitting: Nurse Practitioner

## 2021-03-22 DIAGNOSIS — R051 Acute cough: Secondary | ICD-10-CM | POA: Diagnosis not present

## 2021-03-22 MED ORDER — GUAIFENESIN 100 MG/5ML PO LIQD: 5.0000 mL | ORAL | 0 refills | Status: DC | PRN

## 2021-03-22 NOTE — Progress Notes (Signed)
Virtual Visit  Note Due to COVID-19 pandemic this visit was conducted virtually. This visit type was conducted due to national recommendations for restrictions regarding the COVID-19 Pandemic (e.g. social distancing, sheltering in place) in an effort to limit this patient's exposure and mitigate transmission in our community. All issues noted in this document were discussed and addressed.  A physical exam was not performed with this format.  I connected with [name]. on 03/22/21 at 2:43 am by telephone and verified that I am speaking with the correct person using two identifiers. [name]. is currently located at home during visit. The provider, Ivy Lynn, NP is located in their office at time of visit.  I discussed the limitations, risks, security and privacy concerns of performing an evaluation and management service by telephone and the availability of in person appointments. I also discussed with the patient that there may be a patient responsible charge related to this service. The patient expressed understanding and agreed to proceed.   History and Present Illness:  HPI   Cough: Patient complains of  cough .  Symptoms began  yesterday  .  The cough is non-productive, without wheezing, dyspnea or hemoptysis and is aggravated by nothing Associated symptoms include: nothing . Patient does not have new pets. Patient does not have a history of asthma. Patient does not have a history of environmental allergens. Patient does not have recent travel. Patient does not have a history of smoking. Patient  did not have previous Chest X-ray. Patient has not had a PPD done.  Patient reports symptoms started 24 hours and has improved but will like a Z-pack ordered incase he needs it. Education provided to patient that z-pack is not prescribed to be used as needed as symptoms have resolved and the best  treatment for his cough is tessalon pearls or other cough suppressant. Patient  prefers to uses robafen a medication that was prescribed in the past and worked for his cough.  I also provided education to patient that using z-pack for his age could be dangerous as it elongates QRS interval  Review of Systems  Constitutional: Negative.   HENT: Negative.    Respiratory:  Positive for cough.   Cardiovascular: Negative.   Genitourinary: Negative.   Musculoskeletal: Negative.   Skin: Negative.   Neurological: Negative.   Psychiatric/Behavioral: Negative.    All other systems reviewed and are negative.   Observations/Objective: Tele-visit patient not in distress  Assessment and Plan:  Take meds as prescribed - Use a cool mist humidifier  -Use saline nose sprays frequently -Force fluids -For fever or aches or pains- take Tylenol or ibuprofen. -Robafen for cough  -If symptoms do not improve, she may need to be COVID tested to rule this out Follow up with worsening unresolved symptoms   Follow Up Instructions: Follow up with worsening     I discussed the assessment and treatment plan with the patient. The patient was provided an opportunity to ask questions and all were answered. The patient agreed with the plan and demonstrated an understanding of the instructions.   The patient was advised to call back or seek an in-person evaluation if the symptoms worsen or if the condition fails to improve as anticipated.  The above assessment and management plan was discussed with the patient. The patient verbalized understanding of and has agreed to the management plan. Patient is aware to call the clinic if symptoms persist or worsen. Patient is aware when  to return to the clinic for a follow-up visit. Patient educated on when it is appropriate to go to the emergency department.   Time call ended:    I provided 10 minutes of  non face-to-face time during this encounter.    Ivy Lynn, NP

## 2021-03-24 NOTE — Progress Notes (Unsigned)
03/26/21- 73 yoM former smoker for sleep evaluation courtesy of Evelina Dun, FNP with concern of OSA Medical problem list includes CAD, Angina, OSA, Colon Polyps, Diverticulosis, GERD, Hypothyroid, DM2, Hyperlipidemia,  HST 10/04/17- AHI 50/ hr, desaturation to 66%, body weight 212 lbs CPAP auto 5-15/ [facility]/[employer] ordered 10/14/17 Epworth score- Download- Body weight today- Covid vax- Flu vax-

## 2021-03-25 ENCOUNTER — Telehealth: Payer: Self-pay | Admitting: Family

## 2021-03-25 MED ORDER — AZITHROMYCIN 250 MG PO TABS: ORAL_TABLET | ORAL | 0 refills | Status: DC

## 2021-03-25 NOTE — Telephone Encounter (Signed)
Pt aware.

## 2021-03-25 NOTE — Telephone Encounter (Signed)
Pt states he is not feeling better, has been in the bed all weekend, cough, sore throat and congestion. He says the Robafen isn't really helping and he feels like he needs a zpak. Pt wanted me to send this message to [place] and not [place].

## 2021-03-25 NOTE — Telephone Encounter (Signed)
Zpak Prescription sent to pharmacy   

## 2021-03-26 ENCOUNTER — Institutional Professional Consult (permissible substitution): Payer: Medicare HMO | Admitting: Internal Medicine

## 2021-04-01 ENCOUNTER — Other Ambulatory Visit: Payer: Self-pay

## 2021-04-01 ENCOUNTER — Encounter: Payer: Self-pay | Admitting: Primary Care

## 2021-04-01 ENCOUNTER — Ambulatory Visit (INDEPENDENT_AMBULATORY_CARE_PROVIDER_SITE_OTHER): Payer: Medicare HMO | Admitting: Primary Care

## 2021-04-01 DIAGNOSIS — G4733 Obstructive sleep apnea (adult) (pediatric): Secondary | ICD-10-CM | POA: Diagnosis not present

## 2021-04-01 NOTE — Progress Notes (Signed)
@Patient  ID: [name]., male    DOB: [birth year], 71 y.o.   MRN: [id number]  Chief Complaint  Patient presents with   Consult    Sleep consult    Referring provider: Sharion Balloon, FNP  HPI: 71 year old male, former smoker. PMH significant for hypothyroidism, hyperlipidemia, CAD and GERD. Patient of Dr. Ander Slade, initial consult for daytime fatigue, occasionally needing to take naps during the day. HST showed AHI 22. Recommended started home CPAP auto titrate 5-15cm H20.   Previous [facility] pulmonary encounter: 10/14/2017 Patient presents today to discuss HST results. His main concern is the potential cost of cpap machine. Explained to patient that there are a lot of variables that can effect cost and that I can not give him an accurate price off hand. Advised that he called his insurance company to discuss his plan coverage and current deductible.   04/01/2021- Interim hx  Patient presents today for overdue follow-up. [place] has hx severe OSA, not currently wearing CPAP but has working machine. Pressure set at 5-15cm 2h0. Needs new supplies. He snores loudly. He has a lot of daytime fatigue symptoms. He is scared to drive, feels he can not travel. Review risk of untreated sleep apnea including cardiac arrhthymias, stroke, pulm HTN and diabetes. We also reviewed treatment options including weight loss, oral appliance, CPAP therapy or referral to ENT for possible surgical options.   Sleep questionnaire: Symptoms- snoring, daytime sleepiness Prior sleep study-  10/04/2017>> Severe OSA, AHI 50/hr with SpO2 66% Bedtime- 10pm-12am Time to fall asleep- not very long Nocturnal awakenings- 4-6 times Out of bed- 7am Weight changes- up a few lbs Epworth- 20/24   Allergies  Allergen Reactions   Imdur [Isosorbide Nitrate]     Headache    Lipitor [Atorvastatin] Other (See Comments)    myalgias    Immunization History  Administered Date(s) Administered   Tdap 12/08/2007    Past  Medical History:  Diagnosis Date   Angina at rest [facility])    Benign neoplasm of colon    CAD (coronary artery disease)     Occluded RCA, 50% Diat 2006, 2008   Diverticulosis of colon (without mention of hemorrhage)    Esophageal reflux    Other and unspecified hyperlipidemia    Unspecified disorder of thyroid     Tobacco History: Social History   Tobacco Use  Smoking Status Former   Packs/day: 0.50   Types: Cigarettes   Quit date: 07/22/1986   Years since quitting: 34.7  Smokeless Tobacco Never   Counseling given: Not Answered   Outpatient Medications Prior to Visit  Medication Sig Dispense Refill   aspirin 81 MG EC tablet Take 81 mg by mouth daily.     azithromycin (ZITHROMAX) 250 MG tablet Take 500 mg once, then 250 mg for four days 6 tablet 0   Bempedoic Acid-Ezetimibe (NEXLIZET) 180-10 MG TABS Take 1 tablet by mouth daily. 30 tablet 4   fenofibrate (TRICOR) 145 MG tablet TAKE ONE (1) TABLET BY MOUTH EVERY DAY 90 tablet 1   fluticasone (FLONASE) 50 MCG/ACT nasal spray Place 2 sprays into both nostrils daily. 16 g 11   levothyroxine (SYNTHROID) 112 MCG tablet Take 1 tablet (112 mcg total) by mouth daily. 30 tablet 11   omeprazole (PRILOSEC) 20 MG capsule Take 2 caps po daily 180 capsule 3   ranolazine (RANEXA) 500 MG 12 hr tablet Take 1 tablet (500 mg total) by mouth 2 (two) times daily. 60 tablet 5  Vitamin D, Ergocalciferol, (DRISDOL) 1.25 MG (50000 UNIT) CAPS capsule Take 1 capsule (50,000 Units total) by mouth every 7 (seven) days. 12 capsule 0   guaiFENesin (ROBAFEN) 100 MG/5ML liquid Take 5 mLs by mouth every 4 (four) hours as needed for cough or to loosen phlegm. (Patient not taking: Reported on 04/01/2021) 120 mL 0   No facility-administered medications prior to visit.   Review of Systems  Review of Systems  Constitutional:  Positive for fatigue.  Respiratory: Negative.    Psychiatric/Behavioral:  Positive for sleep disturbance.     Physical Exam  BP 134/62  (BP Location: Left Arm, Cuff Size: Normal)    Pulse 71    Temp 98.2 F (36.8 C) (Oral)    Ht 6\' 3"  (1.905 m)    Wt 222 lb 9.6 oz (101 kg)    SpO2 97%    BMI 27.82 kg/m  Physical Exam Constitutional:      Appearance: Normal appearance.  HENT:     Head: Normocephalic and atraumatic.  Cardiovascular:     Rate and Rhythm: Normal rate and regular rhythm.  Skin:    General: Skin is warm and dry.  Neurological:     General: No focal deficit present.     Mental Status: He is alert and oriented to person, place, and time. Mental status is at baseline.  Psychiatric:        Mood and Affect: Mood normal.        Behavior: Behavior normal.        Thought Content: Thought content normal.        Judgment: Judgment normal.     Lab Results:  CBC    Component Value Date/Time   WBC 5.9 12/24/2020 1515   WBC 6.1 06/08/2014 1710   WBC 5.2 01/18/2007 1053   RBC 4.62 12/24/2020 1515   RBC 5.00 06/08/2014 1710   RBC 4.43 01/18/2007 1053   HGB 13.0 12/24/2020 1515   HCT 40.2 12/24/2020 1515   PLT 306 12/24/2020 1515   MCV 87 12/24/2020 1515   MCH 28.1 12/24/2020 1515   MCH 27.2 06/08/2014 1710   MCHC 32.3 12/24/2020 1515   MCHC 31.3 (A) 06/08/2014 1710   MCHC 34.6 01/18/2007 1053   RDW 13.3 12/24/2020 1515   LYMPHSABS 1.7 12/24/2020 1515   MONOABS 0.4 01/18/2007 1053   EOSABS 0.2 12/24/2020 1515   BASOSABS 0.1 12/24/2020 1515    BMET    Component Value Date/Time   NA 135 12/24/2020 1515   K 4.4 12/24/2020 1515   CL 96 12/24/2020 1515   CO2 26 12/24/2020 1515   GLUCOSE 191 (H) 12/24/2020 1515   GLUCOSE 101 (H) 10/15/2012 0854   BUN 12 12/24/2020 1515   CREATININE 1.24 12/24/2020 1515   CREATININE 1.40 (H) 10/15/2012 0854   CALCIUM 9.7 12/24/2020 1515   GFRNONAA 91 09/27/2019 1648   GFRNONAA 53 (L) 10/15/2012 0854   GFRAA 105 09/27/2019 1648   GFRAA 61 10/15/2012 0854    BNP No results found for: BNP  ProBNP No results found for: PROBNP  Imaging: No results  found.   Assessment & Plan:   Obstructive sleep apnea - HST 10/04/17 showed severe OSA, AHI 50/hr. He has symptoms of snoring and daytime sleepiness. Epworth 20. He has a working CPAP machine but is not currently using. CPAP pressure 5-15cm h20. Needs to re-establish with DME ([facility]) company for CPAP supplies. Encourage patient resume CPAP use. Aim to get 4-6 hours or sleep each night  or more. Advised against driving if experiencing excessive daytime sleepiness. If he can not tolerate CPAP may ben candidate for inspire device. Fu in 4-6 week with Beth NP for CPAP compliance.      Martyn Ehrich, NP 04/02/2021

## 2021-04-01 NOTE — Assessment & Plan Note (Addendum)
-   HST 10/04/17 showed severe OSA, AHI 50/hr. He has symptoms of snoring and daytime sleepiness. Epworth 20. He has a working CPAP machine but is not currently using. CPAP pressure 5-15cm h20. Needs to re-establish with DME ([employer]) company for CPAP supplies. Encourage patient resume CPAP use. Aim to get 4-6 hours or sleep each night or more. Advised against driving if experiencing excessive daytime sleepiness. If he can not tolerate CPAP may ben candidate for inspire device. Fu in 4-6 week with Beth NP for CPAP compliance.

## 2021-04-01 NOTE — Patient Instructions (Addendum)
° °  Orders: Needs to re-establish with DME ([facility]) company for CPAP supplies/ CPAP pressure 5-15cm h20  Follow-up: 4-6 week with Beth NP for CPAP compliance

## 2021-04-02 ENCOUNTER — Telehealth: Payer: Self-pay | Admitting: Primary Care

## 2021-04-02 NOTE — Progress Notes (Signed)
Reviewed and agree with assessment/plan.   Chesley Mires, MD [facility] Pulmonary/Critical Care 04/02/2021, 8:56 AM Pager:  [phone]

## 2021-04-02 NOTE — Telephone Encounter (Signed)
Can we call patient and tell him which DME company to bring CPAP by to have them make sure it is in working condition and fit him for mask/supplies. Ordered placed yesterday. He left without AVS

## 2021-04-02 NOTE — Telephone Encounter (Signed)
Mychart message sent to pt with the instructions from the AVS yesterday 2/20 and also stated to pt to call the office to schedule a follow up appt. Nothing further needed.

## 2021-04-05 NOTE — Telephone Encounter (Signed)
Left vm

## 2021-04-15 ENCOUNTER — Telehealth: Payer: Self-pay | Admitting: Primary Care

## 2021-04-15 NOTE — Telephone Encounter (Signed)
Called and left patient a voicemail to call office back  ?

## 2021-04-17 ENCOUNTER — Ambulatory Visit (INDEPENDENT_AMBULATORY_CARE_PROVIDER_SITE_OTHER): Payer: Medicare HMO | Admitting: Pharmacist

## 2021-04-17 DIAGNOSIS — I2510 Atherosclerotic heart disease of native coronary artery without angina pectoris: Secondary | ICD-10-CM

## 2021-04-17 DIAGNOSIS — E782 Mixed hyperlipidemia: Secondary | ICD-10-CM

## 2021-04-17 DIAGNOSIS — I2583 Coronary atherosclerosis due to lipid rich plaque: Secondary | ICD-10-CM

## 2021-04-19 NOTE — Patient Instructions (Signed)
Visit Information ? ?Following are the goals we discussed today:  ?Current Barriers:  ?Unable to independently afford treatment regimen ?Unable to achieve control of hyperlipidemia, hypertension  ?Suboptimal therapeutic regimen for hyperlipidemia, hypertension ? ?Pharmacist Clinical Goal(s):  ?patient will verbalize ability to afford treatment regimen ?achieve control of HLD, HTN as evidenced by GOALS MET-LDL<70, BP <130/80 ?adhere to plan to optimize therapeutic regimen for HLD, HTN as evidenced by report of adherence to recommended medication management changes through collaboration with PharmD and provider.  ? ?Interventions: ?1:1 collaboration with Sharion Balloon, FNP regarding development and update of comprehensive plan of care as evidenced by provider attestation and co-signature ?Inter-disciplinary care team collaboration (see longitudinal plan of care) ?Comprehensive medication review performed; medication list updated in electronic medical record ? ?Hypertension:  Goal on Track (progressing): YES. ?Uncontrolled; current treatment: not on medication, will discuss case with PCP, HLD was the primary goal of this visit;  ?Current home readings: 150s/80s ?Denies hypotensive/hypertensive symptoms ?Collaborated with pcp--will discuss changes at f/u; start ACEi ?Hyperlipidemia:  Goal on Track (progressing): YES. ?Uncontrolled; current treatment: Nexlizet, fenofibrate for triglycerides;  ?Tolerating nexlizet well ?Will apply for [employer] foundation grant for assistance-->patient approved, he was able to pick up from the pharmacy ? ?Medications previously tried: statins (atorvastatin)  ?Current dietary patterns: recommended heart health/Mediterranean  ?Recommended continue nexlizet; consider repatha if fails; declines statin w/ adjusted regimen; repeat lipid panel in 3-6 months ?Assessed patient finances. [place] grant--approved ?ICD 10: G72 documented for statin induced myopathy ? ?Lipid Panel  ?    ?Component Value Date/Time  ? CHOL 246 (H) 12/24/2020 1515  ? CHOL 202 (H) 10/15/2012 0854  ? TRIG 505 (H) 12/24/2020 1515  ? TRIG 590 (HH) 06/08/2014 1703  ? TRIG 241 (H) 10/15/2012 0854  ? HDL 29 (L) 12/24/2020 1515  ? HDL 43 06/08/2014 1703  ? HDL 39 (L) 10/15/2012 0854  ? CHOLHDL 8.5 (H) 12/24/2020 1515  ? CHOLHDL 5.9 CALC 01/18/2007 0846  ? VLDL 55 (H) 01/18/2007 0846  ? LDLCALC 126 (H) 12/24/2020 1515  ? [place] 115 (H) 10/15/2012 0854  ? LDLDIRECT 72 05/13/2016 1100  ? LDLDIRECT 124.9 01/18/2007 0846  ? LABVLDL 91 (H) 12/24/2020 1515  ? ?Patient Goals/Self-Care Activities ?patient will:  ?- take medications as prescribed as evidenced by patient report and record review ?collaborate with provider on medication access solutions ?target a minimum of 150 minutes of moderate intensity exercise weekly ?engage in dietary modifications by heart healthy diet/mediterranean diet ? ? ?Plan: Telephone follow up appointment with care management team member scheduled for:  3 months ? ?Signature ?Regina Eck, PharmD, BCPS ?Clinical Pharmacist, [facility] ?[place]  [place] Phone [phone] ? ? ?Please call the care guide team at [phone] if you need to cancel or reschedule your appointment.  ? ?The patient verbalized understanding of instructions, educational materials, and care plan provided today and declined offer to receive copy of patient instructions, educational materials, and care plan.  ? ?

## 2021-04-19 NOTE — Telephone Encounter (Signed)
I called to see where [employer] was at on this order for patient's cpap supplies and they stated he was non compliant so they were unable to ful fill the order for the patient my understanding means that they will have to get a new sleep study before they can fill the order for supplies ?

## 2021-04-19 NOTE — Telephone Encounter (Signed)
Spoke with the pt  ?He is aware of the situation  ?He says that he understands he was non compliant in the past, but has been using CPAP lately and it's helping  ?It sounds like ins will require an new sleep study to cover CPAP  ?Beth, do you want [place] to go ahead and order this  ?OK to wait until Beth back in office to be addressed ?

## 2021-04-19 NOTE — Progress Notes (Signed)
Chronic Care Management Pharmacy Note  04/17/2021 Name:  [name]. MRN:  [id number] DOB:  03/28/1950  Summary:HLD, HTN  Recommendations/Changes made from today's visit: Hyperlipidemia:  Goal on Track (progressing): YES. Uncontrolled; current treatment: Nexlizet, fenofibrate for triglycerides;  Tolerating nexlizet well Will apply for healthwell foundation grant for assistance-->patient approved, he was able to pick up from the pharmacy  Medications previously tried: statins (atorvastatin)  Current dietary patterns: recommended heart health/Mediterranean  Recommended continue nexlizet; consider repatha if fails; declines statin w/ adjusted regimen; repeat lipid panel in 3-6 months Assessed patient finances. 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 is an 71 y.o. year old male who is a primary patient of Sharion Balloon, FNP.  The CCM team was consulted for assistance with disease management and care coordination needs.    Engaged with patient by telephone for follow up visit in response to provider referral for pharmacy case management and/or care coordination services.   Consent to Services:  The patient was given information about Chronic Care Management services, agreed to services, and gave verbal consent prior to initiation of services.  Please see initial visit note for detailed documentation.   Patient Care Team: Sharion Balloon, FNP as PCP - General (Family Medicine) Hilarie Fredrickson, Lajuan Lines, MD as Consulting Physician (Gastroenterology) Laurin Coder, MD as Consulting Physician (Pulmonary Disease) Minus Breeding, MD as Consulting Physician (Cardiology) Lavera Guise, [facility] as Pharmacist (Family Medicine)  Objective:  Lab Results  Component Value Date   CREATININE 1.24 12/24/2020   CREATININE 0.82 06/14/2020   CREATININE 0.80 09/27/2019    Lab Results  Component Value Date    HGBA1C 7.2 (H) 12/24/2020   Last diabetic Eye exam: No results found for: HMDIABEYEEXA  Last diabetic Foot exam: No results found for: HMDIABFOOTEX      Component Value Date/Time   CHOL 246 (H) 12/24/2020 1515   CHOL 202 (H) 10/15/2012 0854   TRIG 505 (H) 12/24/2020 1515   TRIG 590 (HH) 06/08/2014 1703   TRIG 241 (H) 10/15/2012 0854   HDL 29 (L) 12/24/2020 1515   HDL 43 06/08/2014 1703   HDL 39 (L) 10/15/2012 0854   CHOLHDL 8.5 (H) 12/24/2020 1515   CHOLHDL 5.9 CALC 01/18/2007 0846   VLDL 55 (H) 01/18/2007 0846   LDLCALC 126 (H) 12/24/2020 1515   LDLCALC 115 (H) 10/15/2012 0854   LDLDIRECT 72 05/13/2016 1100   LDLDIRECT 124.9 01/18/2007 0846    Hepatic Function Latest Ref Rng & Units 12/24/2020 06/14/2020 09/27/2019  Total Protein 6.0 - 8.5 g/dL 7.3 7.5 7.7  Albumin 3.7 - 4.7 g/dL 4.6 4.9(H) 4.7  AST 0 - 40 IU/L 39 27 26  ALT 0 - 44 IU/L 45(H) 32 29  Alk Phosphatase 44 - 121 IU/L 67 92 88  Total Bilirubin 0.0 - 1.2 mg/dL 0.3 0.4 0.4  Bilirubin, Direct 0.00 - 0.40 mg/dL - - -    Lab Results  Component Value Date/Time   TSH 4.890 (H) 12/24/2020 03:15 PM   TSH 4.510 (H) 06/14/2020 01:09 PM    CBC Latest Ref Rng & Units 12/24/2020 06/14/2020 09/27/2019  WBC 3.4 - 10.8 x10E3/uL 5.9 5.7 6.4  Hemoglobin 13.0 - 17.7 g/dL 13.0 13.5 14.4  Hematocrit 37.5 - 51.0 % 40.2 40.6 43.0  Platelets 150 - 450 x10E3/uL 306 275 278    Lab Results  Component Value Date/Time   VD25OH 36.0 06/14/2020 01:09 PM  VD25OH 34.1 03/29/2019 03:35 PM    Clinical ASCVD: No  The 10-year ASCVD risk score (Arnett DK, et al., 2019) is: 54.4%   Values used to calculate the score:     Age: 71 years     Sex: Male     Is Non-Hispanic African American: No     Diabetic: Yes     Tobacco smoker: No     Systolic Blood Pressure: 009 mmHg     Is BP treated: Yes     HDL Cholesterol: 29 mg/dL     Total Cholesterol: 246 mg/dL    Other: (CHADS2VASc if Afib, PHQ9 if depression, MMRC or CAT for COPD, ACT,  DEXA)  Social History   Tobacco Use  Smoking Status Former   Packs/day: 0.50   Types: Cigarettes   Quit date: 07/22/1986   Years since quitting: 34.7  Smokeless Tobacco Never   BP Readings from Last 3 Encounters:  04/01/21 134/62  12/24/20 (!) 146/74  06/14/20 138/77   Pulse Readings from Last 3 Encounters:  04/01/21 71  12/24/20 60  06/14/20 66   Wt Readings from Last 3 Encounters:  04/01/21 222 lb 9.6 oz (101 kg)  12/24/20 222 lb 6.4 oz (100.9 kg)  10/12/20 218 lb (98.9 kg)    Assessment: Review of patient past medical history, allergies, medications, health status, including review of consultants reports, laboratory and other test data, was performed as part of comprehensive evaluation and provision of chronic care management services.   SDOH:  (Social Determinants of Health) assessments and interventions performed:    CCM Care Plan  Allergies  Allergen Reactions   Imdur [Isosorbide Nitrate]     Headache    Lipitor [Atorvastatin] Other (See Comments)    myalgias    Medications Reviewed Today     Reviewed by Lavera Guise, [facility] (Pharmacist) on 04/19/21 at 1512  Med List Status: <None>   Medication Order Taking? Sig Documenting Provider Last Dose Status Informant  aspirin 81 MG EC tablet [id number] No Take 81 mg by mouth daily. [provider] Taking Active   azithromycin (ZITHROMAX) 250 MG tablet [id number] No Take 500 mg once, then 250 mg for four days Sharion Balloon, FNP Taking Active   Bempedoic Acid-Ezetimibe (NEXLIZET) 180-10 MG TABS [id number] No Take 1 tablet by mouth daily. Sharion Balloon, FNP Taking Active   fenofibrate (TRICOR) 145 MG tablet [id number] No TAKE ONE (1) TABLET BY MOUTH EVERY DAY Hawks, Christy A, FNP Taking Active   fluticasone (FLONASE) 50 MCG/ACT nasal spray [id number] No Place 2 sprays into both nostrils daily. Sharion Balloon, FNP Taking Active   guaiFENesin (ROBAFEN) 100 MG/5ML liquid [id number] No Take 5 mLs by mouth every  4 (four) hours as needed for cough or to loosen phlegm.  Patient not taking: Reported on 04/01/2021   Ivy Lynn, NP Not Taking Active   levothyroxine (SYNTHROID) 112 MCG tablet [id number] No Take 1 tablet (112 mcg total) by mouth daily. Sharion Balloon, FNP Taking Active   omeprazole (PRILOSEC) 20 MG capsule [id number] No Take 2 caps po daily Hawks, [place] A, FNP Taking Active   ranolazine (RANEXA) 500 MG 12 hr tablet [id number] No Take 1 tablet (500 mg total) by mouth 2 (two) times daily. Sharion Balloon, FNP Taking Active   Vitamin D, Ergocalciferol, (DRISDOL) 1.25 MG (50000 UNIT) CAPS capsule [id number] No Take 1 capsule (50,000 Units total) by mouth every 7 (seven) days. Sharion Balloon, FNP Taking Active  Patient Active Problem List   Diagnosis Date Noted   Type 2 diabetes mellitus with hyperglycemia ([place]) 28/41/3244   Umbilical hernia without obstruction and without gangrene 06/14/2020   Function kidney decreased 12/22/2018   Vitamin D deficiency 09/15/2018   Trigger middle finger of right hand 09/15/2018   Obstructive sleep apnea 10/14/2017   Aortic atherosclerosis ([place]) 08/05/2016   Diverticulosis of colon (without mention of hemorrhage)    Mixed hyperlipidemia    Benign neoplasm of colon    COLONIC POLYPS 06/12/2007   Hypothyroidism 06/12/2007   Coronary artery disease 06/12/2007   Coronary artery disease involving native coronary artery of native heart with angina pectoris with documented spasm ([place]) 06/12/2007   GERD 06/12/2007   Diverticulosis of colon 06/12/2007   GUAIAC POSITIVE STOOL 06/12/2007    Immunization History  Administered Date(s) Administered   Tdap 12/08/2007    Conditions to be addressed/monitored: HTN and HLD  Care Plan : PHARMD MEDICATION MANAGEMENT  Updates made by Lavera Guise, RPH since 04/19/2021 12:00 AM     Problem: DISEASE PROGRESSION PREVENTION      Long-Range Goal: HLD, HTN PHAMRD GOAL   Recent Progress: Not  on track  Priority: High  Note:   Current Barriers:  Unable to independently afford treatment regimen Unable to achieve control of hyperlipidemia, hypertension  Suboptimal therapeutic regimen for hyperlipidemia, hypertension  Pharmacist Clinical Goal(s):  patient will verbalize ability to afford treatment regimen achieve control of HLD, HTN as evidenced by GOALS MET-LDL<70, BP <130/80 adhere to plan to optimize therapeutic regimen for HLD, HTN as evidenced by report of adherence to recommended medication management changes through collaboration with PharmD and provider.   Interventions: 1:1 collaboration with Sharion Balloon, FNP regarding development and update of comprehensive plan of care as evidenced by provider attestation and co-signature Inter-disciplinary care team collaboration (see longitudinal plan of care) Comprehensive medication review performed; medication list updated in electronic medical record  Hypertension:  Goal on Track (progressing): YES. Uncontrolled; current treatment: not on medication, will discuss case with PCP, HLD was the primary goal of this visit;  Current home readings: 150s/80s Denies hypotensive/hypertensive symptoms Collaborated with pcp--will discuss changes at f/u; start ACEi Hyperlipidemia:  Goal on Track (progressing): YES. Uncontrolled; current treatment: Nexlizet, fenofibrate for triglycerides;  Tolerating nexlizet well Will apply for healthwell foundation grant for assistance-->patient approved, he was able to pick up from the pharmacy  Medications previously tried: statins (atorvastatin)  Current dietary patterns: recommended heart health/Mediterranean  Recommended continue nexlizet; consider repatha if fails; declines statin w/ adjusted regimen; repeat lipid panel in 3-6 months Assessed patient finances. 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  Lipid Panel     Component Value Date/Time    CHOL 246 (H) 12/24/2020 1515   CHOL 202 (H) 10/15/2012 0854   TRIG 505 (H) 12/24/2020 1515   TRIG 590 (HH) 06/08/2014 1703   TRIG 241 (H) 10/15/2012 0854   HDL 29 (L) 12/24/2020 1515   HDL 43 06/08/2014 1703   HDL 39 (L) 10/15/2012 0854   CHOLHDL 8.5 (H) 12/24/2020 1515   CHOLHDL 5.9 CALC 01/18/2007 0846   VLDL 55 (H) 01/18/2007 0846   LDLCALC 126 (H) 12/24/2020 1515   LDLCALC 115 (H) 10/15/2012 0854   LDLDIRECT 72 05/13/2016 1100   LDLDIRECT 124.9 01/18/2007 0846   LABVLDL 91 (H) 12/24/2020 1515  Patient Goals/Self-Care Activities patient will:  - take medications as prescribed as evidenced by patient report and record review collaborate with  provider on medication access solutions target a minimum of 150 minutes of moderate intensity exercise weekly engage in dietary modifications by heart healthy diet/mediterranean diet      Medication Assistance:  healthwell grant  Patient's preferred pharmacy is:  [facility], [place] [address] [address] Phone: [phone] Fax: [phone]  Follow Up:  Patient agrees to Care Plan and Follow-up.  Plan: Telephone follow up appointment with care management team member scheduled for:  07/2021   Regina Eck, PharmD, BCPS Clinical Pharmacist, [place]  [facility] Phone [phone]

## 2021-05-01 NOTE — Telephone Encounter (Signed)
He needs supplies before he can start using again. His machine is in working condition. It was my understanding they did not need compliance report to renew supplies.

## 2021-05-02 NOTE — Telephone Encounter (Signed)
Corey Ehrich, NP  Rosana Berger, CMA ?Cc: P [facility] Triage Pool ?Caller: Unspecified (2 weeks ago) ?He needs supplies before he can start using again. His machine is in working condition. It was my understanding they did not need compliance report to renew supplies.   ?  ?   ? ?

## 2021-05-03 NOTE — Telephone Encounter (Signed)
We can order repeat HST or he can get supplies online and start wearing CPAP again and come back after 1-3 months to document compliance

## 2021-05-03 NOTE — Telephone Encounter (Signed)
[name]'s message to BW. Please advise. ?

## 2021-05-03 NOTE — Telephone Encounter (Signed)
ATC patient again and he did not answer. LVM for patient to call back.  ?

## 2021-05-03 NOTE — Telephone Encounter (Signed)
Hey spoke to [place] @ [facility] they state he has to be compliant to get supplies  ?

## 2021-05-03 NOTE — Telephone Encounter (Signed)
[facility] patient. LVM for him to call us back.  ?

## 2021-05-03 NOTE — Telephone Encounter (Signed)
Called patient but he did not answer. Left message for him to call back.  

## 2021-05-10 DIAGNOSIS — E782 Mixed hyperlipidemia: Secondary | ICD-10-CM | POA: Diagnosis not present

## 2021-05-10 DIAGNOSIS — I2583 Coronary atherosclerosis due to lipid rich plaque: Secondary | ICD-10-CM | POA: Diagnosis not present

## 2021-05-10 DIAGNOSIS — Z87891 Personal history of nicotine dependence: Secondary | ICD-10-CM

## 2021-05-10 DIAGNOSIS — I2510 Atherosclerotic heart disease of native coronary artery without angina pectoris: Secondary | ICD-10-CM | POA: Diagnosis not present

## 2021-05-11 ENCOUNTER — Other Ambulatory Visit: Payer: Self-pay | Admitting: Family

## 2021-05-11 DIAGNOSIS — E559 Vitamin D deficiency, unspecified: Secondary | ICD-10-CM

## 2021-05-21 NOTE — Telephone Encounter (Signed)
Called and spoke with pt to see if he was able to get supplies for his cpap machine and pt said he still had not. Pt also stated that he has been having trouble trying to wear the machine.  Stated to pt that we could schedule a visit to have other options discussed if he wants to try something else other than CPAP. Pt said he will give CPAP another try but if still not able to wear it, he would then call to get visit scheduled. Stated to pt if he is able to wear the CPAP that we will need to see him 31-90 days after he starts to use it again so we can check compliance. Pt verbalized understanding. Nothing further needed. ?

## 2021-07-04 ENCOUNTER — Other Ambulatory Visit: Payer: Self-pay | Admitting: Family

## 2021-07-04 DIAGNOSIS — I2510 Atherosclerotic heart disease of native coronary artery without angina pectoris: Secondary | ICD-10-CM

## 2021-07-11 ENCOUNTER — Ambulatory Visit (INDEPENDENT_AMBULATORY_CARE_PROVIDER_SITE_OTHER): Payer: Medicare HMO | Admitting: Nurse Practitioner

## 2021-07-11 ENCOUNTER — Other Ambulatory Visit: Payer: Self-pay | Admitting: Family

## 2021-07-11 ENCOUNTER — Encounter: Payer: Self-pay | Admitting: Nurse Practitioner

## 2021-07-11 ENCOUNTER — Telehealth: Payer: Self-pay | Admitting: Family

## 2021-07-11 DIAGNOSIS — J0101 Acute recurrent maxillary sinusitis: Secondary | ICD-10-CM

## 2021-07-11 MED ORDER — AZITHROMYCIN 250 MG PO TABS: ORAL_TABLET | ORAL | 0 refills | Status: DC

## 2021-07-11 MED ORDER — FLUTICASONE PROPIONATE 50 MCG/ACT NA SUSP: 2.0000 | Freq: Every day | NASAL | 6 refills | Status: DC

## 2021-07-11 MED ORDER — AMOXICILLIN-POT CLAVULANATE 875-125 MG PO TABS: 1.0000 | ORAL_TABLET | Freq: Two times a day (BID) | ORAL | 0 refills | Status: DC

## 2021-07-11 NOTE — Telephone Encounter (Signed)
Had visit with MMM- please advise

## 2021-07-11 NOTE — Patient Instructions (Signed)

## 2021-07-11 NOTE — Telephone Encounter (Signed)
Changed ampxicillin to z pak

## 2021-07-11 NOTE — Progress Notes (Signed)
Virtual Visit  Note Due to COVID-19 pandemic this visit was conducted virtually. This visit type was conducted due to national recommendations for restrictions regarding the COVID-19 Pandemic (e.g. social distancing, sheltering in place) in an effort to limit this patient's exposure and mitigate transmission in our community. All issues noted in this document were discussed and addressed.  A physical exam was not performed with this format.  I connected with Corey Ochs. on 07/11/21 at 1:28 by telephone and verified that I am speaking with the correct person using two identifiers. Corey Ochs. is currently located at home and no on is currently with him during visit. The provider, Mary-Margaret Hassell Done, FNP is located in their office at time of visit.  I discussed the limitations, risks, security and privacy concerns of performing an evaluation and management service by telephone and the availability of in person appointments. I also discussed with the patient that there may be a patient responsible charge related to this service. The patient expressed understanding and agreed to proceed.   History and Present Illness:  URI  This is a new problem. The current episode started in the past 7 days (tuesday morning). The problem has been waxing and waning. There has been no fever. Associated symptoms include congestion, coughing, headaches, rhinorrhea, sinus pain and a sore throat. He has tried acetaminophen and antihistamine for the symptoms. The treatment provided mild relief.     Review of Systems  HENT:  Positive for congestion, rhinorrhea, sinus pain and sore throat.   Respiratory:  Positive for cough.   Neurological:  Positive for headaches.    Observations/Objective: Alert and oriented- answers all questions appropriately No distress Raspy voice Deep cough Sinus pressure  Assessment and Plan: Corey Ochs. in today with chief complaint of URI   1. Acute  recurrent maxillary sinusitis 1. Take meds as prescribed 2. Use a cool mist humidifier especially during the winter months and when heat has been humid. 3. Use saline nose sprays frequently 4. Saline irrigations of the nose can be very helpful if done frequently.  * 4X daily for 1 week*  * Use of a nettie pot can be helpful with this. Follow directions with this* 5. Drink plenty of fluids 6. Keep thermostat turn down low 7.For any cough or congestion- mucinex 8. For fever or aces or pains- take tylenol or ibuprofen appropriate for age and weight.  * for fevers greater than 101 orally you may alternate ibuprofen and tylenol every  3 hours.    - fluticasone (FLONASE) 50 MCG/ACT nasal spray; Place 2 sprays into both nostrils daily.  Dispense: 16 g; Refill: 6 - amoxicillin-clavulanate (AUGMENTIN) 875-125 MG tablet; Take 1 tablet by mouth 2 (two) times daily.  Dispense: 14 tablet; Refill: 0   Follow Up Instructions: prn    I discussed the assessment and treatment plan with the patient. The patient was provided an opportunity to ask questions and all were answered. The patient agreed with the plan and demonstrated an understanding of the instructions.   The patient was advised to call back or seek an in-person evaluation if the symptoms worsen or if the condition fails to improve as anticipated.  The above assessment and management plan was discussed with the patient. The patient verbalized understanding of and has agreed to the management plan. Patient is aware to call the clinic if symptoms persist or worsen. Patient is aware when to return to the clinic for a follow-up visit. Patient  educated on when it is appropriate to go to the emergency department.   Time call ended:  140  I provided 12 minutes of  non face-to-face time during this encounter.    Mary-Margaret Hassell Done, FNP

## 2021-07-11 NOTE — Telephone Encounter (Signed)
Patient had appt and amoxicillin was called in, he does not want amoxicillin and would prefer a z pak. Please call patient to let him know if this can be done

## 2021-07-16 ENCOUNTER — Telehealth: Payer: Medicare HMO

## 2021-07-17 ENCOUNTER — Other Ambulatory Visit: Payer: Self-pay | Admitting: Nurse Practitioner

## 2021-07-17 NOTE — Telephone Encounter (Signed)
Tell him it stays in his system for 1 week after the prescription and if he continues to not feel better he may need to be seen and re-evaluated

## 2021-07-17 NOTE — Telephone Encounter (Signed)
Pt called to let us know that he is requesting refill on the Azithromycin because he ran out of Monday and is still having symptoms. Says hes feeling some better but not 100%.

## 2021-07-19 ENCOUNTER — Other Ambulatory Visit: Payer: Self-pay | Admitting: Family

## 2021-07-19 NOTE — Telephone Encounter (Signed)
Pt called back in, finished abx but still having really bad coughing spells but no phlegm. Needs to bet to feeling better, has his 73 yo mother he needs to take care of. Does he need another round of abx or he had Rx for cough back in Feb (Robafen) Please advise and send to [facility] before 5 pm

## 2021-08-08 ENCOUNTER — Other Ambulatory Visit: Payer: Self-pay | Admitting: Family

## 2021-08-08 DIAGNOSIS — I2510 Atherosclerotic heart disease of native coronary artery without angina pectoris: Secondary | ICD-10-CM

## 2021-08-16 ENCOUNTER — Other Ambulatory Visit: Payer: Self-pay | Admitting: Family

## 2021-08-16 DIAGNOSIS — E559 Vitamin D deficiency, unspecified: Secondary | ICD-10-CM

## 2021-08-16 DIAGNOSIS — E782 Mixed hyperlipidemia: Secondary | ICD-10-CM

## 2021-08-16 NOTE — Telephone Encounter (Signed)
LOV- 07/25/2021

## 2021-09-04 ENCOUNTER — Other Ambulatory Visit: Payer: Self-pay | Admitting: Family

## 2021-09-04 NOTE — Telephone Encounter (Signed)
[facility] NTBS 30 days given 07/22/21

## 2021-09-05 ENCOUNTER — Telehealth: Payer: Self-pay | Admitting: Family

## 2021-09-05 NOTE — Telephone Encounter (Signed)
Pt called stating that he went to the pharmacy and picked up his inhalers and said the pharmacy told him which medicine and dosage was costing him $32 but he cant remember what they told him.

## 2021-09-05 NOTE — Telephone Encounter (Signed)
DISREGARD PREVIOUS MESSAGE. WAS MEANT FOR SOMEONE ELSE.

## 2021-09-05 NOTE — Telephone Encounter (Signed)
If you could check and see what patient needs are re: medications  He is on nexlizet for cholesterol--if he is out or needs samples---there are plenty in the closet.  If there are other needs, we can set up appt time to discuss.  I will be out on PAL and return to office on 09/11/21  FYI: He sees PCP on 09/17/21

## 2021-09-05 NOTE — Telephone Encounter (Signed)
Appt made for 09/17/2021 with PCP

## 2021-09-05 NOTE — Telephone Encounter (Signed)
Pt will wait to speak with PCP when he comes in to see her on 09/17/21.

## 2021-09-05 NOTE — Telephone Encounter (Signed)
Left message for pt to return call.

## 2021-09-08 ENCOUNTER — Other Ambulatory Visit: Payer: Self-pay | Admitting: Family

## 2021-09-08 DIAGNOSIS — I2510 Atherosclerotic heart disease of native coronary artery without angina pectoris: Secondary | ICD-10-CM

## 2021-09-17 ENCOUNTER — Encounter: Payer: Self-pay | Admitting: Family

## 2021-09-17 ENCOUNTER — Ambulatory Visit (INDEPENDENT_AMBULATORY_CARE_PROVIDER_SITE_OTHER): Payer: Medicare HMO | Admitting: Family

## 2021-09-17 VITALS — BP 135/84 | HR 64 | Temp 98.3°F | Ht 75.0 in | Wt 213.8 lb

## 2021-09-17 DIAGNOSIS — I700 Atherosclerosis of aorta: Secondary | ICD-10-CM

## 2021-09-17 DIAGNOSIS — E1165 Type 2 diabetes mellitus with hyperglycemia: Secondary | ICD-10-CM | POA: Diagnosis not present

## 2021-09-17 DIAGNOSIS — T466X5A Adverse effect of antihyperlipidemic and antiarteriosclerotic drugs, initial encounter: Secondary | ICD-10-CM

## 2021-09-17 DIAGNOSIS — E039 Hypothyroidism, unspecified: Secondary | ICD-10-CM | POA: Diagnosis not present

## 2021-09-17 DIAGNOSIS — I2510 Atherosclerotic heart disease of native coronary artery without angina pectoris: Secondary | ICD-10-CM | POA: Diagnosis not present

## 2021-09-17 DIAGNOSIS — G4733 Obstructive sleep apnea (adult) (pediatric): Secondary | ICD-10-CM

## 2021-09-17 DIAGNOSIS — E782 Mixed hyperlipidemia: Secondary | ICD-10-CM

## 2021-09-17 DIAGNOSIS — K219 Gastro-esophageal reflux disease without esophagitis: Secondary | ICD-10-CM | POA: Diagnosis not present

## 2021-09-17 DIAGNOSIS — M791 Myalgia, unspecified site: Secondary | ICD-10-CM | POA: Diagnosis not present

## 2021-09-17 DIAGNOSIS — E559 Vitamin D deficiency, unspecified: Secondary | ICD-10-CM | POA: Diagnosis not present

## 2021-09-17 DIAGNOSIS — I2583 Coronary atherosclerosis due to lipid rich plaque: Secondary | ICD-10-CM

## 2021-09-17 LAB — BAYER DCA HB A1C WAIVED: HB A1C (BAYER DCA - WAIVED): 5.6 % (ref 4.8–5.6)

## 2021-09-17 MED ORDER — ROSUVASTATIN CALCIUM 5 MG PO TABS: 5.0000 mg | ORAL_TABLET | Freq: Every day | ORAL | 3 refills | Status: DC

## 2021-09-17 NOTE — Patient Instructions (Signed)

## 2021-09-17 NOTE — Progress Notes (Signed)
Subjective:    Patient ID: [name], male    DOB: 02/09/1951, 71 y.o.   MRN: [id number]  Chief Complaint  Patient presents with   Medical Management of Chronic Issues   Pt presents to the office today for chronic follow up.  He is followed by Cardiologists annually for CAD.    He has OSA and uses CPAP at times.   He has aortic atherosclerosis and takes  Gastroesophageal Reflux He complains of belching and heartburn. He reports no hoarse voice. This is a chronic problem. The current episode started more than 1 year ago. The problem occurs occasionally. The problem has been waxing and waning. Associated symptoms include fatigue. He has tried a PPI for the symptoms. The treatment provided moderate relief.  Diabetes He presents for his follow-up diabetic visit. He has type 2 diabetes mellitus. Associated symptoms include fatigue. Pertinent negatives for diabetes include no blurred vision and no foot paresthesias. Symptoms are stable. Risk factors for coronary artery disease include dyslipidemia, diabetes mellitus, hypertension and sedentary lifestyle. He is following a generally unhealthy diet. (Does not check glucose at home)  Thyroid Problem Presents for follow-up visit. Symptoms include fatigue. Patient reports no cold intolerance, diaphoresis or hoarse voice. The symptoms have been stable. His past medical history is significant for hyperlipidemia.  Hyperlipidemia This is a chronic problem. The current episode started more than 1 year ago. Current antihyperlipidemic treatment includes diet change. The current treatment provides no improvement of lipids. Risk factors for coronary artery disease include diabetes mellitus, dyslipidemia, a sedentary lifestyle and male sex.      Review of Systems  Constitutional:  Positive for fatigue. Negative for diaphoresis.  HENT:  Negative for hoarse voice.   Eyes:  Negative for blurred vision.  Gastrointestinal:  Positive for heartburn.   Endocrine: Negative for cold intolerance.  All other systems reviewed and are negative.      Objective:   Physical Exam Vitals reviewed.  Constitutional:      General: He is not in acute distress.    Appearance: He is well-developed.  HENT:     Head: Normocephalic.     Right Ear: Tympanic membrane normal.     Left Ear: Tympanic membrane normal.  Eyes:     General:        Right eye: No discharge.        Left eye: No discharge.     Pupils: Pupils are equal, round, and reactive to light.  Neck:     Thyroid: No thyromegaly.  Cardiovascular:     Rate and Rhythm: Normal rate and regular rhythm.     Heart sounds: Normal heart sounds. No murmur heard. Pulmonary:     Effort: Pulmonary effort is normal. No respiratory distress.     Breath sounds: Normal breath sounds. No wheezing.  Abdominal:     General: Bowel sounds are normal. There is no distension.     Palpations: Abdomen is soft.     Tenderness: There is no abdominal tenderness.  Musculoskeletal:        General: No tenderness. Normal range of motion.     Cervical back: Normal range of motion and neck supple.  Skin:    General: Skin is warm and dry.     Findings: No erythema or rash.  Neurological:     Mental Status: He is alert and oriented to person, place, and time.     Cranial Nerves: No cranial nerve deficit.     Deep  Tendon Reflexes: Reflexes are normal and symmetric.  Psychiatric:        Behavior: Behavior normal.        Thought Content: Thought content normal.        Judgment: Judgment normal.      Diabetic Foot Exam - Simple   Simple Foot Form Diabetic Foot exam was performed with the following findings: Yes 09/17/2021 11:54 AM  Visual Inspection No deformities, no ulcerations, no other skin breakdown bilaterally: Yes Sensation Testing Intact to touch and monofilament testing bilaterally: Yes Pulse Check Posterior Tibialis and Dorsalis pulse intact bilaterally: Yes Comments        BP 135/84   Pulse  64   Temp 98.3 F (36.8 C) (Temporal)   Ht 6' 3"  (1.905 m)   Wt 213 lb 12.8 oz (97 kg)   BMI 26.72 kg/m   Assessment & Plan:  [name] comes in today with chief complaint of Medical Management of Chronic Issues   Diagnosis and orders addressed:  1. Aortic atherosclerosis (HCC) - CMP14+EGFR - CBC with Differential/Platelet  2. Coronary artery disease due to lipid rich plaque Will start Crestor 5 mg, if having myalgia can decrease to every other day.  - rosuvastatin (CRESTOR) 5 MG tablet; Take 1 tablet (5 mg total) by mouth daily.  Dispense: 90 tablet; Refill: 3 - CMP14+EGFR - CBC with Differential/Platelet  3. Obstructive sleep apnea - CMP14+EGFR - CBC with Differential/Platelet  4. Gastroesophageal reflux disease without esophagitis - CMP14+EGFR - CBC with Differential/Platelet  5. Acquired hypothyroidism - CMP14+EGFR - CBC with Differential/Platelet - TSH  6. Type 2 diabetes mellitus with hyperglycemia, without long-term current use of insulin (HCC) - Microalbumin / creatinine urine ratio - rosuvastatin (CRESTOR) 5 MG tablet; Take 1 tablet (5 mg total) by mouth daily.  Dispense: 90 tablet; Refill: 3 - CMP14+EGFR - CBC with Differential/Platelet - Bayer DCA Hb A1c Waived  7. Mixed hyperlipidemia - rosuvastatin (CRESTOR) 5 MG tablet; Take 1 tablet (5 mg total) by mouth daily.  Dispense: 90 tablet; Refill: 3 - CMP14+EGFR - CBC with Differential/Platelet - Lipid panel  8. Vitamin D deficiency - CMP14+EGFR - CBC with Differential/Platelet  9. Myalgia due to statin   Labs pending Health Maintenance reviewed Diet and exercise encouraged  Follow up plan: 4 months    Evelina Dun, FNP

## 2021-09-18 LAB — CMP14+EGFR
ALT: 37 IU/L (ref 0–44)
AST: 36 IU/L (ref 0–40)
Albumin/Globulin Ratio: 1.6 (ref 1.2–2.2)
Albumin: 4.6 g/dL (ref 3.8–4.8)
Alkaline Phosphatase: 78 IU/L (ref 44–121)
BUN/Creatinine Ratio: 13 (ref 10–24)
BUN: 16 mg/dL (ref 8–27)
Bilirubin Total: 0.4 mg/dL (ref 0.0–1.2)
CO2: 24 mmol/L (ref 20–29)
Calcium: 10.1 mg/dL (ref 8.6–10.2)
Chloride: 96 mmol/L (ref 96–106)
Creatinine, Ser: 1.22 mg/dL (ref 0.76–1.27)
Globulin, Total: 2.9 g/dL (ref 1.5–4.5)
Glucose: 122 mg/dL — ABNORMAL HIGH (ref 70–99)
Potassium: 4.6 mmol/L (ref 3.5–5.2)
Sodium: 137 mmol/L (ref 134–144)
Total Protein: 7.5 g/dL (ref 6.0–8.5)
eGFR: 63 mL/min/{1.73_m2} (ref >59)

## 2021-09-18 LAB — LIPID PANEL
Chol/HDL Ratio: 19.7 ratio — ABNORMAL HIGH (ref 0.0–5.0)
Cholesterol, Total: 295 mg/dL — ABNORMAL HIGH (ref 100–199)
HDL: 15 mg/dL — ABNORMAL LOW (ref >39)
Triglycerides: 1371 mg/dL (ref 0–149)

## 2021-09-18 LAB — CBC WITH DIFFERENTIAL/PLATELET
Basophils Absolute: 0.1 10*3/uL (ref 0.0–0.2)
Basos: 1 %
EOS (ABSOLUTE): 0.3 10*3/uL (ref 0.0–0.4)
Eos: 5 %
Hematocrit: 37.8 % (ref 37.5–51.0)
Hemoglobin: 12.8 g/dL — ABNORMAL LOW (ref 13.0–17.7)
Immature Grans (Abs): 0.1 10*3/uL (ref 0.0–0.1)
Immature Granulocytes: 2 %
Lymphocytes Absolute: 1.9 10*3/uL (ref 0.7–3.1)
Lymphs: 34 %
MCH: 29.6 pg (ref 26.6–33.0)
MCHC: 33.9 g/dL (ref 31.5–35.7)
MCV: 88 fL (ref 79–97)
Monocytes Absolute: 0.5 10*3/uL (ref 0.1–0.9)
Monocytes: 8 %
Neutrophils Absolute: 2.9 10*3/uL (ref 1.4–7.0)
Neutrophils: 50 %
Platelets: 323 10*3/uL (ref 150–450)
RBC: 4.32 x10E6/uL (ref 4.14–5.80)
RDW: 14.4 % (ref 11.6–15.4)
WBC: 5.8 10*3/uL (ref 3.4–10.8)

## 2021-09-18 LAB — TSH: TSH: 2.05 u[IU]/mL (ref 0.450–4.500)

## 2021-09-18 LAB — MICROALBUMIN / CREATININE URINE RATIO
Creatinine, Urine: 80.7 mg/dL
Microalb/Creat Ratio: 21 mg/g creat (ref 0–29)
Microalbumin, Urine: 16.9 ug/mL

## 2021-09-19 ENCOUNTER — Other Ambulatory Visit: Payer: Self-pay | Admitting: Family

## 2021-09-19 DIAGNOSIS — M9901 Segmental and somatic dysfunction of cervical region: Secondary | ICD-10-CM | POA: Diagnosis not present

## 2021-09-19 DIAGNOSIS — M4004 Postural kyphosis, thoracic region: Secondary | ICD-10-CM | POA: Diagnosis not present

## 2021-09-19 DIAGNOSIS — M9902 Segmental and somatic dysfunction of thoracic region: Secondary | ICD-10-CM | POA: Diagnosis not present

## 2021-09-19 DIAGNOSIS — E781 Pure hyperglyceridemia: Secondary | ICD-10-CM

## 2021-09-19 DIAGNOSIS — M9903 Segmental and somatic dysfunction of lumbar region: Secondary | ICD-10-CM | POA: Diagnosis not present

## 2021-09-19 DIAGNOSIS — S338XXA Sprain of other parts of lumbar spine and pelvis, initial encounter: Secondary | ICD-10-CM | POA: Diagnosis not present

## 2021-09-19 DIAGNOSIS — M47812 Spondylosis without myelopathy or radiculopathy, cervical region: Secondary | ICD-10-CM | POA: Diagnosis not present

## 2021-09-19 NOTE — Progress Notes (Signed)
Lmtcb.

## 2021-09-25 DIAGNOSIS — S338XXA Sprain of other parts of lumbar spine and pelvis, initial encounter: Secondary | ICD-10-CM | POA: Diagnosis not present

## 2021-09-25 DIAGNOSIS — M9902 Segmental and somatic dysfunction of thoracic region: Secondary | ICD-10-CM | POA: Diagnosis not present

## 2021-09-25 DIAGNOSIS — M9903 Segmental and somatic dysfunction of lumbar region: Secondary | ICD-10-CM | POA: Diagnosis not present

## 2021-09-25 DIAGNOSIS — M9901 Segmental and somatic dysfunction of cervical region: Secondary | ICD-10-CM | POA: Diagnosis not present

## 2021-09-25 DIAGNOSIS — M47812 Spondylosis without myelopathy or radiculopathy, cervical region: Secondary | ICD-10-CM | POA: Diagnosis not present

## 2021-09-25 DIAGNOSIS — M4004 Postural kyphosis, thoracic region: Secondary | ICD-10-CM | POA: Diagnosis not present

## 2021-09-30 DIAGNOSIS — M4004 Postural kyphosis, thoracic region: Secondary | ICD-10-CM | POA: Diagnosis not present

## 2021-09-30 DIAGNOSIS — M9903 Segmental and somatic dysfunction of lumbar region: Secondary | ICD-10-CM | POA: Diagnosis not present

## 2021-09-30 DIAGNOSIS — M9902 Segmental and somatic dysfunction of thoracic region: Secondary | ICD-10-CM | POA: Diagnosis not present

## 2021-09-30 DIAGNOSIS — M9901 Segmental and somatic dysfunction of cervical region: Secondary | ICD-10-CM | POA: Diagnosis not present

## 2021-09-30 DIAGNOSIS — S338XXA Sprain of other parts of lumbar spine and pelvis, initial encounter: Secondary | ICD-10-CM | POA: Diagnosis not present

## 2021-09-30 DIAGNOSIS — M47812 Spondylosis without myelopathy or radiculopathy, cervical region: Secondary | ICD-10-CM | POA: Diagnosis not present

## 2021-10-03 ENCOUNTER — Telehealth: Payer: Self-pay | Admitting: Family

## 2021-10-03 NOTE — Telephone Encounter (Signed)
Patient would like to speak to [facility] regarding his medications.

## 2021-10-03 NOTE — Telephone Encounter (Signed)
Called and spoke with patient he states it is about his crestor states he is having muscle break down from it and having no energy he would like to ask julie and see what she thinks if it could be related he has already talked to pcp about it before.

## 2021-10-04 NOTE — Telephone Encounter (Signed)
Please let patient know:  His symptoms do sound like myopathy from statin (rosuvastatin). He can try taking it 2 times weekly to see if that makes a difference (example: take on Monday and Wednesdays ONLY or Mondays and Thursdays ONLY)  Also, did his grant run out? He was on Nexlizet for cholesterol.  I'm guessing it's too expensive now?  He can make a telephone of office visit if he has addition questions or concerns

## 2021-10-04 NOTE — Telephone Encounter (Signed)
Pt rc. He says he had a missed call from our number.

## 2021-10-07 DIAGNOSIS — M9901 Segmental and somatic dysfunction of cervical region: Secondary | ICD-10-CM | POA: Diagnosis not present

## 2021-10-07 DIAGNOSIS — M9902 Segmental and somatic dysfunction of thoracic region: Secondary | ICD-10-CM | POA: Diagnosis not present

## 2021-10-07 DIAGNOSIS — M47812 Spondylosis without myelopathy or radiculopathy, cervical region: Secondary | ICD-10-CM | POA: Diagnosis not present

## 2021-10-07 DIAGNOSIS — S338XXA Sprain of other parts of lumbar spine and pelvis, initial encounter: Secondary | ICD-10-CM | POA: Diagnosis not present

## 2021-10-07 DIAGNOSIS — M9903 Segmental and somatic dysfunction of lumbar region: Secondary | ICD-10-CM | POA: Diagnosis not present

## 2021-10-07 DIAGNOSIS — M4004 Postural kyphosis, thoracic region: Secondary | ICD-10-CM | POA: Diagnosis not present

## 2021-10-07 NOTE — Telephone Encounter (Signed)
Patient aware and verbalized understanding. °

## 2021-10-08 ENCOUNTER — Other Ambulatory Visit: Payer: Self-pay | Admitting: Family

## 2021-10-08 DIAGNOSIS — I2510 Atherosclerotic heart disease of native coronary artery without angina pectoris: Secondary | ICD-10-CM

## 2021-10-15 DIAGNOSIS — H05011 Cellulitis of right orbit: Secondary | ICD-10-CM | POA: Diagnosis not present

## 2021-10-16 ENCOUNTER — Ambulatory Visit (INDEPENDENT_AMBULATORY_CARE_PROVIDER_SITE_OTHER): Payer: Medicare HMO

## 2021-10-16 DIAGNOSIS — Z0000 Encounter for general adult medical examination without abnormal findings: Secondary | ICD-10-CM | POA: Diagnosis not present

## 2021-10-16 NOTE — Progress Notes (Signed)
MEDICARE ANNUAL WELLNESS VISIT  10/16/2021  Telephone Visit Disclaimer This Medicare AWV was conducted by telephone due to national recommendations for restrictions regarding the COVID-19 Pandemic (e.g. social distancing).  I verified, using two identifiers, that I am speaking with [name]. or their authorized healthcare agent. I discussed the limitations, risks, security, and privacy concerns of performing an evaluation and management service by telephone and the potential availability of an in-person appointment in the future. The patient expressed understanding and agreed to proceed.  Location of Patient: Home Location of Provider (nurse):  [facility]  Subjective:    [name]. is a 71 y.o. male patient of Hawks, Theador Hawthorne, FNP who had a Medicare Annual Wellness Visit today via telephone. [name] is semi-retired and lives alone.  He has three children. He reports that he is socially active and does interact with friends/family regularly. 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  Patient Care Team: Sharion Balloon, FNP as PCP - General (Family Medicine) Pyrtle, Lajuan Lines, MD as Consulting Physician (Gastroenterology) Laurin Coder, MD as Consulting Physician (Pulmonary Disease) Minus Breeding, MD as Consulting Physician (Cardiology) Lavera Guise, [facility] as Pharmacist (Family Medicine)     10/16/2021    2:48 PM 10/12/2020    3:49 PM 10/07/2019   10:47 AM 10/04/2018    2:38 PM 09/28/2017   11:29 AM  Advanced Directives  Does Patient Have a Medical Advance Directive? Yes No;Yes Yes No No  Type of Paramedic of [place];Living will;Out of facility DNR (pink MOST or yellow form)    Does patient want to make changes to medical advance directive? No - Patient declined      Copy of  [place] in Chart? No - copy requested  No - copy requested    Would patient like information on creating a medical advance directive?    No - Patient declined Yes (MAU/Ambulatory/Procedural Areas - Information given)    Hospital Utilization Over the Past 12 Months: # of hospitalizations or ER visits: 0 # of surgeries: 0  Review of Systems    Patient reports that his overall health is unchanged compared to last year.  History obtained from chart review and the patient  Patient Reported Readings (BP, Pulse, CBG, Weight, etc) none  Pain Assessment Pain : No/denies pain     Current Medications & Allergies (verified) Allergies as of 10/16/2021       Reactions   Imdur [isosorbide Nitrate]    Headache   Lipitor [atorvastatin] Other (See Comments)   myalgias        Medication List        Accurate as of October 16, 2021  2:55 PM. If you have any questions, ask your nurse or doctor.          STOP taking these medications    guaiFENesin 100 MG/5ML liquid Commonly known as: Robafen       TAKE these medications    aspirin EC 81 MG tablet Take 81 mg by mouth daily.   fenofibrate 145 MG tablet Commonly known as: TRICOR Take 1 tablet (145 mg total) by mouth daily. (NEEDS TO BE SEEN BEFORE NEXT REFILL)   fluticasone 50 MCG/ACT nasal spray Commonly known as: FLONASE Place 2 sprays into both  nostrils daily.   levothyroxine 112 MCG tablet Commonly known as: Synthroid Take 1 tablet (112 mcg total) by mouth daily.   Nexlizet 180-10 MG Tabs Generic drug: Bempedoic Acid-Ezetimibe TAKE ONE (1) TABLET BY MOUTH EVERY DAY   omeprazole 20 MG capsule Commonly known as: PRILOSEC Take 2 caps po daily   ranolazine 500 MG 12 hr tablet Commonly known as: RANEXA TAKE ONE (1) TABLET BY MOUTH TWO (2) TIMES DAILY   rosuvastatin 5 MG tablet Commonly known as: Crestor Take 1 tablet (5 mg total) by mouth daily.   Vitamin D (Ergocalciferol) 1.25 MG (50000  UNIT) Caps capsule Commonly known as: DRISDOL TAKE 1 CAPSULE BY MOUTH EVERY 7 DAYS        History (reviewed): Past Medical History:  Diagnosis Date   Angina at rest [facility])    Benign neoplasm of colon    CAD (coronary artery disease)     Occluded RCA, 50% Diat 2006, 2008   Diverticulosis of colon (without mention of hemorrhage)    Esophageal reflux    Other and unspecified hyperlipidemia    Unspecified disorder of thyroid    Past Surgical History:  Procedure Laterality Date   TONSILLECTOMY  1955   Family History  Problem Relation Age of Onset   Hyperlipidemia Father    Heart disease Father        pacemaker / CHF    Dementia Maternal Grandmother    Cancer Maternal Grandfather        throat   Alzheimer's disease Paternal Grandmother    Cancer Paternal Grandfather        lung   Cancer Sister        breast   Colon cancer Neg Hx    Social History   Socioeconomic History   Marital status: Widowed    Spouse name: Not on file   Number of children: 3   Years of education: 16   Highest education level: Bachelor's degree (e.g., BA, AB, BS)  Occupational History   Occupation: retired     Comment: golf / club prefessional   Tobacco Use   Smoking status: Former    Packs/day: 0.50    Types: Cigarettes    Quit date: 07/22/1986    Years since quitting: 35.2   Smokeless tobacco: Never  Vaping Use   Vaping Use: Never used  Substance and Sexual Activity   Alcohol use: Not Currently   Drug use: No   Sexual activity: Not Currently  Other Topics Concern   Not on file  Social History Narrative   Retired, widowed, lives alone. 3 grown children. Enjoys golf.    Social Determinants of Health   Financial Resource Strain: Low Risk  (10/12/2020)   Overall Financial Resource Strain (CARDIA)    Difficulty of Paying Living Expenses: Not hard at all  Food Insecurity: No Food Insecurity (10/12/2020)   Hunger Vital Sign    Worried About Running Out of Food in the Last Year: Never true     Ran Out of Food in the Last Year: Never true  Transportation Needs: No Transportation Needs (10/12/2020)   PRAPARE - Hydrologist (Medical): No    Lack of Transportation (Non-Medical): No  Physical Activity: Sufficiently Active (10/12/2020)   Exercise Vital Sign    Days of Exercise per Week: 5 days    Minutes of Exercise per Session: 30 min  Stress: No Stress Concern Present (10/12/2020)   [place]  Questionnaire    Feeling of Stress : Not at all  Social Connections: Moderately Integrated (10/12/2020)   Social Connection and Isolation Panel [NHANES]    Frequency of Communication with Friends and Family: More than three times a week    Frequency of Social Gatherings with Friends and Family: More than three times a week    Attends Religious Services: More than 4 times per year    Active Member of [employer] or Organizations: Yes    Attends Archivist Meetings: More than 4 times per year    Marital Status: Widowed    Activities of Daily Living    10/16/2021    2:49 PM  In your present state of health, do you have any difficulty performing the following activities:  Hearing? 0  Vision? 0  Difficulty concentrating or making decisions? 0  Walking or climbing stairs? 0  Dressing or bathing? 0  Doing errands, shopping? 0  Preparing Food and eating ? N  Using the Toilet? N  In the past six months, have you accidently leaked urine? N  Do you have problems with loss of bowel control? N  Managing your Medications? N  Managing your Finances? N  Housekeeping or managing your Housekeeping? N    Patient Education/ Literacy How often do you need to have someone help you when you read instructions, pamphlets, or other written materials from your doctor or pharmacy?: 1 - Never  Exercise Current Exercise Habits: Home exercise routine, Type of exercise: Other - see comments (plays golf), Time (Minutes): > 60,  Frequency (Times/Week): 4, Weekly Exercise (Minutes/Week): 0, Intensity: Moderate, Exercise limited by: None identified  Diet Patient reports consuming 3 meals a day and 1 snack(s) a day Patient reports that his primary diet is: Regular Patient reports that he does have regular access to food.   Depression Screen    09/17/2021   11:23 AM 12/24/2020    2:36 PM 10/12/2020    3:39 PM 06/14/2020   12:28 PM 10/07/2019   10:48 AM 09/27/2019    4:01 PM 03/29/2019    2:58 PM  PHQ 2/9 Scores  PHQ - 2 Score 0 0 0 0 0 0 0  PHQ- 9 Score 0           Fall Risk    09/17/2021   11:23 AM 12/24/2020    2:36 PM 10/12/2020    3:49 PM 06/14/2020   12:28 PM 10/07/2019   10:48 AM  Fall Risk   Falls in the past year? 0 0 0 0 0  Number falls in past yr:   0    Injury with Fall?   0    Risk for fall due to :   No Fall Risks    Follow up   Falls prevention discussed       Objective:  [name]. seemed alert and oriented and he participated appropriately during our telephone visit.  Blood Pressure Weight BMI  BP Readings from Last 3 Encounters:  09/17/21 135/84  04/01/21 134/62  12/24/20 (!) 146/74   Wt Readings from Last 3 Encounters:  09/17/21 213 lb 12.8 oz (97 kg)  04/01/21 222 lb 9.6 oz (101 kg)  12/24/20 222 lb 6.4 oz (100.9 kg)   BMI Readings from Last 1 Encounters:  09/17/21 26.72 kg/m    *Unable to obtain current vital signs, weight, and BMI due to telephone visit type  Hearing/Vision  Wojciech did not seem to have difficulty with hearing/understanding during  the telephone conversation Reports that he has had a formal eye exam by an eye care professional within the past year Reports that he has not had a formal hearing evaluation within the past year *Unable to fully assess hearing and vision during telephone visit type  Cognitive Function:    10/16/2021    2:51 PM 10/07/2019   10:50 AM 10/04/2018    2:40 PM  6CIT Screen  What Year? 0 points 0 points 0 points  What month? 0  points 0 points 0 points  What time? 0 points 0 points 0 points  Count back from 20 0 points 0 points 0 points  Months in reverse 0 points 0 points 0 points  Repeat phrase 0 points 0 points 0 points  Total Score 0 points 0 points 0 points   (Normal:0-7, Significant for Dysfunction: >8)  Normal Cognitive Function Screening: Yes   Immunization & Health Maintenance Record Immunization History  Administered Date(s) Administered   Tdap 12/08/2007    Health Maintenance  Topic Date Due   COVID-19 Vaccine (1) Never done   OPHTHALMOLOGY EXAM  Never done   Zoster Vaccines- Shingrix (1 of 2) 12/18/2021 (Originally 01/11/1970)   Pneumonia Vaccine 30+ Years old (1 - PCV) 12/24/2021 (Originally 01/10/2014)   COLONOSCOPY (Pts 45-34yr Insurance coverage will need to be confirmed)  12/24/2021 (Originally 02/15/2017)   TETANUS/TDAP  12/24/2021 (Originally 12/07/2017)   INFLUENZA VACCINE  05/11/2022 (Originally 09/10/2021)   HEMOGLOBIN A1C  03/20/2022   FOOT EXAM  09/18/2022   URINE MICROALBUMIN  09/18/2022   Hepatitis C Screening  Completed   HPV VACCINES  Aged Out   COLON CANCER SCREENING ANNUAL FOBT  Discontinued       Assessment  This is a routine wellness examination for [employer].Marland Kitchen Health Maintenance: Due or Overdue Health Maintenance Due  Topic Date Due   COVID-19 Vaccine (1) Never done   OPHTHALMOLOGY EXAM  Never done    CGeralynn Phillips does not need a referral for Community Assistance: Care Management:   no Social Work:    no Prescription Assistance:  no Nutrition/Diabetes Education:  no   Plan:  Personalized Goals  Goals Addressed             This Visit's Progress    Patient Stated       10/16/2021 AWV Goal: Fall Prevention  Over the next year, patient will decrease their risk for falls by: Using assistive devices, such as a cane or walker, as needed Identifying fall risks within their home and correcting them by: Removing throw rugs Adding  handrails to stairs or ramps Removing clutter and keeping a clear pathway throughout the home Increasing light, especially at night Adding shower handles/bars Raising toilet seat Identifying potential personal risk factors for falls: Medication side effects Incontinence/urgency Vestibular dysfunction Hearing loss Musculoskeletal disorders Neurological disorders Orthostatic hypotension         Personalized Health Maintenance & Screening Recommendations  Pneumococcal vaccine  Influenza vaccine Td vaccine Colorectal cancer screening Shingrix vaccine  Lung Cancer Screening Recommended: no (Low Dose CT Chest recommended if Age 433-80years, 30 pack-year currently smoking OR have quit w/in past 15 years) Hepatitis C Screening recommended: no HIV Screening recommended: no  Advanced Directives: Written information was not prepared per patient's request.  Referrals & Orders No orders of the defined types were placed in this encounter.   Follow-up Plan Follow-up with HSharion Balloon FNP as planned Schedule colonoscopy Tdap and pneumonia vaccine  at appointment in December with PCP    I have personally reviewed and noted the following in the patient's chart:   Medical and social history Use of alcohol, tobacco or illicit drugs  Current medications and supplements Functional ability and status Nutritional status Physical activity Advanced directives List of other physicians Hospitalizations, surgeries, and ER visits in previous 12 months Vitals Screenings to include cognitive, depression, and falls Referrals and appointments  In addition, I have reviewed and discussed with [name]. certain preventive protocols, quality metrics, and best practice recommendations. A written personalized care plan for preventive services as well as general preventive health recommendations is available and can be mailed to the patient at his request.      Burnadette Pop  10/16/2021  Patient declined after visit summry

## 2021-10-21 DIAGNOSIS — M4004 Postural kyphosis, thoracic region: Secondary | ICD-10-CM | POA: Diagnosis not present

## 2021-10-21 DIAGNOSIS — M47812 Spondylosis without myelopathy or radiculopathy, cervical region: Secondary | ICD-10-CM | POA: Diagnosis not present

## 2021-10-21 DIAGNOSIS — M9903 Segmental and somatic dysfunction of lumbar region: Secondary | ICD-10-CM | POA: Diagnosis not present

## 2021-10-21 DIAGNOSIS — M9902 Segmental and somatic dysfunction of thoracic region: Secondary | ICD-10-CM | POA: Diagnosis not present

## 2021-10-21 DIAGNOSIS — M9901 Segmental and somatic dysfunction of cervical region: Secondary | ICD-10-CM | POA: Diagnosis not present

## 2021-10-21 DIAGNOSIS — S338XXA Sprain of other parts of lumbar spine and pelvis, initial encounter: Secondary | ICD-10-CM | POA: Diagnosis not present

## 2021-10-28 DIAGNOSIS — M9903 Segmental and somatic dysfunction of lumbar region: Secondary | ICD-10-CM | POA: Diagnosis not present

## 2021-10-28 DIAGNOSIS — M47812 Spondylosis without myelopathy or radiculopathy, cervical region: Secondary | ICD-10-CM | POA: Diagnosis not present

## 2021-10-28 DIAGNOSIS — M4004 Postural kyphosis, thoracic region: Secondary | ICD-10-CM | POA: Diagnosis not present

## 2021-10-28 DIAGNOSIS — M9901 Segmental and somatic dysfunction of cervical region: Secondary | ICD-10-CM | POA: Diagnosis not present

## 2021-10-28 DIAGNOSIS — M9902 Segmental and somatic dysfunction of thoracic region: Secondary | ICD-10-CM | POA: Diagnosis not present

## 2021-10-28 DIAGNOSIS — S338XXA Sprain of other parts of lumbar spine and pelvis, initial encounter: Secondary | ICD-10-CM | POA: Diagnosis not present

## 2021-11-04 ENCOUNTER — Other Ambulatory Visit: Payer: Self-pay | Admitting: Family

## 2021-11-04 DIAGNOSIS — S338XXA Sprain of other parts of lumbar spine and pelvis, initial encounter: Secondary | ICD-10-CM | POA: Diagnosis not present

## 2021-11-04 DIAGNOSIS — M47812 Spondylosis without myelopathy or radiculopathy, cervical region: Secondary | ICD-10-CM | POA: Diagnosis not present

## 2021-11-04 DIAGNOSIS — M4004 Postural kyphosis, thoracic region: Secondary | ICD-10-CM | POA: Diagnosis not present

## 2021-11-04 DIAGNOSIS — M9902 Segmental and somatic dysfunction of thoracic region: Secondary | ICD-10-CM | POA: Diagnosis not present

## 2021-11-04 DIAGNOSIS — M9901 Segmental and somatic dysfunction of cervical region: Secondary | ICD-10-CM | POA: Diagnosis not present

## 2021-11-04 DIAGNOSIS — M9903 Segmental and somatic dysfunction of lumbar region: Secondary | ICD-10-CM | POA: Diagnosis not present

## 2021-11-14 DIAGNOSIS — M9903 Segmental and somatic dysfunction of lumbar region: Secondary | ICD-10-CM | POA: Diagnosis not present

## 2021-11-14 DIAGNOSIS — M9901 Segmental and somatic dysfunction of cervical region: Secondary | ICD-10-CM | POA: Diagnosis not present

## 2021-11-14 DIAGNOSIS — M4004 Postural kyphosis, thoracic region: Secondary | ICD-10-CM | POA: Diagnosis not present

## 2021-11-14 DIAGNOSIS — M9902 Segmental and somatic dysfunction of thoracic region: Secondary | ICD-10-CM | POA: Diagnosis not present

## 2021-11-14 DIAGNOSIS — S338XXA Sprain of other parts of lumbar spine and pelvis, initial encounter: Secondary | ICD-10-CM | POA: Diagnosis not present

## 2021-11-14 DIAGNOSIS — M47812 Spondylosis without myelopathy or radiculopathy, cervical region: Secondary | ICD-10-CM | POA: Diagnosis not present

## 2021-11-14 DIAGNOSIS — A488 Other specified bacterial diseases: Secondary | ICD-10-CM | POA: Diagnosis not present

## 2021-11-18 ENCOUNTER — Other Ambulatory Visit: Payer: Self-pay | Admitting: Family

## 2021-11-18 DIAGNOSIS — E559 Vitamin D deficiency, unspecified: Secondary | ICD-10-CM

## 2021-11-22 DIAGNOSIS — S338XXA Sprain of other parts of lumbar spine and pelvis, initial encounter: Secondary | ICD-10-CM | POA: Diagnosis not present

## 2021-11-22 DIAGNOSIS — M9902 Segmental and somatic dysfunction of thoracic region: Secondary | ICD-10-CM | POA: Diagnosis not present

## 2021-11-22 DIAGNOSIS — M47812 Spondylosis without myelopathy or radiculopathy, cervical region: Secondary | ICD-10-CM | POA: Diagnosis not present

## 2021-11-22 DIAGNOSIS — M9903 Segmental and somatic dysfunction of lumbar region: Secondary | ICD-10-CM | POA: Diagnosis not present

## 2021-11-22 DIAGNOSIS — M9901 Segmental and somatic dysfunction of cervical region: Secondary | ICD-10-CM | POA: Diagnosis not present

## 2021-11-22 DIAGNOSIS — M4004 Postural kyphosis, thoracic region: Secondary | ICD-10-CM | POA: Diagnosis not present

## 2021-11-28 DIAGNOSIS — M9903 Segmental and somatic dysfunction of lumbar region: Secondary | ICD-10-CM | POA: Diagnosis not present

## 2021-11-28 DIAGNOSIS — M9901 Segmental and somatic dysfunction of cervical region: Secondary | ICD-10-CM | POA: Diagnosis not present

## 2021-11-28 DIAGNOSIS — S338XXA Sprain of other parts of lumbar spine and pelvis, initial encounter: Secondary | ICD-10-CM | POA: Diagnosis not present

## 2021-11-28 DIAGNOSIS — M47812 Spondylosis without myelopathy or radiculopathy, cervical region: Secondary | ICD-10-CM | POA: Diagnosis not present

## 2021-11-28 DIAGNOSIS — M4004 Postural kyphosis, thoracic region: Secondary | ICD-10-CM | POA: Diagnosis not present

## 2021-11-28 DIAGNOSIS — M9902 Segmental and somatic dysfunction of thoracic region: Secondary | ICD-10-CM | POA: Diagnosis not present

## 2021-12-05 DIAGNOSIS — M9903 Segmental and somatic dysfunction of lumbar region: Secondary | ICD-10-CM | POA: Diagnosis not present

## 2021-12-05 DIAGNOSIS — S338XXA Sprain of other parts of lumbar spine and pelvis, initial encounter: Secondary | ICD-10-CM | POA: Diagnosis not present

## 2021-12-05 DIAGNOSIS — M9901 Segmental and somatic dysfunction of cervical region: Secondary | ICD-10-CM | POA: Diagnosis not present

## 2021-12-05 DIAGNOSIS — M9902 Segmental and somatic dysfunction of thoracic region: Secondary | ICD-10-CM | POA: Diagnosis not present

## 2021-12-05 DIAGNOSIS — M4004 Postural kyphosis, thoracic region: Secondary | ICD-10-CM | POA: Diagnosis not present

## 2021-12-05 DIAGNOSIS — M47812 Spondylosis without myelopathy or radiculopathy, cervical region: Secondary | ICD-10-CM | POA: Diagnosis not present

## 2021-12-12 ENCOUNTER — Encounter (HOSPITAL_BASED_OUTPATIENT_CLINIC_OR_DEPARTMENT_OTHER): Payer: Self-pay | Admitting: Internal Medicine

## 2021-12-12 ENCOUNTER — Ambulatory Visit (HOSPITAL_BASED_OUTPATIENT_CLINIC_OR_DEPARTMENT_OTHER): Payer: Medicare HMO | Admitting: Internal Medicine

## 2021-12-12 VITALS — BP 132/81 | HR 65 | Ht 75.0 in | Wt 216.1 lb

## 2021-12-12 DIAGNOSIS — I2510 Atherosclerotic heart disease of native coronary artery without angina pectoris: Secondary | ICD-10-CM

## 2021-12-12 DIAGNOSIS — M791 Myalgia, unspecified site: Secondary | ICD-10-CM

## 2021-12-12 DIAGNOSIS — E781 Pure hyperglyceridemia: Secondary | ICD-10-CM | POA: Diagnosis not present

## 2021-12-12 DIAGNOSIS — I700 Atherosclerosis of aorta: Secondary | ICD-10-CM | POA: Diagnosis not present

## 2021-12-12 DIAGNOSIS — T466X5A Adverse effect of antihyperlipidemic and antiarteriosclerotic drugs, initial encounter: Secondary | ICD-10-CM | POA: Diagnosis not present

## 2021-12-12 NOTE — Patient Instructions (Signed)
Medication Instructions:  Your physician recommends that you continue on your current medications as directed. Please refer to the Current Medication list given to you today.   *If you need a refill on your cardiac medications before your next appointment, please call your pharmacy*  Lab Work: [place]   If you have labs (blood work) drawn today and your tests are completely normal, you will receive your results only by: [place] (if you have MyChart) OR A paper copy in the mail If you have any lab test that is abnormal or we need to change your treatment, we will call you to review the results.  Testing/Procedures: NONE  Follow-Up: At [facility], you and your health needs are our priority.  As part of our continuing mission to provide you with exceptional heart care, we have created designated Provider Care Teams.  These Care Teams include your primary Cardiologist (physician) and Advanced Practice Providers (APPs -  Physician Assistants and Nurse Practitioners) who all work together to provide you with the care you need, when you need it.  We recommend signing up for the patient portal called "MyChart".  Sign up information is provided on this After Visit Summary.  MyChart is used to connect with patients for Virtual Visits (Telemedicine).  Patients are able to view lab/test results, encounter notes, upcoming appointments, etc.  Non-urgent messages can be sent to your provider as well.   To learn more about what you can do with MyChart, go to [email or web address].    Your next appointment:   4 month(s)  The format for your next appointment:   In Person  Provider:   K. Mali Hilty, MD AT [place]    Other Instructions PLEASE REVIEW THE [place]

## 2021-12-15 ENCOUNTER — Encounter (HOSPITAL_BASED_OUTPATIENT_CLINIC_OR_DEPARTMENT_OTHER): Payer: Self-pay | Admitting: Internal Medicine

## 2021-12-15 NOTE — Progress Notes (Signed)
LIPID CLINIC CONSULT NOTE  Chief Complaint:  Manage triglycerides  Primary Care Physician: Sharion Balloon, FNP  Primary Cardiologist:  None  HPI:  [name]. is a 71 y.o. male who is being seen today for the evaluation of high triglycerides at the request of Sharion Balloon, FNP. This is a send 71 year old male kindly referred for evaluation management of high triglycerides.  He reports a longstanding history of this.  Unfortunately he could not tolerate statins.  He had tried Lipitor in the past which caused muscle aches and more recently was on low-dose rosuvastatin but has not been taking it due to similar side effects.  His triglycerides have always run high in the 100s if not thousands.  Recent labs showed total cholesterol of 295, triglycerides 1371 and HDL 15.  LDL was not calculated.  His medication regimen includes fenofibrate 145 mg every day and Nexlizet 180/10 mg every day.  He reports trying to avoid saturated fats in his diet.  He does have known coronary disease including an occluded RCA and additional disease noted by cath.  He has been followed by Dr. Percival Spanish and was last seen in 2021.  PMHx:  Past Medical History:  Diagnosis Date   Angina at rest    Benign neoplasm of colon    CAD (coronary artery disease)     Occluded RCA, 50% Diat 2006, 2008   Diverticulosis of colon (without mention of hemorrhage)    Esophageal reflux    Other and unspecified hyperlipidemia    Unspecified disorder of thyroid     Past Surgical History:  Procedure Laterality Date   TONSILLECTOMY  1955    FAMHx:  Family History  Problem Relation Age of Onset   Hyperlipidemia Father    Heart disease Father        pacemaker / CHF    Dementia Maternal Grandmother    Cancer Maternal Grandfather        throat   Alzheimer's disease Paternal Grandmother    Cancer Paternal Grandfather        lung   Cancer Sister        breast   Colon cancer Neg Hx     SOCHx:   reports  that he quit smoking about 35 years ago. His smoking use included cigarettes. He smoked an average of .5 packs per day. He has never used smokeless tobacco. He reports that he does not currently use alcohol. He reports that he does not use drugs.  ALLERGIES:  Allergies  Allergen Reactions   Imdur [Isosorbide Nitrate]     Headache    Lipitor [Atorvastatin] Other (See Comments)    myalgias    ROS: Pertinent items noted in HPI and remainder of comprehensive ROS otherwise negative.  HOME MEDS: Current Outpatient Medications on File Prior to Visit  Medication Sig Dispense Refill   aspirin 81 MG EC tablet Take 81 mg by mouth daily.     fenofibrate (TRICOR) 145 MG tablet TAKE ONE (1) TABLET BY MOUTH EVERY DAY 30 tablet 3   fluticasone (FLONASE) 50 MCG/ACT nasal spray Place 2 sprays into both nostrils daily. 16 g 6   levothyroxine (SYNTHROID) 112 MCG tablet Take 1 tablet (112 mcg total) by mouth daily. 30 tablet 11   NEXLIZET 180-10 MG TABS TAKE ONE (1) TABLET BY MOUTH EVERY DAY 30 tablet 4   omeprazole (PRILOSEC) 20 MG capsule Take 2 caps po daily 180 capsule 3   ranolazine (RANEXA) 500 MG  12 hr tablet TAKE ONE (1) TABLET BY MOUTH TWO (2) TIMES DAILY 60 tablet 5   Vitamin D, Ergocalciferol, (DRISDOL) 1.25 MG (50000 UNIT) CAPS capsule TAKE 1 CAPSULE BY MOUTH EVERY 7 DAYS 12 capsule 0   No current facility-administered medications on file prior to visit.    LABS/IMAGING: No results found for this or any previous visit (from the past 48 hour(s)). No results found.  LIPID PANEL:    Component Value Date/Time   CHOL 295 (H) 09/17/2021 1203   CHOL 202 (H) 10/15/2012 0854   TRIG 1,371 (HH) 09/17/2021 1203   TRIG 590 (HH) 06/08/2014 1703   TRIG 241 (H) 10/15/2012 0854   HDL 15 (L) 09/17/2021 1203   HDL 43 06/08/2014 1703   HDL 39 (L) 10/15/2012 0854   CHOLHDL 19.7 (H) 09/17/2021 1203   CHOLHDL 5.9 CALC 01/18/2007 0846   VLDL 55 (H) 01/18/2007 0846   LDLCALC Comment (A) 09/17/2021 1203    LDLCALC 115 (H) 10/15/2012 0854   LDLDIRECT 72 05/13/2016 1100   LDLDIRECT 124.9 01/18/2007 0846    WEIGHTS: Wt Readings from Last 3 Encounters:  12/12/21 216 lb 1.6 oz (98 kg)  09/17/21 213 lb 12.8 oz (97 kg)  04/01/21 222 lb 9.6 oz (101 kg)    VITALS: BP 132/81 (BP Location: Left Arm, Patient Position: Sitting, Cuff Size: Large)   Pulse 65   Ht '6\' 3"'$  (1.905 m)   Wt 216 lb 1.6 oz (98 kg)   SpO2 96%   BMI 27.01 kg/m   EXAM: Deferred  EKG: Furred  ASSESSMENT: Mixed dyslipidemia with high triglycerides Coronary artery disease Obstructive sleep apnea Statin intolerant-myalgias  PLAN: 1.   Mr. Outten has a history of high triglycerides.  According to Dr. Rosezella [place] notes of Vascepa was not affordable for the patient.  He was then placed on Vytorin but had side effects with this.  He really is statin intolerant, from what I can tell.  He seems to be doing well with Nexlizet and fenofibrate.  He was willing to retry Vascepa based on updated lab work which we will obtain.  This might be covered by a health well grant which is funded at this time.  Otherwise, options include clinical trials for which she would likely be a candidate.  We will reach out to [place] to see if he might be a candidate for CS 5 or CS 9 based on his updated labs which are pending.  Plan follow-up with me in 3-4 months. Thanks again for the kind referral.  Pixie Casino, MD, FACC, [place] Director of the Advanced Lipid Disorders &  Cardiovascular Risk Reduction Clinic Diplomate of the American Board of Clinical Lipidology Attending Cardiologist  Direct Dial: [phone]  Fax: [phone]  Website:  [email or web address] 12/15/2021, 7:49 PM

## 2021-12-16 ENCOUNTER — Encounter: Payer: Self-pay | Admitting: Family

## 2021-12-16 ENCOUNTER — Ambulatory Visit (INDEPENDENT_AMBULATORY_CARE_PROVIDER_SITE_OTHER): Payer: Medicare HMO | Admitting: Family

## 2021-12-16 DIAGNOSIS — J069 Acute upper respiratory infection, unspecified: Secondary | ICD-10-CM

## 2021-12-16 MED ORDER — PREDNISONE 10 MG (21) PO TBPK: ORAL_TABLET | ORAL | 0 refills | Status: DC

## 2021-12-16 MED ORDER — BENZONATATE 200 MG PO CAPS: 200.0000 mg | ORAL_CAPSULE | Freq: Three times a day (TID) | ORAL | 1 refills | Status: DC | PRN

## 2021-12-16 NOTE — Progress Notes (Signed)
Virtual Visit  Note Due to COVID-19 pandemic this visit was conducted virtually. This visit type was conducted due to national recommendations for restrictions regarding the COVID-19 Pandemic (e.g. social distancing, sheltering in place) in an effort to limit this patient's exposure and mitigate transmission in our community. All issues noted in this document were discussed and addressed.  A physical exam was not performed with this format.  I connected with Corey Ochs. on 12/16/21 at 12:08 pm by telephone and verified that I am speaking with the correct person using two identifiers. Corey Ochs. is currently located at home and no one is currently with him during visit. The provider, Evelina Dun, FNP is located in their office at time of visit.  I discussed the limitations, risks, security and privacy concerns of performing an evaluation and management service by telephone and the availability of in person appointments. I also discussed with the patient that there may be a patient responsible charge related to this service. The patient expressed understanding and agreed to proceed.   [name], [name] are scheduled for a virtual visit with your provider today.    Just as we do with appointments in the office, we must obtain your consent to participate.  Your consent will be active for this visit and any virtual visit you may have with one of our providers in the next 365 days.    If you have a MyChart account, I can also send a copy of this consent to you electronically.  All virtual visits are billed to your insurance company just like a traditional visit in the office.  As this is a virtual visit, video technology does not allow for your provider to perform a traditional examination.  This may limit your provider's ability to fully assess your condition.  If your provider identifies any concerns that need to be evaluated in person or the need to arrange testing such as labs, EKG,  etc, we will make arrangements to do so.    Although advances in technology are sophisticated, we cannot ensure that it will always work on either your end or our end.  If the connection with a video visit is poor, we may have to switch to a telephone visit.  With either a video or telephone visit, we are not always able to ensure that we have a secure connection.   I need to obtain your verbal consent now.   Are you willing to proceed with your visit today?   Corey Rosier. has provided verbal consent on 12/16/2021 for a virtual visit (video or telephone).   Evelina Dun,  12/16/2021  12:11 PM   History and Present Illness:  Pt calls the office today today with a cough that started yesterday.  Cough This is a new problem. The current episode started yesterday. The problem has been gradually worsening. The problem occurs every few minutes. The cough is Productive of sputum. Associated symptoms include nasal congestion, postnasal drip, a sore throat and wheezing. Pertinent negatives include no chills, ear congestion, ear pain, fever, headaches, myalgias or shortness of breath. He has tried rest and OTC cough suppressant for the symptoms. The treatment provided mild relief.      Review of Systems  Constitutional:  Negative for chills and fever.  HENT:  Positive for postnasal drip and sore throat. Negative for ear pain.   Respiratory:  Positive for cough and wheezing. Negative for shortness of breath.   Musculoskeletal:  Negative for  myalgias.  Neurological:  Negative for headaches.  All other systems reviewed and are negative.    Observations/Objective: Nasal congestion, hoarse voice, no SOB or distress noted   Assessment and Plan: 1. Viral URI with cough - Take meds as prescribed - Use a cool mist humidifier  -Use saline nose sprays frequently -Force fluids -For any cough or congestion  Use plain Mucinex- regular strength or max strength is fine -For fever or aces or  pains- take tylenol or ibuprofen. -Throat lozenges if help -Follow up if symptoms worsen or do not improve  - predniSONE (STERAPRED UNI-PAK 21 TAB) 10 MG (21) TBPK tablet; Use as directed  Dispense: 21 tablet; Refill: 0 - benzonatate (TESSALON) 200 MG capsule; Take 1 capsule (200 mg total) by mouth 3 (three) times daily as needed.  Dispense: 30 capsule; Refill: 1     I discussed the assessment and treatment plan with the patient. The patient was provided an opportunity to ask questions and all were answered. The patient agreed with the plan and demonstrated an understanding of the instructions.   The patient was advised to call back or seek an in-person evaluation if the symptoms worsen or if the condition fails to improve as anticipated.  The above assessment and management plan was discussed with the patient. The patient verbalized understanding of and has agreed to the management plan. Patient is aware to call the clinic if symptoms persist or worsen. Patient is aware when to return to the clinic for a follow-up visit. Patient educated on when it is appropriate to go to the emergency department.   Time call ended:  12:19 pm   I provided 11 minutes of  non face-to-face time during this encounter.    Evelina Dun, FNP

## 2021-12-19 ENCOUNTER — Other Ambulatory Visit: Payer: Self-pay | Admitting: Family

## 2021-12-20 ENCOUNTER — Telehealth: Payer: Self-pay | Admitting: Family

## 2021-12-20 MED ORDER — DOXYCYCLINE HYCLATE 100 MG PO TABS: 100.0000 mg | ORAL_TABLET | Freq: Two times a day (BID) | ORAL | 0 refills | Status: DC

## 2021-12-20 NOTE — Telephone Encounter (Signed)
Patient calling because he is not feeling any better, wants to know if an antibiotic can be called in to [facility]. Had an televisit on 11/6

## 2021-12-20 NOTE — Telephone Encounter (Signed)
Doxycycline Prescription sent to pharmacy   

## 2021-12-20 NOTE — Telephone Encounter (Signed)
Patient aware and verbalized understanding. °

## 2022-01-07 ENCOUNTER — Telehealth: Payer: Medicare HMO

## 2022-01-08 ENCOUNTER — Telehealth: Payer: Medicare HMO

## 2022-01-09 ENCOUNTER — Telehealth: Payer: Self-pay | Admitting: Family

## 2022-01-09 DIAGNOSIS — E1165 Type 2 diabetes mellitus with hyperglycemia: Secondary | ICD-10-CM

## 2022-01-15 ENCOUNTER — Other Ambulatory Visit: Payer: Self-pay | Admitting: Family

## 2022-01-15 DIAGNOSIS — E782 Mixed hyperlipidemia: Secondary | ICD-10-CM

## 2022-01-16 ENCOUNTER — Telehealth: Payer: Self-pay | Admitting: Pharmacist

## 2022-01-16 NOTE — Telephone Encounter (Signed)
Placed grant approval in envelop up front for patient to pick up Approved until 2024

## 2022-01-20 ENCOUNTER — Encounter: Payer: Self-pay | Admitting: Family

## 2022-01-20 ENCOUNTER — Ambulatory Visit (INDEPENDENT_AMBULATORY_CARE_PROVIDER_SITE_OTHER): Payer: Medicare HMO | Admitting: Family

## 2022-01-20 VITALS — BP 130/77 | HR 69 | Temp 97.8°F | Resp 18 | Ht 75.0 in | Wt 214.0 lb

## 2022-01-20 DIAGNOSIS — Z0001 Encounter for general adult medical examination with abnormal findings: Secondary | ICD-10-CM

## 2022-01-20 DIAGNOSIS — Z0000 Encounter for general adult medical examination without abnormal findings: Secondary | ICD-10-CM

## 2022-01-20 DIAGNOSIS — K219 Gastro-esophageal reflux disease without esophagitis: Secondary | ICD-10-CM | POA: Diagnosis not present

## 2022-01-20 DIAGNOSIS — E039 Hypothyroidism, unspecified: Secondary | ICD-10-CM

## 2022-01-20 DIAGNOSIS — I25111 Atherosclerotic heart disease of native coronary artery with angina pectoris with documented spasm: Secondary | ICD-10-CM | POA: Diagnosis not present

## 2022-01-20 DIAGNOSIS — E559 Vitamin D deficiency, unspecified: Secondary | ICD-10-CM

## 2022-01-20 DIAGNOSIS — E782 Mixed hyperlipidemia: Secondary | ICD-10-CM

## 2022-01-20 DIAGNOSIS — E781 Pure hyperglyceridemia: Secondary | ICD-10-CM | POA: Diagnosis not present

## 2022-01-20 DIAGNOSIS — Z23 Encounter for immunization: Secondary | ICD-10-CM | POA: Diagnosis not present

## 2022-01-20 DIAGNOSIS — Z1211 Encounter for screening for malignant neoplasm of colon: Secondary | ICD-10-CM

## 2022-01-20 DIAGNOSIS — E1165 Type 2 diabetes mellitus with hyperglycemia: Secondary | ICD-10-CM

## 2022-01-20 DIAGNOSIS — G4733 Obstructive sleep apnea (adult) (pediatric): Secondary | ICD-10-CM

## 2022-01-20 DIAGNOSIS — I2583 Coronary atherosclerosis due to lipid rich plaque: Secondary | ICD-10-CM

## 2022-01-20 DIAGNOSIS — M791 Myalgia, unspecified site: Secondary | ICD-10-CM | POA: Diagnosis not present

## 2022-01-20 DIAGNOSIS — I2510 Atherosclerotic heart disease of native coronary artery without angina pectoris: Secondary | ICD-10-CM | POA: Diagnosis not present

## 2022-01-20 DIAGNOSIS — T466X5A Adverse effect of antihyperlipidemic and antiarteriosclerotic drugs, initial encounter: Secondary | ICD-10-CM

## 2022-01-20 DIAGNOSIS — K429 Umbilical hernia without obstruction or gangrene: Secondary | ICD-10-CM

## 2022-01-20 DIAGNOSIS — I700 Atherosclerosis of aorta: Secondary | ICD-10-CM | POA: Diagnosis not present

## 2022-01-20 LAB — BAYER DCA HB A1C WAIVED: HB A1C (BAYER DCA - WAIVED): 6.9 % — ABNORMAL HIGH (ref 4.8–5.6)

## 2022-01-20 NOTE — Addendum Note (Signed)
Addended by: Brynda Peon F on: 01/20/2022 12:23 PM   Modules accepted: Orders

## 2022-01-20 NOTE — Progress Notes (Signed)
Subjective:    Patient ID: [name]., male    DOB: [birth year], 71 y.o.   MRN: [id number]  Chief Complaint  Patient presents with   Medical Management of Chronic Issues    Pt presents to the office today for CPE and chronic follow up.  He is followed by Cardiologists every 3-4 months for CAD.    He has OSA and uses CPAP at times.    He has aortic atherosclerosis and CAD  but can not tolerate statin related to myalgia.  Gastroesophageal Reflux He complains of belching, heartburn and a hoarse voice. This is a chronic problem. The current episode started more than 1 year ago. The problem occurs occasionally. Pertinent negatives include no fatigue. He has tried a PPI for the symptoms. The treatment provided moderate relief.  Diabetes He presents for his follow-up diabetic visit. He has type 2 diabetes mellitus. Pertinent negatives for diabetes include no blurred vision, no fatigue and no foot paresthesias. Symptoms are stable. Risk factors for coronary artery disease include dyslipidemia, diabetes mellitus, hypertension, male sex and sedentary lifestyle. He is following a generally unhealthy diet. (Does not check BS at home) Eye exam is not current.  Thyroid Problem Presents for follow-up visit. Symptoms include hoarse voice. Patient reports no cold intolerance, diarrhea or fatigue. The symptoms have been stable. His past medical history is significant for hyperlipidemia.  Hyperlipidemia This is a chronic problem. The current episode started more than 1 year ago. The problem is uncontrolled. Recent lipid tests were reviewed and are high. Exacerbating diseases include obesity. Current antihyperlipidemic treatment includes diet change. The current treatment provides no improvement of lipids. Risk factors for coronary artery disease include dyslipidemia, diabetes mellitus, hypertension, male sex and a sedentary lifestyle.      Review of Systems  Constitutional:  Negative for fatigue.   HENT:  Positive for hoarse voice.   Eyes:  Negative for blurred vision.  Gastrointestinal:  Positive for heartburn. Negative for diarrhea.  Endocrine: Negative for cold intolerance.  All other systems reviewed and are negative.   Family History  Problem Relation Age of Onset   Hyperlipidemia Father    Heart disease Father        pacemaker / CHF    Dementia Maternal Grandmother    Cancer Maternal Grandfather        throat   Alzheimer's disease Paternal Grandmother    Cancer Paternal Grandfather        lung   Cancer Sister        breast   Colon cancer Neg Hx    Social History   Socioeconomic History   Marital status: Widowed    Spouse name: Not on file   Number of children: 3   Years of education: 16   Highest education level: Bachelor's degree (e.g., BA, AB, BS)  Occupational History   Occupation: retired     Comment: golf / club prefessional   Tobacco Use   Smoking status: Former    Packs/day: 0.50    Types: Cigarettes    Quit date: 07/22/1986    Years since quitting: 35.5   Smokeless tobacco: Never  Vaping Use   Vaping Use: Never used  Substance and Sexual Activity   Alcohol use: Not Currently   Drug use: No   Sexual activity: Not Currently  Other Topics Concern   Not on file  Social History Narrative   Retired, widowed, lives alone. 3 grown children. Enjoys golf.    Social Determinants  of Health   Financial Resource Strain: Low Risk  (10/12/2020)   Overall Financial Resource Strain (CARDIA)    Difficulty of Paying Living Expenses: Not hard at all  Food Insecurity: No Food Insecurity (10/12/2020)   Hunger Vital Sign    Worried About Running Out of Food in the Last Year: Never true    Ran Out of Food in the Last Year: Never true  Transportation Needs: No Transportation Needs (10/12/2020)   PRAPARE - Hydrologist (Medical): No    Lack of Transportation (Non-Medical): No  Physical Activity: Sufficiently Active (10/12/2020)   Exercise  Vital Sign    Days of Exercise per Week: 5 days    Minutes of Exercise per Session: 30 min  Stress: No Stress Concern Present (10/12/2020)   [place]    Feeling of Stress : Not at all  Social Connections: Moderately Integrated (10/12/2020)   Social Connection and Isolation Panel [NHANES]    Frequency of Communication with Friends and Family: More than three times a week    Frequency of Social Gatherings with Friends and Family: More than three times a week    Attends Religious Services: More than 4 times per year    Active Member of [employer] or Organizations: Yes    Attends Archivist Meetings: More than 4 times per year    Marital Status: Widowed       Objective:   Physical Exam Vitals reviewed.  Constitutional:      General: He is not in acute distress.    Appearance: He is well-developed. He is obese.  HENT:     Head: Normocephalic.     Right Ear: Tympanic membrane normal.     Left Ear: Tympanic membrane normal.  Eyes:     General:        Right eye: No discharge.        Left eye: No discharge.     Pupils: Pupils are equal, round, and reactive to light.  Neck:     Thyroid: No thyromegaly.  Cardiovascular:     Rate and Rhythm: Normal rate and regular rhythm.     Heart sounds: Normal heart sounds. No murmur heard. Pulmonary:     Effort: Pulmonary effort is normal. No respiratory distress.     Breath sounds: Normal breath sounds. No wheezing.  Abdominal:     General: Bowel sounds are normal. There is no distension.     Palpations: Abdomen is soft.     Tenderness: There is no abdominal tenderness.     Hernia: A hernia is present. Hernia is present in the umbilical area.    Musculoskeletal:        General: No tenderness. Normal range of motion.     Cervical back: Normal range of motion and neck supple.  Skin:    General: Skin is warm and dry.     Findings: No erythema or rash.  Neurological:      Mental Status: He is alert and oriented to person, place, and time.     Cranial Nerves: No cranial nerve deficit.     Deep Tendon Reflexes: Reflexes are normal and symmetric.  Psychiatric:        Behavior: Behavior normal.        Thought Content: Thought content normal.        Judgment: Judgment normal.       BP 130/77   Pulse 69  Temp 97.8 F (36.6 C) (Temporal)   Resp 18   Ht _0  (1.905 m)   Wt 214 lb (97.1 kg)   SpO2 98%   BMI 26.75 kg/m      Assessment & Plan:  [name]. comes in today with chief complaint of Medical Management of Chronic Issues   Diagnosis and orders addressed:  1. Annual physical exam - CMP14+EGFR - CBC with Differential/Platelet - Bayer DCA Hb A1c Waived - Microalbumin / creatinine urine ratio - TSH - Ambulatory referral to Gastroenterology  2. Aortic atherosclerosis (HCC) - CMP14+EGFR - CBC with Differential/Platelet  3. Coronary artery disease due to lipid rich plaque - CMP14+EGFR - CBC with Differential/Platelet  4. Need for immunization against influenza - Flu Vaccine QUAD 17moIM (Fluarix, Fluzone & Alfiuria Quad PF) - CMP14+EGFR - CBC with Differential/Platelet  5. Gastroesophageal reflux disease without esophagitis  - CMP14+EGFR - CBC with Differential/Platelet  6. Acquired hypothyroidism - CMP14+EGFR - CBC with Differential/Platelet  7. Mixed hyperlipidemia - CMP14+EGFR - CBC with Differential/Platelet  8. Myalgia due to statin - CMP14+EGFR - CBC with Differential/Platelet  9. Type 2 diabetes mellitus with hyperglycemia, without long-term current use of insulin (HCC) - CMP14+EGFR - CBC with Differential/Platelet - Bayer DCA Hb A1c Waived - Microalbumin / creatinine urine ratio  10. Vitamin D deficiency - CMP14+EGFR - CBC with Differential/Platelet  11. Obstructive sleep apnea - CMP14+EGFR - CBC with Differential/Platelet  12. Coronary artery disease involving native coronary artery of native  heart with angina pectoris with documented spasm (HCC) - CMP14+EGFR - CBC with Differential/Platelet  13. Colon cancer screening - CMP14+EGFR - CBC with Differential/Platelet - Ambulatory referral to Gastroenterology  14. Umbilical hernia without obstruction and without gangrene - Ambulatory referral to General Surgery   Labs pending Health Maintenance reviewed Diet and exercise encouraged  Follow up plan: 6 months    CEvelina Dun FNP

## 2022-01-20 NOTE — Patient Instructions (Signed)
Health Maintenance After Age 73 After age 73, you are at a higher risk for certain long-term diseases and infections as well as injuries from falls. Falls are a major cause of broken bones and head injuries in people who are older than age 73. Getting regular preventive care can help to keep you healthy and well. Preventive care includes getting regular testing and making lifestyle changes as recommended by your health care provider. Talk with your health care provider about: Which screenings and tests you should have. A screening is a test that checks for a disease when you have no symptoms. A diet and exercise plan that is right for you. What should I know about screenings and tests to prevent falls? Screening and testing are the best ways to find a health problem early. Early diagnosis and treatment give you the best chance of managing medical conditions that are common after age 73. Certain conditions and lifestyle choices may make you more likely to have a fall. Your health care provider may recommend: Regular vision checks. Poor vision and conditions such as cataracts can make you more likely to have a fall. If you wear glasses, make sure to get your prescription updated if your vision changes. Medicine review. Work with your health care provider to regularly review all of the medicines you are taking, including over-the-counter medicines. Ask your health care provider about any side effects that may make you more likely to have a fall. Tell your health care provider if any medicines that you take make you feel dizzy or sleepy. Strength and balance checks. Your health care provider may recommend certain tests to check your strength and balance while standing, walking, or changing positions. Foot health exam. Foot pain and numbness, as well as not wearing proper footwear, can make you more likely to have a fall. Screenings, including: Osteoporosis screening. Osteoporosis is a condition that causes  the bones to get weaker and break more easily. Blood pressure screening. Blood pressure changes and medicines to control blood pressure can make you feel dizzy. Depression screening. You may be more likely to have a fall if you have a fear of falling, feel depressed, or feel unable to do activities that you used to do. Alcohol use screening. Using too much alcohol can affect your balance and may make you more likely to have a fall. Follow these instructions at home: Lifestyle Do not drink alcohol if: Your health care provider tells you not to drink. If you drink alcohol: Limit how much you have to: 0-1 drink a day for women. 0-2 drinks a day for men. Know how much alcohol is in your drink. In the [place] one drink equals one 12 oz bottle of beer (355 mL), one 5 oz glass of wine (148 mL), or one 1 oz glass of hard liquor (44 mL). Do not use any products that contain nicotine or tobacco. These products include cigarettes, chewing tobacco, and vaping devices, such as e-cigarettes. If you need help quitting, ask your health care provider. Activity  Follow a regular exercise program to stay fit. This will help you maintain your balance. Ask your health care provider what types of exercise are appropriate for you. If you need a cane or walker, use it as recommended by your health care provider. Wear supportive shoes that have nonskid soles. Safety  Remove any tripping hazards, such as rugs, cords, and clutter. Install safety equipment such as grab bars in bathrooms and safety rails on stairs. Keep rooms and walkways   well-lit. General instructions Talk with your health care provider about your risks for falling. Tell your health care provider if: You fall. Be sure to tell your health care provider about all falls, even ones that seem minor. You feel dizzy, tiredness (fatigue), or off-balance. Take over-the-counter and prescription medicines only as told by your health care provider. These include  supplements. Eat a healthy diet and maintain a healthy weight. A healthy diet includes low-fat dairy products, low-fat (lean) meats, and fiber from whole grains, beans, and lots of fruits and vegetables. Stay current with your vaccines. Schedule regular health, dental, and eye exams. Summary Having a healthy lifestyle and getting preventive care can help to protect your health and wellness after age 73. Screening and testing are the best way to find a health problem early and help you avoid having a fall. Early diagnosis and treatment give you the best chance for managing medical conditions that are more common for people who are older than age 73. Falls are a major cause of broken bones and head injuries in people who are older than age 73. Take precautions to prevent a fall at home. Work with your health care provider to learn what changes you can make to improve your health and wellness and to prevent falls. This information is not intended to replace advice given to you by your health care provider. Make sure you discuss any questions you have with your health care provider. Document Revised: 06/18/2020 Document Reviewed: 06/18/2020 Elsevier Patient Education  2023 Elsevier Inc.  

## 2022-01-21 ENCOUNTER — Encounter: Payer: Self-pay | Admitting: *Deleted

## 2022-01-21 ENCOUNTER — Other Ambulatory Visit: Payer: Self-pay | Admitting: Family

## 2022-01-21 DIAGNOSIS — Z006 Encounter for examination for normal comparison and control in clinical research program: Secondary | ICD-10-CM

## 2022-01-21 LAB — LIPID PANEL
Chol/HDL Ratio: 54.9 ratio — ABNORMAL HIGH (ref 0.0–5.0)
Cholesterol, Total: 384 mg/dL — ABNORMAL HIGH (ref 100–199)
HDL: 7 mg/dL — ABNORMAL LOW (ref >39)
Triglycerides: 1692 mg/dL (ref 0–149)

## 2022-01-21 LAB — MICROALBUMIN / CREATININE URINE RATIO
Creatinine, Urine: 154.1 mg/dL
Microalb/Creat Ratio: 18 mg/g creat (ref 0–29)
Microalbumin, Urine: 27.4 ug/mL

## 2022-01-21 LAB — APOLIPOPROTEIN B: Apolipoprotein B: 213 mg/dL — ABNORMAL HIGH (ref <90)

## 2022-01-21 LAB — LIPOPROTEIN A (LPA)

## 2022-01-21 MED ORDER — METFORMIN HCL 500 MG PO TABS: 500.0000 mg | ORAL_TABLET | Freq: Two times a day (BID) | ORAL | 1 refills | Status: DC

## 2022-01-21 NOTE — Research (Signed)
Message left for [name] about [employer] research. Encouraged him to call me back for more information.

## 2022-01-23 LAB — CMP14+EGFR
ALT: 35 IU/L (ref 0–44)
AST: 44 IU/L — ABNORMAL HIGH (ref 0–40)
Albumin/Globulin Ratio: 1.5 (ref 1.2–2.2)
Albumin: 4.4 g/dL (ref 3.8–4.8)
Alkaline Phosphatase: 67 IU/L (ref 44–121)
BUN/Creatinine Ratio: 17 (ref 10–24)
BUN: 19 mg/dL (ref 8–27)
Bilirubin Total: 0.4 mg/dL (ref 0.0–1.2)
CO2: 23 mmol/L (ref 20–29)
Calcium: 9.3 mg/dL (ref 8.6–10.2)
Chloride: 98 mmol/L (ref 96–106)
Creatinine, Ser: 1.14 mg/dL (ref 0.76–1.27)
Globulin, Total: 2.9 g/dL (ref 1.5–4.5)
Glucose: 121 mg/dL — ABNORMAL HIGH (ref 70–99)
Potassium: 4.3 mmol/L (ref 3.5–5.2)
Sodium: 137 mmol/L (ref 134–144)
Total Protein: 7.3 g/dL (ref 6.0–8.5)
eGFR: 68 mL/min/{1.73_m2} (ref >59)

## 2022-01-23 LAB — CBC WITH DIFFERENTIAL/PLATELET
Basophils Absolute: 0 10*3/uL (ref 0.0–0.2)
Basos: 1 %
EOS (ABSOLUTE): 0.3 10*3/uL (ref 0.0–0.4)
Eos: 7 %
Hematocrit: 35.6 % — ABNORMAL LOW (ref 37.5–51.0)
Hemoglobin: 12.4 g/dL — ABNORMAL LOW (ref 13.0–17.7)
Immature Grans (Abs): 0.1 10*3/uL (ref 0.0–0.1)
Immature Granulocytes: 1 %
Lymphocytes Absolute: 1.4 10*3/uL (ref 0.7–3.1)
Lymphs: 34 %
MCH: 30.5 pg (ref 26.6–33.0)
MCHC: 34.8 g/dL (ref 31.5–35.7)
MCV: 88 fL (ref 79–97)
Monocytes Absolute: 0.3 10*3/uL (ref 0.1–0.9)
Monocytes: 8 %
Neutrophils Absolute: 2 10*3/uL (ref 1.4–7.0)
Neutrophils: 49 %
Platelets: 318 10*3/uL (ref 150–450)
RBC: 4.07 x10E6/uL — ABNORMAL LOW (ref 4.14–5.80)
RDW: 14.7 % (ref 11.6–15.4)
WBC: 4.1 10*3/uL (ref 3.4–10.8)

## 2022-01-23 LAB — TSH: TSH: 1.84 u[IU]/mL (ref 0.450–4.500)

## 2022-01-23 NOTE — Telephone Encounter (Signed)
-----   Message from Delene Ruffini, CPhT sent at 01/23/2022  9:39 AM EST ----- I dont do any scheduling for patient assistance... Almyra Free or office staff would have to do so :) ----- Message ----- From: Everlean Cherry, CMA Sent: 01/21/2022   1:45 PM EST To: Delene Ruffini, CPhT  Can you schedule patient

## 2022-01-27 ENCOUNTER — Other Ambulatory Visit: Payer: Self-pay | Admitting: *Deleted

## 2022-01-27 MED ORDER — ICOSAPENT ETHYL 1 G PO CAPS: 2.0000 g | ORAL_CAPSULE | Freq: Two times a day (BID) | ORAL | 11 refills | Status: DC

## 2022-01-28 ENCOUNTER — Ambulatory Visit: Payer: Medicare HMO | Admitting: General Surgery

## 2022-01-28 ENCOUNTER — Encounter: Payer: Self-pay | Admitting: *Deleted

## 2022-01-28 ENCOUNTER — Encounter: Payer: Self-pay | Admitting: General Surgery

## 2022-01-28 VITALS — BP 131/72 | HR 60 | Temp 98.1°F | Resp 14 | Ht 75.0 in | Wt 213.0 lb

## 2022-01-28 DIAGNOSIS — K429 Umbilical hernia without obstruction or gangrene: Secondary | ICD-10-CM

## 2022-01-28 DIAGNOSIS — Z006 Encounter for examination for normal comparison and control in clinical research program: Secondary | ICD-10-CM

## 2022-01-28 NOTE — Patient Instructions (Signed)
Open Hernia Repair, Adult Open hernia repair is a surgical procedure to fix a hernia. A hernia occurs when an internal organ or tissue pushes through a weak spot in the muscles along the wall of the abdomen. Hernias commonly occur in the groin and around the belly button. Most hernias tend to get worse over time. Often, surgery is done to prevent the hernia from becoming bigger, uncomfortable, or an emergency. Emergency surgery may be needed if contents of the abdomen get stuck in the opening (incarcerated hernia) or if the blood supply gets cut off (strangulated hernia). In an open repair, an incision is made in the abdomen to perform the surgery. Tell a health care provider about: Any allergies you have. All medicines you are taking, including vitamins, herbs, eye drops, creams, and over-the-counter medicines. Any problems you or family members have had with anesthetic medicines. Any blood or bone disorders you have. Any surgeries you have had. Any medical conditions you have, including any recent cold or flu (influenza)symptoms. Whether you are pregnant or may be pregnant. What are the risks? Generally, this is a safe procedure. However, problems may occur, including: Long-lasting (chronic) pain. Bleeding. Infection. Damage to the testicles. This can cause shrinking or swelling. Damage to nearby structures or organs, including the bladder, blood vessels, intestines, or nerves near the hernia. Blood clots. Trouble passing urine. Return of the hernia. What happens before the procedure? Medicines Ask your health care provider about: Changing or stopping your regular medicines. This is especially important if you are taking diabetes medicines or blood thinners. Taking medicines such as aspirin and ibuprofen. These medicines can thin your blood. Do not take these medicines unless your health care provider tells you to take them. Taking over-the-counter medicines, vitamins, herbs, and  supplements. Surgery safety Ask your health care provider: How your surgery site will be marked. What steps will be taken to help prevent infection. These steps may include: Removing hair at the surgery site. Washing skin with a germ-killing soap. Receiving antibiotic medicine. General instructions You may have an exam or testing, such as blood tests or imaging studies. Do not use any products that contain nicotine or tobacco for at least 4 weeks before the procedure. These products include cigarettes, chewing tobacco, and vaping devices, such as e-cigarettes. If you need help quitting, ask your health care provider. Let your health care provider know if you develop a cold or any infection before your surgery. If you get an infection before surgery, you may receive antibiotics to treat it. Plan to have a responsible adult take you home from the hospital or clinic. If you will be going home right after the procedure, plan to have a responsible adult care for you for the time you are told. This is important. What happens during the procedure?  An IV will be inserted into one of your veins. You will be given one or more of the following: A medicine to help you relax (sedative). A medicine to numb the area (local anesthetic). A medicine to make you fall asleep (general anesthetic). Your surgeon will make an incision over the hernia. The tissues of the hernia will be moved back into place. The edges of the hernia may be stitched (sutured) together. The opening in the abdominal muscles will be closed with stitches (sutures). Or, your surgeon will place a mesh patch made of artificial (synthetic) material over the opening. The incision will be closed with sutures, skin glue, or adhesive strips. A bandage (dressing) may be  placed over the incision. The procedure may vary among health care providers and hospitals. What happens after the procedure? Your blood pressure, heart rate, breathing rate,  and blood oxygen level will be monitored until you leave the hospital or clinic. You may be given medicine for pain. If you were given a sedative during the procedure, it can affect you for several hours. Do not drive or operate machinery until your health care provider says that it is safe. Summary Open hernia repair is a surgical procedure to fix a hernia. Hernias commonly occur in the groin and around the belly button. Emergency surgery may be needed if contents of the abdomen get stuck in the opening (incarcerated hernia) or if the blood supply gets cut off (strangulated hernia). In this procedure, an incision is made in the abdomen to perform the surgery. After the procedure, you may be given medicine for pain. This information is not intended to replace advice given to you by your health care provider. Make sure you discuss any questions you have with your health care provider. Document Revised: 09/12/2019 Document Reviewed: 09/12/2019 Elsevier Patient Education  [employer].

## 2022-01-28 NOTE — Progress Notes (Signed)
[facility] History and Physical  Reason for Referral:*** Referring Physician: ***  Chief Complaint   New Patient (Initial Visit)     Corey Cokley. is a 71 y.o. male.  HPI: ***.  The *** started *** and has had a duration of ***.  It is associated with ***.  The *** is improved with ***, and is made worse with ***.    Quality*** Context***  Past Medical History:  Diagnosis Date  . Angina at rest   . Benign neoplasm of colon   . CAD (coronary artery disease)     Occluded RCA, 50% Diat 2006, 2008  . Diverticulosis of colon (without mention of hemorrhage)   . Esophageal reflux   . Other and unspecified hyperlipidemia   . Unspecified disorder of thyroid     Past Surgical History:  Procedure Laterality Date  . TONSILLECTOMY  1955    Family History  Problem Relation Age of Onset  . Hyperlipidemia Father   . Heart disease Father        pacemaker / CHF   . Dementia Maternal Grandmother   . Cancer Maternal Grandfather        throat  . Alzheimer's disease Paternal Grandmother   . Cancer Paternal Grandfather        lung  . Cancer Sister        breast  . Colon cancer Neg Hx     Social History   Tobacco Use  . Smoking status: Former    Packs/day: 0.50    Types: Cigarettes    Quit date: 07/22/1986    Years since quitting: 35.5  . Smokeless tobacco: Never  Vaping Use  . Vaping Use: Never used  Substance Use Topics  . Alcohol use: Not Currently  . Drug use: No    Medications: {medication reviewed/display:3041432} Allergies as of 01/28/2022       Reactions   Imdur [isosorbide Nitrate]    Headache   Lipitor [atorvastatin] Other (See Comments)   myalgias        Medication List        Accurate as of January 28, 2022 11:59 AM. If you have any questions, ask your nurse or doctor.          STOP taking these medications    icosapent Ethyl 1 g capsule Commonly known as: Vascepa Stopped by: Virl Cagey, MD        TAKE these medications    aspirin EC 81 MG tablet Take 81 mg by mouth daily.   fenofibrate 145 MG tablet Commonly known as: TRICOR TAKE ONE (1) TABLET BY MOUTH EVERY DAY   fluticasone 50 MCG/ACT nasal spray Commonly known as: FLONASE Place 2 sprays into both nostrils daily.   levothyroxine 112 MCG tablet Commonly known as: SYNTHROID TAKE ONE (1) TABLET BY MOUTH EVERY DAY   metFORMIN 500 MG tablet Commonly known as: GLUCOPHAGE Take 1 tablet (500 mg total) by mouth 2 (two) times daily with a meal.   Nexlizet 180-10 MG Tabs Generic drug: Bempedoic Acid-Ezetimibe TAKE ONE (1) TABLET BY MOUTH EVERY DAY   omeprazole 20 MG capsule Commonly known as: PRILOSEC Take 2 caps po daily   ranolazine 500 MG 12 hr tablet Commonly known as: RANEXA TAKE ONE (1) TABLET BY MOUTH TWO (2) TIMES DAILY   Vitamin D (Ergocalciferol) 1.25 MG (50000 UNIT) Caps capsule Commonly known as: DRISDOL TAKE 1 CAPSULE BY MOUTH EVERY 7 DAYS  ROS:  {Review of Systems:30496}  Blood pressure 131/72, pulse 60, temperature 98.1 F (36.7 C), temperature source Oral, resp. rate 14, height '6\' 3"'$  (1.905 m), weight 213 lb (96.6 kg), SpO2 97 %. Physical Exam  Results: No results found for this or any previous visit (from the past 48 hour(s)).  No results found.   Assessment & Plan:  Corey Havens. is a 72 y.o. male with *** -*** -*** -Follow up ***  All questions were answered to the satisfaction of the patient and family***.  The risk and benefits of *** were discussed including but not limited to ***.  After careful consideration, Corey Turgeon. has decided to ***.    Virl Cagey 01/28/2022, 11:59 AM

## 2022-01-28 NOTE — Research (Signed)
Message left for [name] to return my call. Encouraged him to call with any questions or if he would like to come in for Core research.

## 2022-01-29 ENCOUNTER — Telehealth: Payer: Self-pay

## 2022-01-29 NOTE — Progress Notes (Signed)
  Chronic Care Management   Note  01/29/2022 Name: Kariem Wolfson. MRN: [id number] DOB: 1950-03-05  [name] is a 71 y.o. year old male who is a primary care patient of Sharion Balloon, FNP. I reached out to [employer]. by phone today in response to a referral sent by Mr. Ladell Pier Jr.'s PCP.  Mr. Auld was given information about Chronic Care Management services today including:  CCM service includes personalized support from designated clinical staff supervised by the physician, including individualized plan of care and coordination with other care providers 24/7 contact phone numbers for assistance for urgent and routine care needs. Service will only be billed when office clinical staff spend 20 minutes or more in a month to coordinate care. Only one practitioner may furnish and bill the service in a calendar month. The patient may stop CCM services at amy time (effective at the end of the month) by phone call to the office staff. The patient will be responsible for cost sharing (co-pay) or up to 20% of the service fee (after annual deductible is met)  Mr. Zyeir Dymek.  agreedto scheduling an appointment with the CCM RN Case Manager   Follow up plan: Patient agreed to scheduled appointment with RN Case Manager on 02/19/2022 and Pharm D 02/27/2022(date/time).   Noreene Larsson, [place], [place] [address] Direct Dial: [phone] [email or web address]

## 2022-02-05 ENCOUNTER — Other Ambulatory Visit: Payer: Self-pay | Admitting: Family

## 2022-02-05 DIAGNOSIS — E559 Vitamin D deficiency, unspecified: Secondary | ICD-10-CM

## 2022-02-07 ENCOUNTER — Other Ambulatory Visit: Payer: Self-pay | Admitting: *Deleted

## 2022-02-07 DIAGNOSIS — E781 Pure hyperglyceridemia: Secondary | ICD-10-CM

## 2022-02-07 NOTE — Progress Notes (Signed)
Seen by patient [name]. on 01/24/2022 12:12 AM

## 2022-02-11 ENCOUNTER — Other Ambulatory Visit: Payer: Self-pay | Admitting: Family

## 2022-02-14 ENCOUNTER — Other Ambulatory Visit: Payer: Self-pay | Admitting: Family

## 2022-02-14 DIAGNOSIS — I2510 Atherosclerotic heart disease of native coronary artery without angina pectoris: Secondary | ICD-10-CM

## 2022-02-14 DIAGNOSIS — E782 Mixed hyperlipidemia: Secondary | ICD-10-CM

## 2022-02-14 DIAGNOSIS — E1165 Type 2 diabetes mellitus with hyperglycemia: Secondary | ICD-10-CM

## 2022-02-14 DIAGNOSIS — I700 Atherosclerosis of aorta: Secondary | ICD-10-CM

## 2022-02-18 ENCOUNTER — Other Ambulatory Visit: Payer: Self-pay | Admitting: Family

## 2022-02-18 ENCOUNTER — Ambulatory Visit (HOSPITAL_BASED_OUTPATIENT_CLINIC_OR_DEPARTMENT_OTHER): Payer: Medicare HMO | Admitting: Internal Medicine

## 2022-02-18 DIAGNOSIS — E782 Mixed hyperlipidemia: Secondary | ICD-10-CM

## 2022-02-19 ENCOUNTER — Telehealth: Payer: Self-pay | Admitting: Family

## 2022-02-19 ENCOUNTER — Telehealth: Payer: Self-pay | Admitting: Pharmacist

## 2022-02-19 ENCOUNTER — Ambulatory Visit (INDEPENDENT_AMBULATORY_CARE_PROVIDER_SITE_OTHER): Payer: Medicare HMO | Admitting: *Deleted

## 2022-02-19 DIAGNOSIS — E1165 Type 2 diabetes mellitus with hyperglycemia: Secondary | ICD-10-CM

## 2022-02-19 DIAGNOSIS — I10 Essential (primary) hypertension: Secondary | ICD-10-CM

## 2022-02-19 NOTE — Chronic Care Management (AMB) (Signed)
Chronic Care Management   CCM RN Visit Note  02/19/2022 Name: [name]. MRN: [id number] DOB: 04-18-1950  Subjective: [name]. is a 72 y.o. year old male who is a primary care patient of Sharion Balloon, FNP. The patient was referred to the Chronic Care Management team for assistance with care management needs subsequent to provider initiation of CCM services and plan of care.    Today's Visit:  Engaged with patient by telephone for initial visit.     SDOH Interventions Today    Flowsheet Row Most Recent Value  SDOH Interventions   Food Insecurity Interventions Intervention Not Indicated  Housing Interventions Intervention Not Indicated  Transportation Interventions Intervention Not Indicated  Utilities Interventions Intervention Not Indicated  Financial Strain Interventions Intervention Not Indicated  Physical Activity Interventions Intervention Not Indicated  [pt plays golf frequently, works around his home and stays busy]  Stress Interventions Intervention Not Indicated  Social Connections Interventions Intervention Not Indicated         Goals Addressed             This Visit's Progress    CCM (DIABETES) EXPECTED OUTCOME:  MONITOR, SELF-MANAGE AND REDUCE SYMPTOMS OF DIABETES       Current Barriers:  Knowledge Deficits related to Diabetes management Chronic Disease Management support and education needs related to Diabetes, diet Patient reports he does not check blood sugar, states he does not think he has been instructed to and does not have a glucometer  Planned Interventions: Provided education to patient about basic DM disease process; Reviewed medications with patient and discussed importance of medication adherence;        Reviewed prescribed diet with patient carbohydrate modified; Counseled on importance of regular laboratory monitoring as prescribed;        Discussed plans with patient for ongoing care management follow up and provided  patient with direct contact information for care management team;      Provided patient with written educational materials related to hypo and hyperglycemia and importance of correct treatment;       Review of patient status, including review of consultants reports, relevant laboratory and other test results, and medications completed;       Screening for signs and symptoms of depression related to chronic disease state;        Assessed social determinant of health barriers;        In basket message sent to primary care provider asking if patient is supposed to be checking CBG  Symptom Management: Take medications as prescribed   Attend all scheduled provider appointments Call pharmacy for medication refills 3-7 days in advance of running out of medications Attend church or other social activities Perform all self care activities independently  Perform IADL's (shopping, preparing meals, housekeeping, managing finances) independently Call provider office for new concerns or questions  check feet daily for cuts, sores or redness trim toenails straight across fill half of plate with vegetables limit fast food meals to no more than 1 per week manage portion size read food labels for fat, fiber, carbohydrates and portion size Look over education sent via my chart- hypoglycemia  Follow Up Plan: Telephone follow up appointment with care management team member scheduled for:  04/16/22 at 9 am       CCM (HYPERLIPIDEMIA) EXPECTED OUTCOME: MONITOR, SELF-MANAGE AND REDUCE SYMPTOMS OF HYPERLIPIDEMIA       Current Barriers:  Knowledge Deficits related to Hyperlipidemia management Chronic Disease Management support and education needs  related to Hyperlipidemia, diet No Advanced Directives in place- requests information be mailed Patient reports he lives alone, has friends, family, neighbors he can call on if needed, reports he is independent in all aspects of his care, stays busy around the house,  plays golf frequently  Planned Interventions: Provider established cholesterol goals reviewed; Counseled on importance of regular laboratory monitoring as prescribed; Provided HLD educational materials; Reviewed importance of limiting foods high in cholesterol; Reviewed exercise goals and target of 150 minutes per week; Screening for signs and symptoms of depression related to chronic disease state;  Assessed social determinant of health barriers;  Advanced directives packet mailed  Symptom Management: Take medications as prescribed   Attend all scheduled provider appointments Call pharmacy for medication refills 3-7 days in advance of running out of medications Attend church or other social activities Perform all self care activities independently  Perform IADL's (shopping, preparing meals, housekeeping, managing finances) independently Call provider office for new concerns or questions  - call doctor with any symptoms you believe are related to your medicine - call doctor when you experience any new symptoms - go to all doctor appointments as scheduled - adhere to prescribed diet: heart healthy - develop an exercise routine Advanced directives packet mailed Look over education via my chart- heart healthy diet  Follow Up Plan: Telephone follow up appointment with care management team member scheduled for:  04/16/22 at 9 am          Plan:Telephone follow up appointment with care management team member scheduled for:  04/16/22 at 9 am  [facility], BSN RN Case Manager [place] [phone]

## 2022-02-19 NOTE — Telephone Encounter (Signed)
PA started -- now states not needed Patient enrolled in [employer] grant for nexlizet copay assistance

## 2022-02-19 NOTE — Patient Instructions (Signed)
Please call the care guide team at [phone] if you need to cancel or reschedule your appointment.   If you are experiencing a Mental Health or [place] or need someone to talk to, please call the Suicide and Crisis Lifeline: 988 call the [place] National Suicide Prevention Lifeline: (971)552-6743 or TTY: 647-690-3291 TTY 551-656-3323) to talk to a trained counselor call 1-800-273-TALK (toll free, 24 hour hotline) go to [facility] [address] [place] [phone]) call the [facility]: [phone] call 911   Following is a copy of the CCM Program Consent:  CCM service includes personalized support from designated clinical staff supervised by the physician, including individualized plan of care and coordination with other care providers 24/7 contact phone numbers for assistance for urgent and routine care needs. Service will only be billed when office clinical staff spend 20 minutes or more in a month to coordinate care. Only one practitioner may furnish and bill the service in a calendar month. The patient may stop CCM services at amy time (effective at the end of the month) by phone call to the office staff. The patient will be responsible for cost sharing (co-pay) or up to 20% of the service fee (after annual deductible is met)  Following is a copy of your full provider care plan:   Goals Addressed             This Visit's Progress    CCM (DIABETES) EXPECTED OUTCOME:  MONITOR, SELF-MANAGE AND REDUCE SYMPTOMS OF DIABETES       Current Barriers:  Knowledge Deficits related to Diabetes management Chronic Disease Management support and education needs related to Diabetes, diet Patient reports he does not check blood sugar, states he does not think he has been instructed to and does not have a glucometer  Planned Interventions: Provided education to patient about basic DM disease process; Reviewed  medications with patient and discussed importance of medication adherence;        Reviewed prescribed diet with patient carbohydrate modified; Counseled on importance of regular laboratory monitoring as prescribed;        Discussed plans with patient for ongoing care management follow up and provided patient with direct contact information for care management team;      Provided patient with written educational materials related to hypo and hyperglycemia and importance of correct treatment;       Review of patient status, including review of consultants reports, relevant laboratory and other test results, and medications completed;       Screening for signs and symptoms of depression related to chronic disease state;        Assessed social determinant of health barriers;        In basket message sent to primary care provider asking if patient is supposed to be checking CBG  Symptom Management: Take medications as prescribed   Attend all scheduled provider appointments Call pharmacy for medication refills 3-7 days in advance of running out of medications Attend church or other social activities Perform all self care activities independently  Perform IADL's (shopping, preparing meals, housekeeping, managing finances) independently Call provider office for new concerns or questions  check feet daily for cuts, sores or redness trim toenails straight across fill half of plate with vegetables limit fast food meals to no more than 1 per week manage portion size read food labels for fat, fiber, carbohydrates and portion size Look over education sent via my chart- hypoglycemia  Follow Up Plan:  Telephone follow up appointment with care management team member scheduled for:  04/16/22 at 9 am       CCM (HYPERLIPIDEMIA) EXPECTED OUTCOME: MONITOR, SELF-MANAGE AND REDUCE SYMPTOMS OF HYPERLIPIDEMIA       Current Barriers:  Knowledge Deficits related to Hyperlipidemia management Chronic Disease  Management support and education needs related to Hyperlipidemia, diet No Advanced Directives in place- requests information be mailed Patient reports he lives alone, has friends, family, neighbors he can call on if needed, reports he is independent in all aspects of his care, stays busy around the house, plays golf frequently  Planned Interventions: Provider established cholesterol goals reviewed; Counseled on importance of regular laboratory monitoring as prescribed; Provided HLD educational materials; Reviewed importance of limiting foods high in cholesterol; Reviewed exercise goals and target of 150 minutes per week; Screening for signs and symptoms of depression related to chronic disease state;  Assessed social determinant of health barriers;  Advanced directives packet mailed  Symptom Management: Take medications as prescribed   Attend all scheduled provider appointments Call pharmacy for medication refills 3-7 days in advance of running out of medications Attend church or other social activities Perform all self care activities independently  Perform IADL's (shopping, preparing meals, housekeeping, managing finances) independently Call provider office for new concerns or questions  - call doctor with any symptoms you believe are related to your medicine - call doctor when you experience any new symptoms - go to all doctor appointments as scheduled - adhere to prescribed diet: heart healthy - develop an exercise routine Advanced directives packet mailed Look over education via my chart- heart healthy diet  Follow Up Plan: Telephone follow up appointment with care management team member scheduled for:  04/16/22 at 9 am          Patient verbalizes understanding of instructions and care plan provided today and agrees to view in [place]. Active MyChart status and patient understanding of how to access instructions and care plan via MyChart confirmed with patient.     Telephone  follow up appointment with care management team member scheduled for:  04/16/22 at 9 am  Hypoglycemia Hypoglycemia is when the sugar (glucose) level in your blood is too low. Low blood sugar can happen to people who have diabetes and people who do not have diabetes. Low blood sugar can happen quickly, and it can be an emergency. What are the causes? This condition happens most often in people who have diabetes. It may be caused by: Diabetes medicine. Not eating enough, or not eating often enough. Doing more physical activity. Drinking alcohol on an empty stomach. If you do not have diabetes, this condition may be caused by: A tumor in the pancreas. Not eating enough, or not eating for long periods at a time (fasting). A very bad infection or illness. Problems after having weight loss (bariatric) surgery. Kidney failure or liver failure. Certain medicines. What increases the risk? This condition is more likely to develop in people who: Have diabetes and take medicines to lower their blood sugar. Abuse alcohol. Have a very bad illness. What are the signs or symptoms? Mild Hunger. Sweating and feeling clammy. Feeling dizzy or light-headed. Being sleepy or having trouble sleeping. Feeling like you may vomit (nauseous). A fast heartbeat. A headache. Blurry vision. Mood changes, such as: Being grouchy. Feeling worried or nervous (anxious). Tingling or loss of feeling (numbness) around your mouth, lips, or tongue. Moderate Confusion and poor judgment. Behavior changes. Weakness. Uneven heartbeat. Trouble with moving (coordination).  Very low Very low blood sugar (severe hypoglycemia) is a medical emergency. It can cause: Fainting. Seizures. Loss of consciousness (coma). Death. How is this treated? Treating low blood sugar Low blood sugar is often treated by eating or drinking something that has sugar in it right away. The food or drink should contain 15 grams of a fast-acting  carb (carbohydrate). Options include: 4 oz (120 mL) of fruit juice. 4 oz (120 mL) of regular soda (not diet soda). A few pieces of hard candy. Check food labels to see how many pieces to eat for 15 grams. 1 Tbsp (15 mL) of sugar or honey. 4 glucose tablets. 1 tube of glucose gel. Treating low blood sugar if you have diabetes If you can think clearly and swallow safely, follow the 15:15 rule: Take 15 grams of a fast-acting carb. Talk with your doctor about how much you should take. Always keep a source of fast-acting carb with you, such as: Glucose tablets (take 4 tablets). A few pieces of hard candy. Check food labels to see how many pieces to eat for 15 grams. 4 oz (120 mL) of fruit juice. 4 oz (120 mL) of regular soda (not diet soda). 1 Tbsp (15 mL) of honey or sugar. 1 tube of glucose gel. Check your blood sugar 15 minutes after you take the carb. If your blood sugar is still at or below 70 mg/dL (3.9 mmol/L), take 15 grams of a carb again. If your blood sugar does not go above 70 mg/dL (3.9 mmol/L) after 3 tries, get help right away. After your blood sugar goes back to normal, eat a meal or a snack within 1 hour.  Treating very low blood sugar If your blood sugar is below 54 mg/dL (3 mmol/L), you have very low blood sugar, or severe hypoglycemia. This is an emergency. Get medical help right away. If you have very low blood sugar and you cannot eat or drink, you will need to be given a hormone called glucagon. A family member or friend should learn how to check your blood sugar and how to give you glucagon. Ask your doctor if you need to have an emergency glucagon kit at home. Very low blood sugar may also need to be treated in a hospital. Follow these instructions at home: General instructions Take over-the-counter and prescription medicines only as told by your doctor. Stay aware of your blood sugar as told by your doctor. If you drink alcohol: Limit how much you have to: 0-1  drink a day for women who are not pregnant. 0-2 drinks a day for men. Know how much alcohol is in your drink. In the [place] one drink equals one 12 oz bottle of beer (355 mL), one 5 oz glass of wine (148 mL), or one 1 oz glass of hard liquor (44 mL). Be sure to eat food when you drink alcohol. Know that your body absorbs alcohol quickly. This may lead to low blood sugar later. Be sure to keep checking your blood sugar. Keep all follow-up visits. If you have diabetes:  Always have a fast-acting carb (15 grams) with you to treat low blood sugar. Follow your diabetes care plan as told by your doctor. Make sure you: Know the symptoms of low blood sugar. Check your blood sugar as often as told. Always check it before and after exercise. Always check your blood sugar before you drive. Take your medicines as told. Follow your meal plan. Eat on time. Do not skip meals. Share your  diabetes care plan with: Your work or school. People you live with. Carry a card or wear jewelry that says you have diabetes. Where to find more information American Diabetes Association: www.diabetes.org Contact a doctor if: You have trouble keeping your blood sugar in your target range. You have low blood sugar often. Get help right away if: You still have symptoms after you eat or drink something that contains 15 grams of fast-acting carb, and you cannot get your blood sugar above 70 mg/dL by following the 15:15 rule. Your blood sugar is below 54 mg/dL (3 mmol/L). You have a seizure. You faint. These symptoms may be an emergency. Get help right away. Call your local emergency services (911 in the [place] Do not wait to see if the symptoms will go away. Do not drive yourself to the hospital. Summary Hypoglycemia happens when the level of sugar (glucose) in your blood is too low. Low blood sugar can happen to people who have diabetes and people who do not have diabetes. Low blood sugar can happen quickly, and it  can be an emergency. Make sure you know the symptoms of low blood sugar and know how to treat it. Always keep a source of sugar (fast-acting carb) with you to treat low blood sugar. This information is not intended to replace advice given to you by your health care provider. Make sure you discuss any questions you have with your health care provider. Document Revised: 12/29/2019 Document Reviewed: 12/29/2019 Elsevier Patient Education  [place] Eating a healthy diet is important for the health of your heart. A heart-healthy eating plan includes: Eating less unhealthy fats. Eating more healthy fats. Eating less salt in your food. Salt is also called sodium. Making other changes in your diet. Talk with your doctor or a diet specialist (dietitian) to create an eating plan that is right for you. What is my plan? Your doctor may recommend an eating plan that includes: Total fat: ______% or less of total calories a day. Saturated fat: ______% or less of total calories a day. Cholesterol: less than _________mg a day. Sodium: less than _________mg a day. What are tips for following this plan? Cooking Avoid frying your food. Try to bake, boil, grill, or broil it instead. You can also reduce fat by: Removing the skin from poultry. Removing all visible fats from meats. Steaming vegetables in water or broth. Meal planning  At meals, divide your plate into four equal parts: Fill one-half of your plate with vegetables and green salads. Fill one-fourth of your plate with whole grains. Fill one-fourth of your plate with lean protein foods. Eat 2-4 cups of vegetables per day. One cup of vegetables is: 1 cup (91 g) broccoli or cauliflower florets. 2 medium carrots. 1 large bell pepper. 1 large sweet potato. 1 large tomato. 1 medium white potato. 2 cups (150 g) raw leafy greens. Eat 1-2 cups of fruit per day. One cup of fruit is: 1 small apple 1 large  banana 1 cup (237 g) mixed fruit, 1 large orange,  cup (82 g) dried fruit, 1 cup (240 mL) 100% fruit juice. Eat more foods that have soluble fiber. These are apples, broccoli, carrots, beans, peas, and barley. Try to get 20-30 g of fiber per day. Eat 4-5 servings of nuts, legumes, and seeds per week: 1 serving of dried beans or legumes equals  cup (90 g) cooked. 1 serving of nuts is  oz (12 almonds, 24 pistachios, or 7 walnut  halves). 1 serving of seeds equals  oz (8 g). General information Eat more home-cooked food. Eat less restaurant, buffet, and fast food. Limit or avoid alcohol. Limit foods that are high in starch and sugar. Avoid fried foods. Lose weight if you are overweight. Keep track of how much salt (sodium) you eat. This is important if you have high blood pressure. Ask your doctor to tell you more about this. Try to add vegetarian meals each week. Fats Choose healthy fats. These include olive oil and canola oil, flaxseeds, walnuts, almonds, and seeds. Eat more omega-3 fats. These include salmon, mackerel, sardines, tuna, flaxseed oil, and ground flaxseeds. Try to eat fish at least 2 times each week. Check food labels. Avoid foods with trans fats or high amounts of saturated fat. Limit saturated fats. These are often found in animal products, such as meats, butter, and cream. These are also found in plant foods, such as palm oil, palm kernel oil, and coconut oil. Avoid foods with partially hydrogenated oils in them. These have trans fats. Examples are stick margarine, some tub margarines, cookies, crackers, and other baked goods. What foods should I eat? Fruits All fresh, canned (in natural juice), or frozen fruits. Vegetables Fresh or frozen vegetables (raw, steamed, roasted, or grilled). Green salads. Grains Most grains. Choose whole wheat and whole grains most of the time. Rice and pasta, including brown rice and pastas made with whole wheat. Meats and other  proteins Lean, well-trimmed beef, veal, pork, and lamb. Chicken and [place] without skin. All fish and shellfish. Wild duck, rabbit, pheasant, and venison. Egg whites or low-cholesterol egg substitutes. Dried beans, peas, lentils, and tofu. Seeds and most nuts. Dairy Low-fat or nonfat cheeses, including ricotta and mozzarella. Skim or 1% milk that is liquid, powdered, or evaporated. Buttermilk that is made with low-fat milk. Nonfat or low-fat yogurt. Fats and oils Non-hydrogenated (trans-free) margarines. Vegetable oils, including soybean, sesame, sunflower, olive, peanut, safflower, corn, canola, and cottonseed. Salad dressings or mayonnaise made with a vegetable oil. Beverages Mineral water. Coffee and tea. Diet carbonated beverages. Sweets and desserts Sherbet, gelatin, and fruit ice. Small amounts of dark chocolate. Limit all sweets and desserts. Seasonings and condiments All seasonings and condiments. The items listed above may not be a complete list of foods and drinks you can eat. Contact a dietitian for more options. What foods should I avoid? Fruits Canned fruit in heavy syrup. Fruit in cream or butter sauce. Fried fruit. Limit coconut. Vegetables Vegetables cooked in cheese, cream, or butter sauce. Fried vegetables. Grains Breads that are made with saturated or trans fats, oils, or whole milk. Croissants. Sweet rolls. Donuts. High-fat crackers, such as cheese crackers. Meats and other proteins Fatty meats, such as hot dogs, ribs, sausage, bacon, rib-eye roast or steak. High-fat deli meats, such as salami and bologna. Caviar. Domestic duck and goose. Organ meats, such as liver. Dairy Cream, sour cream, cream cheese, and creamed cottage cheese. Whole-milk cheeses. Whole or 2% milk that is liquid, evaporated, or condensed. Whole buttermilk. Cream sauce or high-fat cheese sauce. Yogurt that is made from whole milk. Fats and oils Meat fat, or shortening. Cocoa butter, hydrogenated oils,  palm oil, coconut oil, palm kernel oil. Solid fats and shortenings, including bacon fat, salt pork, lard, and butter. Nondairy cream substitutes. Salad dressings with cheese or sour cream. Beverages Regular sodas and juice drinks with added sugar. Sweets and desserts Frosting. Pudding. Cookies. Cakes. Pies. Milk chocolate or white chocolate. Buttered syrups. Full-fat ice cream or ice  cream drinks. The items listed above may not be a complete list of foods and drinks to avoid. Contact a dietitian for more information. Summary Heart-healthy meal planning includes eating less unhealthy fats, eating more healthy fats, and making other changes in your diet. Eat a balanced diet. This includes fruits and vegetables, low-fat or nonfat dairy, lean protein, nuts and legumes, whole grains, and heart-healthy oils and fats. This information is not intended to replace advice given to you by your health care provider. Make sure you discuss any questions you have with your health care provider. Document Revised: 03/04/2021 Document Reviewed: 03/04/2021 Elsevier Patient Education  [place].

## 2022-02-19 NOTE — Telephone Encounter (Signed)
Pt advised and will come by and pick up grant paperwork and rebate card.

## 2022-02-19 NOTE — Plan of Care (Signed)
Chronic Care Management Provider Comprehensive Care Plan    02/19/2022 Name: [name]. MRN: [id number] DOB: [birth year]  Referral to Chronic Care Management (CCM) services was placed by Provider:  Evelina Dun FNP on Date: 02/14/2022.  Chronic Condition 1: HYPERLIPIDEMIA Provider Assessment and Plan  Mixed hyperlipidemia - CMP14+EGFR - CBC with Differential/Platelet   Expected Outcome/Goals Addressed This Visit (Provider CCM goals/Provider Assessment and plan  CCM (HYPERLIPIDEMIA) EXPECTED OUTCOME: MONITOR, SELF-MANAGE AND REDUCE SYMPTOMS OF HYPERLIPIDEMIA  Symptom Management Condition 1: Take medications as prescribed   Attend all scheduled provider appointments Call pharmacy for medication refills 3-7 days in advance of running out of medications Attend church or other social activities Perform all self care activities independently  Perform IADL's (shopping, preparing meals, housekeeping, managing finances) independently Call provider office for new concerns or questions  - call doctor with any symptoms you believe are related to your medicine - call doctor when you experience any new symptoms - go to all doctor appointments as scheduled - adhere to prescribed diet: heart healthy - develop an exercise routine Advanced directives packet mailed Look over education via my chart- heart healthy diet  Chronic Condition 2: DIABETES Provider Assessment and Plan Type 2 diabetes mellitus with hyperglycemia, without long-term current use of insulin (HCC) - CMP14+EGFR - CBC with Differential/Platelet - Bayer DCA Hb A1c Waived - Microalbumin / creatinine urine ratio   Expected Outcome/Goals Addressed This Visit (Provider CCM goals/Provider Assessment and plan  CCM (DIABETES) EXPECTED OUTCOME:  MONITOR, SELF-MANAGE AND REDUCE SYMPTOMS OF DIABETES  Symptom Management Condition 2: Take medications as prescribed   Attend all scheduled provider appointments Call pharmacy for  medication refills 3-7 days in advance of running out of medications Attend church or other social activities Perform all self care activities independently  Perform IADL's (shopping, preparing meals, housekeeping, managing finances) independently Call provider office for new concerns or questions  check feet daily for cuts, sores or redness trim toenails straight across fill half of plate with vegetables limit fast food meals to no more than 1 per week manage portion size read food labels for fat, fiber, carbohydrates and portion size Look over education sent via my chart- hypoglycemia  Problem List Patient Active Problem List   Diagnosis Date Noted   Myalgia due to statin 09/17/2021   Type 2 diabetes mellitus with hyperglycemia ([place]) 54/00/8676   Umbilical hernia without obstruction and without gangrene 06/14/2020   Vitamin D deficiency 09/15/2018   Trigger middle finger of right hand 09/15/2018   Obstructive sleep apnea 10/14/2017   Aortic atherosclerosis ([place]) 08/05/2016   Diverticulosis of colon (without mention of hemorrhage)    Mixed hyperlipidemia    Benign neoplasm of colon    Hypothyroidism 06/12/2007   Coronary artery disease 06/12/2007   Coronary artery disease involving native coronary artery of native heart with angina pectoris with documented spasm ([place]) 06/12/2007   GERD 06/12/2007   Diverticulosis of colon 06/12/2007   GUAIAC POSITIVE STOOL 06/12/2007    Medication Management  Current Outpatient Medications:    aspirin 81 MG EC tablet, Take 81 mg by mouth daily., Disp: , Rfl:    Bempedoic Acid-Ezetimibe (NEXLIZET) 180-10 MG TABS, TAKE ONE (1) TABLET BY MOUTH EVERY DAY, Disp: 30 tablet, Rfl: 4   fenofibrate (TRICOR) 145 MG tablet, TAKE ONE (1) TABLET BY MOUTH EVERY DAY, Disp: 30 tablet, Rfl: 5   fluticasone (FLONASE) 50 MCG/ACT nasal spray, Place 2 sprays into both nostrils daily., Disp: 16 g, Rfl: 6  levothyroxine (SYNTHROID) 112 MCG tablet, TAKE ONE (1)  TABLET BY MOUTH EVERY DAY, Disp: 30 tablet, Rfl: 8   metFORMIN (GLUCOPHAGE) 500 MG tablet, Take 1 tablet (500 mg total) by mouth 2 (two) times daily with a meal., Disp: 180 tablet, Rfl: 1   omeprazole (PRILOSEC) 20 MG capsule, Take 2 caps po daily, Disp: 180 capsule, Rfl: 3   ranolazine (RANEXA) 500 MG 12 hr tablet, TAKE ONE (1) TABLET BY MOUTH TWO (2) TIMES DAILY, Disp: 60 tablet, Rfl: 5   Vitamin D, Ergocalciferol, (DRISDOL) 1.25 MG (50000 UNIT) CAPS capsule, TAKE 1 CAPSULE BY MOUTH EVERY 7 DAYS, Disp: 12 capsule, Rfl: 1  Cognitive Assessment Identity Confirmed: : Name; DOB Cognitive Status: Normal   Functional Assessment Hearing Difficulty or Deaf: no Wear Glasses or Blind: no Concentrating, Remembering or Making Decisions Difficulty (CP): no Difficulty Communicating: no Difficulty Eating/Swallowing: no Walking or Climbing Stairs Difficulty: no Dressing/Bathing Difficulty: no Doing Errands Independently Difficulty (such as shopping) (CP): no   Caregiver Assessment  Primary Source of Support/Comfort: child(ren); nonrelative caregiver Name of Support/Comfort Primary Source: pt states no one person, friends, family, neighbors People in Home: alone   Planned Interventions  Provided education to patient about basic DM disease process; Reviewed medications with patient and discussed importance of medication adherence;        Reviewed prescribed diet with patient carbohydrate modified; Counseled on importance of regular laboratory monitoring as prescribed;        Discussed plans with patient for ongoing care management follow up and provided patient with direct contact information for care management team;      Provided patient with written educational materials related to hypo and hyperglycemia and importance of correct treatment;       Review of patient status, including review of consultants reports, relevant laboratory and other test results, and medications completed;        Screening for signs and symptoms of depression related to chronic disease state;        Assessed social determinant of health barriers;        In basket message sent to primary care provider asking if patient is supposed to be checking CBG Provider established cholesterol goals reviewed; Counseled on importance of regular laboratory monitoring as prescribed; Provided HLD educational materials; Reviewed importance of limiting foods high in cholesterol; Reviewed exercise goals and target of 150 minutes per week; Screening for signs and symptoms of depression related to chronic disease state;  Assessed social determinant of health barriers;  Advanced directives packet mailed  Interaction and coordination with outside resources, practitioners, and providers See CCM Referral  Care Plan: Available in MyChart

## 2022-02-19 NOTE — Telephone Encounter (Signed)
Please advise if patient needs appt

## 2022-02-27 ENCOUNTER — Ambulatory Visit: Payer: Medicare HMO | Admitting: Pharmacist

## 2022-02-27 VITALS — BP 126/80 | HR 75

## 2022-02-27 DIAGNOSIS — G720 Drug-induced myopathy: Secondary | ICD-10-CM

## 2022-02-27 DIAGNOSIS — E782 Mixed hyperlipidemia: Secondary | ICD-10-CM

## 2022-02-27 DIAGNOSIS — I25111 Atherosclerotic heart disease of native coronary artery with angina pectoris with documented spasm: Secondary | ICD-10-CM

## 2022-02-27 MED ORDER — ICOSAPENT ETHYL 1 G PO CAPS: 2.0000 g | ORAL_CAPSULE | Freq: Two times a day (BID) | ORAL | 5 refills | Status: DC

## 2022-02-27 NOTE — Progress Notes (Signed)
Chronic Care Management Pharmacy Note  02/27/2022 Name:  [name]. MRN:  [id number] DOB:  08-26-1950  Summary:  Hypertension:  Goal on Track (progressing): YES. Uncontrolled; current treatment: not on medication, will discuss case with PCP, HLD was the primary goal of this visit;  Current home readings: 150s/80s Denies hypotensive/hypertensive symptoms Collaborated with pcp--will discuss changes at f/u; start ACEi Hyperlipidemia:  Goal on Track (progressing): YES. Uncontrolled; current treatment: Nexlizet, fenofibrate for triglycerides; adding vascepa Tolerating nexlizet well Renewed [employer] grant for assistance- (for nexlizet & vascepa) Medications previously tried: statins (atorvastatin)  Current dietary patterns: recommended heart health/Mediterranean  Recommended continue nexlizet; repeat lipid panel in 3-6 months Assessed patient finances. 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 Lipid Panel     Component Value Date/Time   CHOL 384 (H) 01/20/2022 1144   CHOL 202 (H) 10/15/2012 0854   TRIG 1,692 (HH) 01/20/2022 1144   TRIG 590 (HH) 06/08/2014 1703   TRIG 241 (H) 10/15/2012 0854   HDL 7 (L) 01/20/2022 1144   HDL 43 06/08/2014 1703   HDL 39 (L) 10/15/2012 0854   CHOLHDL 54.9 (H) 01/20/2022 1144   CHOLHDL 5.9 CALC 01/18/2007 0846   VLDL 55 (H) 01/18/2007 0846   LDLCALC Comment (A) 01/20/2022 1144   LDLCALC 115 (H) 10/15/2012 0854   LDLDIRECT 72 05/13/2016 1100   LDLDIRECT 124.9 01/18/2007 0846   LABVLDL Comment (A) 01/20/2022 1144   Diabetes: -on track with metformin -A1c increasing -Recommend continue meds and diet/lifestyle  Patient Goals/Self-Care Activities patient will:  - take medications as prescribed as evidenced by patient report and record review collaborate with provider on medication access solutions target a minimum of 150 minutes of moderate intensity exercise weekly engage in  dietary modifications by heart healthy diet/mediterranean diet   Subjective: [name]. is an 72 y.o. year old male who is a primary patient of Sharion Balloon, FNP.  The patient was referred to the Chronic Care Management team for assistance with care management needs subsequent to provider initiation of CCM services and plan of care.    Engaged with patient by telephone for follow up visit in response to provider referral for CCM services.   Objective:  LABS:    Lab Results  Component Value Date   CREATININE 1.14 01/20/2022   CREATININE 1.22 09/17/2021   CREATININE 1.24 12/24/2020     Lab Results  Component Value Date   HGBA1C 6.9 (H) 01/20/2022         Component Value Date/Time   CHOL 384 (H) 01/20/2022 1144   CHOL 202 (H) 10/15/2012 0854   TRIG 1,692 (HH) 01/20/2022 1144   TRIG 590 (HH) 06/08/2014 1703   TRIG 241 (H) 10/15/2012 0854   HDL 7 (L) 01/20/2022 1144   HDL 43 06/08/2014 1703   HDL 39 (L) 10/15/2012 0854   CHOLHDL 54.9 (H) 01/20/2022 1144   CHOLHDL 5.9 CALC 01/18/2007 0846   VLDL 55 (H) 01/18/2007 0846   LDLCALC Comment (A) 01/20/2022 1144   LDLCALC 115 (H) 10/15/2012 0854   LDLDIRECT 72 05/13/2016 1100   LDLDIRECT 124.9 01/18/2007 0846     Clinical ASCVD: No   The ASCVD Risk score (Arnett DK, et al., 2019) failed to calculate for the following reasons:   The valid HDL cholesterol range is 20 to 100 mg/dL   The valid total cholesterol range is 130 to 320 mg/dL    Other: (CHADS2VASc if Afib, PHQ9 if depression,  MMRC or CAT for COPD, ACT, DEXA)    BP Readings from Last 3 Encounters:  02/28/22 126/80  01/28/22 131/72  01/20/22 130/77      SDOH:  (Social Determinants of Health) assessments and interventions performed:    Allergies  Allergen Reactions   Imdur [Isosorbide Nitrate]     Headache    Lipitor [Atorvastatin] Other (See Comments)    myalgias    Medications Reviewed Today     Reviewed by Lavera Guise, [facility] (Pharmacist)  on 02/27/22 at [place] List Status: <None>   Medication Order Taking? Sig Documenting Provider Last Dose Status Informant  aspirin 81 MG EC tablet [id number] No Take 81 mg by mouth daily. [provider] Taking Active   Bempedoic Acid-Ezetimibe (NEXLIZET) 180-10 MG TABS [id number] No TAKE ONE (1) TABLET BY MOUTH EVERY DAY Sharion Balloon, FNP Taking Active   fenofibrate (TRICOR) 145 MG tablet [id number] No TAKE ONE (1) TABLET BY MOUTH EVERY DAY Hawks, Christy A, FNP Taking Active   fluticasone (FLONASE) 50 MCG/ACT nasal spray [id number] No Place 2 sprays into both nostrils daily. Hassell Done Mary-Margaret, FNP Taking Active   icosapent Ethyl (VASCEPA) 1 g capsule [id number]  Take 2 capsules (2 g total) by mouth 2 (two) times daily. Sharion Balloon, FNP  Active   levothyroxine (SYNTHROID) 112 MCG tablet [id number] No TAKE ONE (1) TABLET BY MOUTH EVERY DAY Hawks, Christy A, FNP Taking Active   metFORMIN (GLUCOPHAGE) 500 MG tablet [id number] No Take 1 tablet (500 mg total) by mouth 2 (two) times daily with a meal. Sharion Balloon, FNP Taking Active   omeprazole (PRILOSEC) 20 MG capsule [id number] No Take 2 caps po daily Hawks, Christy A, FNP Taking Active   ranolazine (RANEXA) 500 MG 12 hr tablet [id number] No TAKE ONE (1) TABLET BY MOUTH TWO (2) TIMES DAILY Hawks, Christy A, FNP Taking Active   Vitamin D, Ergocalciferol, (DRISDOL) 1.25 MG (50000 UNIT) CAPS capsule [id number] No TAKE 1 CAPSULE BY MOUTH EVERY 7 DAYS Hawks, Christy A, FNP Taking Active               Goals Addressed               This Visit's Progress     Patient Stated     HYPERLIPIDEMIA, hypertension PHARMD GOAL (pt-stated)        Current Barriers:  Unable to independently afford treatment regimen Unable to achieve control of hyperlipidemia, hypertension  Suboptimal therapeutic regimen for hyperlipidemia, hypertension  Pharmacist Clinical Goal(s):  patient will verbalize ability to afford treatment regimen achieve  control of HLD, HTN as evidenced by GOALS MET-LDL<70, BP <130/80 adhere to plan to optimize therapeutic regimen for HLD, HTN as evidenced by report of adherence to recommended medication management changes through collaboration with PharmD and provider.   Interventions: 1:1 collaboration with Sharion Balloon, FNP regarding development and update of comprehensive plan of care as evidenced by provider attestation and co-signature Inter-disciplinary care team collaboration (see longitudinal plan of care) Comprehensive medication review performed; medication list updated in electronic medical record  Hypertension:  Goal on Track (progressing): YES. Uncontrolled; current treatment: not on medication, will discuss case with PCP, HLD was the primary goal of this visit;  Current home readings: 150s/80s Denies hypotensive/hypertensive symptoms Collaborated with pcp--will discuss changes at f/u; start ACEi Hyperlipidemia:  Goal on Track (progressing): YES. Uncontrolled; current treatment: Nexlizet, fenofibrate for triglycerides; adding vascepa Tolerating nexlizet well Renewed [employer] grant for assistance- (for nexlizet &  vascepa) Medications previously tried: statins (atorvastatin)  Current dietary patterns: recommended heart health/Mediterranean  Recommended continue nexlizet; repeat lipid panel in 3-6 months Assessed patient finances. 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 Lipid Panel     Component Value Date/Time   CHOL 384 (H) 01/20/2022 1144   CHOL 202 (H) 10/15/2012 0854   TRIG 1,692 (HH) 01/20/2022 1144   TRIG 590 (HH) 06/08/2014 1703   TRIG 241 (H) 10/15/2012 0854   HDL 7 (L) 01/20/2022 1144   HDL 43 06/08/2014 1703   HDL 39 (L) 10/15/2012 0854   CHOLHDL 54.9 (H) 01/20/2022 1144   CHOLHDL 5.9 CALC 01/18/2007 0846   VLDL 55 (H) 01/18/2007 0846   LDLCALC Comment (A) 01/20/2022 1144   LDLCALC 115 (H) 10/15/2012 0854    LDLDIRECT 72 05/13/2016 1100   LDLDIRECT 124.9 01/18/2007 0846   LABVLDL Comment (A) 01/20/2022 1144  Diabetes: -on track with metformin -A1c increasing -Recommend continue meds and diet/lifestyle  Patient Goals/Self-Care Activities patient will:  - take medications as prescribed as evidenced by patient report and record review collaborate with provider on medication access solutions target a minimum of 150 minutes of moderate intensity exercise weekly engage in dietary modifications by heart healthy diet/mediterranean diet         Plan: Telephone follow up appointment with care management team member scheduled for:  3 months     Regina Eck, PharmD, BCPS, BCACP Clinical Pharmacist, [place]  II  T [phone]

## 2022-02-28 NOTE — Patient Instructions (Signed)
Visit Information  Following are the goals we discussed today:  Current Barriers:  Unable to independently afford treatment regimen Unable to achieve control of hyperlipidemia, hypertension  Suboptimal therapeutic regimen for hyperlipidemia, hypertension  Pharmacist Clinical Goal(s):  patient will verbalize ability to afford treatment regimen achieve control of HLD, HTN as evidenced by GOALS MET-LDL<70, BP <130/80 adhere to plan to optimize therapeutic regimen for HLD, HTN as evidenced by report of adherence to recommended medication management changes through collaboration with PharmD and provider.   Interventions: 1:1 collaboration with Sharion Balloon, FNP regarding development and update of comprehensive plan of care as evidenced by provider attestation and co-signature Inter-disciplinary care team collaboration (see longitudinal plan of care) Comprehensive medication review performed; medication list updated in electronic medical record  Hypertension:  Goal on Track (progressing): YES. Uncontrolled; current treatment: not on medication, will discuss case with PCP, HLD was the primary goal of this visit;  Current home readings: 150s/80s Denies hypotensive/hypertensive symptoms Collaborated with pcp--will discuss changes at f/u; start ACEi Hyperlipidemia:  Goal on Track (progressing): YES. Uncontrolled; current treatment: Nexlizet, fenofibrate for triglycerides; adding vascepa Tolerating nexlizet well Renewed [employer] grant for assistance- (for nexlizet & vascepa) Medications previously tried: statins (atorvastatin)  Current dietary patterns: recommended heart health/Mediterranean  Recommended continue nexlizet; repeat lipid panel in 3-6 months Assessed patient finances. 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 Lipid Panel     Component Value Date/Time   CHOL 384 (H) 01/20/2022 1144   CHOL 202 (H) 10/15/2012 0854    TRIG 1,692 (HH) 01/20/2022 1144   TRIG 590 (HH) 06/08/2014 1703   TRIG 241 (H) 10/15/2012 0854   HDL 7 (L) 01/20/2022 1144   HDL 43 06/08/2014 1703   HDL 39 (L) 10/15/2012 0854   CHOLHDL 54.9 (H) 01/20/2022 1144   CHOLHDL 5.9 CALC 01/18/2007 0846   VLDL 55 (H) 01/18/2007 0846   LDLCALC Comment (A) 01/20/2022 1144   LDLCALC 115 (H) 10/15/2012 0854   LDLDIRECT 72 05/13/2016 1100   LDLDIRECT 124.9 01/18/2007 0846   LABVLDL Comment (A) 01/20/2022 1144   Diabetes: -on track with metformin -A1c increasing -Recommend continue meds and diet/lifestyle  Patient Goals/Self-Care Activities patient will:  - take medications as prescribed as evidenced by patient report and record review collaborate with provider on medication access solutions target a minimum of 150 minutes of moderate intensity exercise weekly engage in dietary modifications by heart healthy diet/mediterranean diet   Plan: Telephone follow up appointment with care management team member scheduled for:  3 months  Signature Regina Eck, PharmD, BCPS, BCACP Clinical Pharmacist, [place]  II  T [phone]   Please call the care guide team at [phone] if you need to cancel or reschedule your appointment.   The patient verbalized understanding of instructions, educational materials, and care plan provided today and DECLINED offer to receive copy of patient instructions, educational materials, and care plan.

## 2022-03-12 DIAGNOSIS — E1169 Type 2 diabetes mellitus with other specified complication: Secondary | ICD-10-CM

## 2022-03-12 DIAGNOSIS — E785 Hyperlipidemia, unspecified: Secondary | ICD-10-CM

## 2022-03-18 DIAGNOSIS — H35373 Puckering of macula, bilateral: Secondary | ICD-10-CM | POA: Diagnosis not present

## 2022-03-18 DIAGNOSIS — H40013 Open angle with borderline findings, low risk, bilateral: Secondary | ICD-10-CM | POA: Diagnosis not present

## 2022-03-18 DIAGNOSIS — H5211 Myopia, right eye: Secondary | ICD-10-CM | POA: Diagnosis not present

## 2022-03-18 DIAGNOSIS — H04123 Dry eye syndrome of bilateral lacrimal glands: Secondary | ICD-10-CM | POA: Diagnosis not present

## 2022-03-18 DIAGNOSIS — H26493 Other secondary cataract, bilateral: Secondary | ICD-10-CM | POA: Diagnosis not present

## 2022-04-16 ENCOUNTER — Ambulatory Visit (INDEPENDENT_AMBULATORY_CARE_PROVIDER_SITE_OTHER): Payer: Medicare HMO | Admitting: *Deleted

## 2022-04-16 DIAGNOSIS — E782 Mixed hyperlipidemia: Secondary | ICD-10-CM

## 2022-04-16 DIAGNOSIS — E1165 Type 2 diabetes mellitus with hyperglycemia: Secondary | ICD-10-CM

## 2022-04-16 NOTE — Patient Instructions (Signed)
Please call the care guide team at [phone] if you need to cancel or reschedule your appointment.   If you are experiencing a Mental Health or [place] or need someone to talk to, please call the Suicide and Crisis Lifeline: 988 call the [place] National Suicide Prevention Lifeline: 610-692-8760 or TTY: 812 536 3472 TTY 651-042-8789) to talk to a trained counselor call 1-800-273-TALK (toll free, 24 hour hotline) go to [facility] [address], [place] [phone]) call the [facility]: [phone] call 911   Following is a copy of the CCM Program Consent:  CCM service includes personalized support from designated clinical staff supervised by the physician, including individualized plan of care and coordination with other care providers 24/7 contact phone numbers for assistance for urgent and routine care needs. Service will only be billed when office clinical staff spend 20 minutes or more in a month to coordinate care. Only one practitioner may furnish and bill the service in a calendar month. The patient may stop CCM services at amy time (effective at the end of the month) by phone call to the office staff. The patient will be responsible for cost sharing (co-pay) or up to 20% of the service fee (after annual deductible is met)  Following is a copy of your full provider care plan:   Goals Addressed             This Visit's Progress    CCM (DIABETES) EXPECTED OUTCOME:  MONITOR, SELF-MANAGE AND REDUCE SYMPTOMS OF DIABETES       Current Barriers:  Knowledge Deficits related to Diabetes management Chronic Disease Management support and education needs related to Diabetes, diet Patient reports he does not check blood sugar, states he has not been instructed to and does not have a glucometer Patient reports he continues to get outside and walk his dog daily  Planned Interventions: Reviewed medications with  patient and discussed importance of medication adherence;        Counseled on importance of regular laboratory monitoring as prescribed;        Review of patient status, including review of consultants reports, relevant laboratory and other test results, and medications completed;       Reinforced carbohydrate modified diet Reviewed upcoming scheduled appointments  Symptom Management: Take medications as prescribed   Attend all scheduled provider appointments Call pharmacy for medication refills 3-7 days in advance of running out of medications Attend church or other social activities Perform all self care activities independently  Perform IADL's (shopping, preparing meals, housekeeping, managing finances) independently Call provider office for new concerns or questions  check feet daily for cuts, sores or redness trim toenails straight across fill half of plate with vegetables limit fast food meals to no more than 1 per week manage portion size prepare main meal at home 3 to 5 days each week read food labels for fat, fiber, carbohydrates and portion size Continue walking your dog daily  Follow Up Plan: Telephone follow up appointment with care management team member scheduled for:  07/16/2022       CCM (HYPERLIPIDEMIA) EXPECTED OUTCOME: MONITOR, SELF-MANAGE AND REDUCE SYMPTOMS OF HYPERLIPIDEMIA       Current Barriers:  Knowledge Deficits related to Hyperlipidemia management Chronic Disease Management support and education needs related to Hyperlipidemia, diet No Advanced Directives in place- documents previously mailed Patient reports he lives alone, has friends, family, neighbors he can call on if needed, reports he is independent in all aspects of his  care, stays busy around the house, plays golf frequently, walks the dog  Planned Interventions: Provider established cholesterol goals reviewed; Counseled on importance of regular laboratory monitoring as prescribed; Reviewed  importance of limiting foods high in cholesterol; Reviewed exercise goals and target of 150 minutes per week; Reviewed heart healthy diet   Symptom Management: Take medications as prescribed   Attend all scheduled provider appointments Call pharmacy for medication refills 3-7 days in advance of running out of medications Attend church or other social activities Perform all self care activities independently  Perform IADL's (shopping, preparing meals, housekeeping, managing finances) independently Call provider office for new concerns or questions  - call doctor with any symptoms you believe are related to your medicine - call doctor when you experience any new symptoms - go to all doctor appointments as scheduled - adhere to prescribed diet: heart healthy - develop an exercise routine Complete Advanced directives packet Follow heart healthy diet- bake or broil foods instead of frying  Follow Up Plan: Telephone follow up appointment with care management team member scheduled for:  07/16/22 at 215 pm          Patient verbalizes understanding of instructions and care plan provided today and agrees to view in [place]. Active MyChart status and patient understanding of how to access instructions and care plan via MyChart confirmed with patient.     Telephone follow up appointment with care management team member scheduled for:  07/16/22 at 215 pm

## 2022-04-16 NOTE — Chronic Care Management (AMB) (Signed)
Chronic Care Management   CCM RN Visit Note  04/16/2022 Name: [name]. MRN: [id number] DOB: 1950/10/10  Subjective: [name]. is a 72 y.o. year old male who is a primary care patient of Sharion Balloon, FNP. The patient was referred to the Chronic Care Management team for assistance with care management needs subsequent to provider initiation of CCM services and plan of care.    Today's Visit:  Engaged with patient by telephone for follow up visit.        Goals Addressed             This Visit's Progress    CCM (DIABETES) EXPECTED OUTCOME:  MONITOR, SELF-MANAGE AND REDUCE SYMPTOMS OF DIABETES       Current Barriers:  Knowledge Deficits related to Diabetes management Chronic Disease Management support and education needs related to Diabetes, diet Patient reports he does not check blood sugar, states he has not been instructed to and does not have a glucometer Patient reports he continues to get outside and walk his dog daily  Planned Interventions: Reviewed medications with patient and discussed importance of medication adherence;        Counseled on importance of regular laboratory monitoring as prescribed;        Review of patient status, including review of consultants reports, relevant laboratory and other test results, and medications completed;       Reinforced carbohydrate modified diet Reviewed upcoming scheduled appointments  Symptom Management: Take medications as prescribed   Attend all scheduled provider appointments Call pharmacy for medication refills 3-7 days in advance of running out of medications Attend church or other social activities Perform all self care activities independently  Perform IADL's (shopping, preparing meals, housekeeping, managing finances) independently Call provider office for new concerns or questions  check feet daily for cuts, sores or redness trim toenails straight across fill half of plate with  vegetables limit fast food meals to no more than 1 per week manage portion size prepare main meal at home 3 to 5 days each week read food labels for fat, fiber, carbohydrates and portion size Continue walking your dog daily  Follow Up Plan: Telephone follow up appointment with care management team member scheduled for:  07/16/2022       CCM (HYPERLIPIDEMIA) EXPECTED OUTCOME: MONITOR, SELF-MANAGE AND REDUCE SYMPTOMS OF HYPERLIPIDEMIA       Current Barriers:  Knowledge Deficits related to Hyperlipidemia management Chronic Disease Management support and education needs related to Hyperlipidemia, diet No Advanced Directives in place- documents previously mailed Patient reports he lives alone, has friends, family, neighbors he can call on if needed, reports he is independent in all aspects of his care, stays busy around the house, plays golf frequently, walks the dog  Planned Interventions: Provider established cholesterol goals reviewed; Counseled on importance of regular laboratory monitoring as prescribed; Reviewed importance of limiting foods high in cholesterol; Reviewed exercise goals and target of 150 minutes per week; Reviewed heart healthy diet   Symptom Management: Take medications as prescribed   Attend all scheduled provider appointments Call pharmacy for medication refills 3-7 days in advance of running out of medications Attend church or other social activities Perform all self care activities independently  Perform IADL's (shopping, preparing meals, housekeeping, managing finances) independently Call provider office for new concerns or questions  - call doctor with any symptoms you believe are related to your medicine - call doctor when you experience any new symptoms - go to all doctor  appointments as scheduled - adhere to prescribed diet: heart healthy - develop an exercise routine Complete Advanced directives packet Follow heart healthy diet- bake or broil foods  instead of frying  Follow Up Plan: Telephone follow up appointment with care management team member scheduled for:  07/16/22 at 215 pm          Plan:Telephone follow up appointment with care management team member scheduled for:  07/16/22 at 215 pm  [facility], BSN RN Case Manager [place] [phone]

## 2022-04-28 ENCOUNTER — Other Ambulatory Visit: Payer: Medicare HMO

## 2022-04-28 ENCOUNTER — Telehealth: Payer: Self-pay | Admitting: Internal Medicine

## 2022-04-28 DIAGNOSIS — E781 Pure hyperglyceridemia: Secondary | ICD-10-CM | POA: Diagnosis not present

## 2022-04-28 NOTE — Telephone Encounter (Signed)
Spoke to patient, patient request labs to be completed at [facility] [place] labs are placed for [facility] but faxed orders as well.    Patient aware and verbalized understanding.

## 2022-04-28 NOTE — Telephone Encounter (Signed)
Pt would like to go to [place] to have labs drawn prior to his appointment with Dr. Debara Pickett on 3/21. Fax Number for [employer]: [phone].

## 2022-04-29 LAB — APOLIPOPROTEIN B: Apolipoprotein B: 175 mg/dL — ABNORMAL HIGH (ref <90)

## 2022-04-29 LAB — LIPID PANEL
Chol/HDL Ratio: 20.4 ratio — ABNORMAL HIGH (ref 0.0–5.0)
Cholesterol, Total: 286 mg/dL — ABNORMAL HIGH (ref 100–199)
HDL: 14 mg/dL — ABNORMAL LOW (ref >39)
Triglycerides: 1289 mg/dL (ref 0–149)

## 2022-05-01 ENCOUNTER — Encounter (HOSPITAL_BASED_OUTPATIENT_CLINIC_OR_DEPARTMENT_OTHER): Payer: Self-pay | Admitting: Internal Medicine

## 2022-05-01 ENCOUNTER — Ambulatory Visit (HOSPITAL_BASED_OUTPATIENT_CLINIC_OR_DEPARTMENT_OTHER): Payer: Medicare HMO | Admitting: Internal Medicine

## 2022-05-01 VITALS — BP 134/69 | HR 94 | Ht 75.0 in | Wt 220.0 lb

## 2022-05-01 DIAGNOSIS — I700 Atherosclerosis of aorta: Secondary | ICD-10-CM

## 2022-05-01 DIAGNOSIS — I2510 Atherosclerotic heart disease of native coronary artery without angina pectoris: Secondary | ICD-10-CM

## 2022-05-01 DIAGNOSIS — T466X5D Adverse effect of antihyperlipidemic and antiarteriosclerotic drugs, subsequent encounter: Secondary | ICD-10-CM

## 2022-05-01 DIAGNOSIS — E781 Pure hyperglyceridemia: Secondary | ICD-10-CM | POA: Diagnosis not present

## 2022-05-01 DIAGNOSIS — M791 Myalgia, unspecified site: Secondary | ICD-10-CM | POA: Diagnosis not present

## 2022-05-01 DIAGNOSIS — T466X5A Adverse effect of antihyperlipidemic and antiarteriosclerotic drugs, initial encounter: Secondary | ICD-10-CM

## 2022-05-01 NOTE — Progress Notes (Signed)
LIPID CLINIC CONSULT NOTE  Chief Complaint:  Follow-up triglycerides  Primary Care Physician: Sharion Balloon, FNP  Primary Cardiologist:  None  HPI:  [name]. is a 72 y.o. male who is being seen today for the evaluation of high triglycerides at the request of Sharion Balloon, FNP. This is a send 72 year old male kindly referred for evaluation management of high triglycerides.  He reports a longstanding history of this.  Unfortunately he could not tolerate statins.  He had tried Lipitor in the past which caused muscle aches and more recently was on low-dose rosuvastatin but has not been taking it due to similar side effects.  His triglycerides have always run high in the 100s if not thousands.  Recent labs showed total cholesterol of 295, triglycerides 1371 and HDL 15.  LDL was not calculated.  His medication regimen includes fenofibrate 145 mg every day and Nexlizet 180/10 mg every day.  He reports trying to avoid saturated fats in his diet.  He does have known coronary disease including an occluded RCA and additional disease noted by cath.  He has been followed by Dr. Percival Spanish and was last seen in 2021.  05/01/2022  [name] is seen today in follow-up of his high triglycerides.  Repeat labs demonstrate total cholesterol 286 which is down from 384 about 3 months ago.  Triglycerides 1289, HDL 14 and LDL could not be calculated.  ApoB was obtained and was 175, however this is improved from 213 about 3 months ago.  He is taking  Nexlizet, fenofibrate and Vascepa.  Overall he is tolerating his meds.  He says that he sometimes has trouble remembering to take the Vascepa more than 1 capsule/day.  PMHx:  Past Medical History:  Diagnosis Date   Angina at rest    Benign neoplasm of colon    CAD (coronary artery disease)     Occluded RCA, 50% Diat 2006, 2008   Diverticulosis of colon (without mention of hemorrhage)    Esophageal reflux    Other and unspecified hyperlipidemia     Unspecified disorder of thyroid     Past Surgical History:  Procedure Laterality Date   TONSILLECTOMY  1955    FAMHx:  Family History  Problem Relation Age of Onset   Hyperlipidemia Father    Heart disease Father        pacemaker / CHF    Dementia Maternal Grandmother    Cancer Maternal Grandfather        throat   Alzheimer's disease Paternal Grandmother    Cancer Paternal Grandfather        lung   Cancer Sister        breast   Colon cancer Neg Hx     SOCHx:   reports that he quit smoking about 35 years ago. His smoking use included cigarettes. He smoked an average of .5 packs per day. He has never used smokeless tobacco. He reports that he does not currently use alcohol. He reports that he does not use drugs.  ALLERGIES:  Allergies  Allergen Reactions   Imdur [Isosorbide Nitrate]     Headache    Lipitor [Atorvastatin] Other (See Comments)    myalgias    ROS: Pertinent items noted in HPI and remainder of comprehensive ROS otherwise negative.  HOME MEDS: Current Outpatient Medications on File Prior to Visit  Medication Sig Dispense Refill   aspirin 81 MG EC tablet Take 81 mg by mouth daily.  Bempedoic Acid-Ezetimibe (NEXLIZET) 180-10 MG TABS TAKE ONE (1) TABLET BY MOUTH EVERY DAY 30 tablet 4   fenofibrate (TRICOR) 145 MG tablet TAKE ONE (1) TABLET BY MOUTH EVERY DAY 30 tablet 5   fluticasone (FLONASE) 50 MCG/ACT nasal spray Place 2 sprays into both nostrils daily. 16 g 6   icosapent Ethyl (VASCEPA) 1 g capsule Take 2 capsules (2 g total) by mouth 2 (two) times daily. 120 capsule 5   levothyroxine (SYNTHROID) 112 MCG tablet TAKE ONE (1) TABLET BY MOUTH EVERY DAY 30 tablet 8   metFORMIN (GLUCOPHAGE) 500 MG tablet Take 1 tablet (500 mg total) by mouth 2 (two) times daily with a meal. 180 tablet 1   omeprazole (PRILOSEC) 20 MG capsule Take 2 caps po daily 180 capsule 3   ranolazine (RANEXA) 500 MG 12 hr tablet TAKE ONE (1) TABLET BY MOUTH TWO (2) TIMES DAILY 60  tablet 5   Vitamin D, Ergocalciferol, (DRISDOL) 1.25 MG (50000 UNIT) CAPS capsule TAKE 1 CAPSULE BY MOUTH EVERY 7 DAYS 12 capsule 1   No current facility-administered medications on file prior to visit.    LABS/IMAGING: No results found for this or any previous visit (from the past 48 hour(s)). No results found.  LIPID PANEL:    Component Value Date/Time   CHOL 286 (H) 04/28/2022 0938   CHOL 202 (H) 10/15/2012 0854   TRIG 1,289 (HH) 04/28/2022 0938   TRIG 590 (HH) 06/08/2014 1703   TRIG 241 (H) 10/15/2012 0854   HDL 14 (L) 04/28/2022 0938   HDL 43 06/08/2014 1703   HDL 39 (L) 10/15/2012 0854   CHOLHDL 20.4 (H) 04/28/2022 0938   CHOLHDL 5.9 CALC 01/18/2007 0846   VLDL 55 (H) 01/18/2007 0846   LDLCALC Comment (A) 04/28/2022 0938   LDLCALC 115 (H) 10/15/2012 0854   LDLDIRECT 72 05/13/2016 1100   LDLDIRECT 124.9 01/18/2007 0846    WEIGHTS: Wt Readings from Last 3 Encounters:  05/01/22 220 lb (99.8 kg)  01/28/22 213 lb (96.6 kg)  01/20/22 214 lb (97.1 kg)    VITALS: BP 134/69 (BP Location: Left Arm, Patient Position: Sitting, Cuff Size: Large)   Pulse 94   Ht 6\' 3"  (1.905 m)   Wt 220 lb (99.8 kg)   BMI 27.50 kg/m   EXAM: Deferred  EKG: Deferred  ASSESSMENT: Mixed dyslipidemia with high triglycerides Coronary artery disease Obstructive sleep apnea Statin intolerance-myalgias  PLAN: 1.   Mr. Encarnacion continues to have elevated triglycerides although overall cholesterol has improved.  He seems to be tolerating his current regimen.  We discussed increasing his Vascepa to 2 capsules twice daily if he can tolerate it.  For more physical activity.  He says he is interested in playing some more golf and hopefully can start a walking program.  Plan repeat lipids in 6 months and follow-up afterwards.  Pixie Casino, MD, [facility], [place] Director of the Advanced Lipid Disorders &  Cardiovascular Risk Reduction Clinic Diplomate of the  American Board of Clinical Lipidology Attending Cardiologist  Direct Dial: [phone]  Fax: [phone]  Website:  [email or web address] Tedric Leeth 05/01/2022, 4:02 PM

## 2022-05-01 NOTE — Patient Instructions (Signed)
Medication Instructions:  INCREASE vascepa to 2 capsules twice daily  *If you need a refill on your cardiac medications before your next appointment, please call your pharmacy*   Lab Work: FASTING lab work to check cholesterol in about 6 months ** complete one week before next visit  If you have labs (blood work) drawn today and your tests are completely normal, you will receive your results only by: [place] (if you have MyChart) OR A paper copy in the mail If you have any lab test that is abnormal or we need to change your treatment, we will call you to review the results.    Follow-Up: At [facility], you and your health needs are our priority.  As part of our continuing mission to provide you with exceptional heart care, we have created designated Provider Care Teams.  These Care Teams include your primary Cardiologist (physician) and Advanced Practice Providers (APPs -  Physician Assistants and Nurse Practitioners) who all work together to provide you with the care you need, when you need it.  We recommend signing up for the patient portal called "MyChart".  Sign up information is provided on this After Visit Summary.  MyChart is used to connect with patients for Virtual Visits (Telemedicine).  Patients are able to view lab/test results, encounter notes, upcoming appointments, etc.  Non-urgent messages can be sent to your provider as well.   To learn more about what you can do with MyChart, go to [email or web address].    Your next appointment:    6 months with Dr. Debara Pickett

## 2022-05-09 ENCOUNTER — Other Ambulatory Visit: Payer: Self-pay | Admitting: Family

## 2022-05-09 DIAGNOSIS — K219 Gastro-esophageal reflux disease without esophagitis: Secondary | ICD-10-CM

## 2022-05-11 DIAGNOSIS — Z7984 Long term (current) use of oral hypoglycemic drugs: Secondary | ICD-10-CM | POA: Diagnosis not present

## 2022-05-11 DIAGNOSIS — E785 Hyperlipidemia, unspecified: Secondary | ICD-10-CM | POA: Diagnosis not present

## 2022-05-11 DIAGNOSIS — E1169 Type 2 diabetes mellitus with other specified complication: Secondary | ICD-10-CM | POA: Diagnosis not present

## 2022-05-13 ENCOUNTER — Telehealth: Payer: Self-pay | Admitting: Primary Care

## 2022-05-13 NOTE — Telephone Encounter (Signed)
Pt called the office and I scheduled him a f/u with BW. Nothing further needed.

## 2022-05-13 NOTE — Telephone Encounter (Signed)
Corey Ehrich, NP  Corey Levy, RN; [name], [name], CMA Yes, he will likely need to repeat sleep study if there was a break in therapy. I will have my nurse reach out to him to set up a follow-up.  Raquel Sarna can you please schedule visit  Thanks -Beth       Previous Messages    ----- Message ----- From: Corey Levy, RN Sent: 05/01/2022   4:20 PM EDT To: Corey Ehrich, NP Subject: would like to resume CPAP                      Desiree Hane!  Dr. Debara Pickett saw this patient for lipid clinic today. He mentioned that he would like to resume his CPAP but he had to return his equipment due to previous non-compliance. Is your team able to contact him to get him re-established on this treatment?  Thanks!  Clarnce Flock., RN       Attempted to call pt but unable to reach. Left message to return call.

## 2022-05-16 ENCOUNTER — Other Ambulatory Visit: Payer: Self-pay | Admitting: Family

## 2022-05-16 DIAGNOSIS — I2510 Atherosclerotic heart disease of native coronary artery without angina pectoris: Secondary | ICD-10-CM

## 2022-05-16 NOTE — Telephone Encounter (Signed)
Pt called to make sure we got the refill request from the pharmacy on his RANEXA.   Per last OV notes from [place], pt is not due for another appt until June.   Can someone send in his refills?

## 2022-05-20 ENCOUNTER — Ambulatory Visit: Payer: Medicare HMO | Admitting: Primary Care

## 2022-05-20 ENCOUNTER — Encounter: Payer: Self-pay | Admitting: Primary Care

## 2022-05-20 VITALS — BP 122/80 | HR 71 | Ht 75.0 in | Wt 218.8 lb

## 2022-05-20 DIAGNOSIS — G4733 Obstructive sleep apnea (adult) (pediatric): Secondary | ICD-10-CM | POA: Diagnosis not present

## 2022-05-20 DIAGNOSIS — G4730 Sleep apnea, unspecified: Secondary | ICD-10-CM | POA: Diagnosis not present

## 2022-05-20 NOTE — Progress Notes (Signed)
@Patient  ID: [name]., male    DOB: 1950/04/05, 72 y.o.   MRN: [id number]  Chief Complaint  Patient presents with   Follow-up    Restart CPAP HST 10/04/17 [place] 11    Referring provider: [name], FNP  HPI: 72 year old male, former smoker. PMH significant for hypothyroidism, hyperlipidemia, CAD and GERD. Patient of Dr. [name].   05/20/2022- Interim hx  Patient presents today for overdue follow-up OSA. HST 10/04/2017 showed severe OSA, AHI 50/hour with SpO2 low 66%. He stopped consistently wearing CPAP a couple years back, he had some difficulty tolerating but is open to resuming. CPAP machine is 67-63 years old. He is not currently established with DME company to receive CPAP supplies. He recently purchased new tubing for CPAP out of pocket. Previous DME was [facility].   Allergies  Allergen Reactions   Imdur [Isosorbide Nitrate]     Headache    Lipitor [Atorvastatin] Other (See Comments)    myalgias    Immunization History  Administered Date(s) Administered   Influenza,inj,Quad PF,6+ Mos 01/20/2022   Tdap 12/08/2007    Past Medical History:  Diagnosis Date   Angina at rest    Benign neoplasm of colon    CAD (coronary artery disease)     Occluded RCA, 50% Diat 2006, 2008   Diverticulosis of colon (without mention of hemorrhage)    Esophageal reflux    Other and unspecified hyperlipidemia    Unspecified disorder of thyroid     Tobacco History: Social History   Tobacco Use  Smoking Status Former   Packs/day: .5   Types: Cigarettes   Quit date: 07/22/1986   Years since quitting: 35.8  Smokeless Tobacco Never   Counseling given: Not Answered   Outpatient Medications Prior to Visit  Medication Sig Dispense Refill   aspirin 81 MG EC tablet Take 81 mg by mouth daily.     Bempedoic Acid-Ezetimibe (NEXLIZET) 180-10 MG TABS TAKE ONE (1) TABLET BY MOUTH EVERY DAY 30 tablet 4   fenofibrate (TRICOR) 145 MG tablet TAKE ONE (1) TABLET BY MOUTH EVERY DAY 30  tablet 5   fluticasone (FLONASE) 50 MCG/ACT nasal spray Place 2 sprays into both nostrils daily. 16 g 6   icosapent Ethyl (VASCEPA) 1 g capsule Take 2 capsules (2 g total) by mouth 2 (two) times daily. 120 capsule 5   levothyroxine (SYNTHROID) 112 MCG tablet TAKE ONE (1) TABLET BY MOUTH EVERY DAY 30 tablet 8   metFORMIN (GLUCOPHAGE) 500 MG tablet Take 1 tablet (500 mg total) by mouth 2 (two) times daily with a meal. 180 tablet 1   omeprazole (PRILOSEC) 20 MG capsule TAKE 2 CAPSULES BY MOUTH DAILY 180 capsule 0   ranolazine (RANEXA) 500 MG 12 hr tablet TAKE ONE (1) TABLET BY MOUTH TWO (2) TIMES DAILY 60 tablet 1   Vitamin D, Ergocalciferol, (DRISDOL) 1.25 MG (50000 UNIT) CAPS capsule TAKE 1 CAPSULE BY MOUTH EVERY 7 DAYS 12 capsule 1   No facility-administered medications prior to visit.   Review of Systems  Review of Systems  Constitutional: Negative.   HENT: Negative.    Respiratory: Negative.    Cardiovascular: Negative.    Physical Exam  BP 122/80 (BP Location: Left Arm, Patient Position: Sitting, Cuff Size: Large)   Pulse 71   Ht 6\' 3"  (1.905 m)   Wt 218 lb 12.8 oz (99.2 kg)   SpO2 99%   BMI 27.35 kg/m  Physical Exam Constitutional:  Appearance: Normal appearance.  HENT:     Head: Normocephalic and atraumatic.     Mouth/Throat:     Mouth: Mucous membranes are moist.     Pharynx: Oropharynx is clear.  Cardiovascular:     Rate and Rhythm: Normal rate and regular rhythm.  Pulmonary:     Effort: Pulmonary effort is normal.     Breath sounds: Normal breath sounds.  Musculoskeletal:        General: Normal range of motion.  Skin:    General: Skin is warm and dry.  Neurological:     General: No focal deficit present.     Mental Status: He is alert and oriented to person, place, and time. Mental status is at baseline.  Psychiatric:        Mood and Affect: Mood normal.        Behavior: Behavior normal.        Thought Content: Thought content normal.        Judgment:  Judgment normal.      Lab Results:  CBC    Component Value Date/Time   WBC 4.1 01/20/2022 1138   WBC 6.1 06/08/2014 1710   WBC 5.2 01/18/2007 1053   RBC 4.07 (L) 01/20/2022 1138   RBC 5.00 06/08/2014 1710   RBC 4.43 01/18/2007 1053   HGB 12.4 (L) 01/20/2022 1138   HCT 35.6 (L) 01/20/2022 1138   PLT 318 01/20/2022 1138   MCV 88 01/20/2022 1138   MCH 30.5 01/20/2022 1138   MCH 27.2 06/08/2014 1710   MCHC 34.8 01/20/2022 1138   MCHC 31.3 (A) 06/08/2014 1710   MCHC 34.6 01/18/2007 1053   RDW 14.7 01/20/2022 1138   LYMPHSABS 1.4 01/20/2022 1138   MONOABS 0.4 01/18/2007 1053   EOSABS 0.3 01/20/2022 1138   BASOSABS 0.0 01/20/2022 1138    BMET    Component Value Date/Time   NA 137 01/20/2022 1138   K 4.3 01/20/2022 1138   CL 98 01/20/2022 1138   CO2 23 01/20/2022 1138   GLUCOSE 121 (H) 01/20/2022 1138   GLUCOSE 101 (H) 10/15/2012 0854   BUN 19 01/20/2022 1138   CREATININE 1.14 01/20/2022 1138   CREATININE 1.40 (H) 10/15/2012 0854   CALCIUM 9.3 01/20/2022 1138   GFRNONAA 91 09/27/2019 1648   GFRNONAA 53 (L) 10/15/2012 0854   GFRAA 105 09/27/2019 1648   GFRAA 61 10/15/2012 0854    BNP No results found for: "BNP"  ProBNP No results found for: "PROBNP"  Imaging: No results found.   Assessment & Plan:   Obstructive sleep apnea - Sleep study in 2019 showed severe obstructive sleep apnea, AHI 50/hour apneic events an hour with a low oxygen level of 66%.  Patient was previously on CPAP but stopped wearing several years ago.  He is open to resuming use.  Unclear if current machine is in working order, he is likely due for replacement in the next month or two.  Reviewed risks of untreated sleep apnea including cardiac arrhythmias, stroke, pulmonary hypertension and diabetes. Due to break in therapy we will need to repeat sleep study, recommending getting in lab study with titration study to determine correct pressure settings and supplemental oxygen need.  After  completing sleep study we can place orders for either new CPAP machine and/or CPAP supplies.   Orders: Split night sleep study   Follow-up: 2-3 months with Waynetta Sandy NP for CPAP follow-up    Glenford Bayley, NP 05/20/2022

## 2022-05-20 NOTE — Patient Instructions (Signed)
Sleep study in 2019 showed severe obstructive sleep apnea, you had on average 50 apneic events an hour with a low oxygen level of 66%  Sleep apnea puts you at high risk for cardiac arrhythmias, stroke, pulmonary hypertension and diabetes.  Is important that we get you started back on CPAP.  Due to break in therapy will need to repeat sleep study, recommending getting in lab study with titration study to determine correct pressure settings and supplemental oxygen need.  If this is completed we can place orders for you to either new CPAP machine and/or be provided CPAP supplies  Orders: Split night sleep study   Follow-up: 2-3 months with Beth NP for CPAP follow-up

## 2022-05-20 NOTE — Assessment & Plan Note (Signed)
-   Sleep study in 2019 showed severe obstructive sleep apnea, AHI 50/hour apneic events an hour with a low oxygen level of 66%.  Patient was previously on CPAP but stopped wearing several years ago.  He is open to resuming use.  Unclear if current machine is in working order, he is likely due for replacement in the next month or two.  Reviewed risks of untreated sleep apnea including cardiac arrhythmias, stroke, pulmonary hypertension and diabetes. Due to break in therapy we will need to repeat sleep study, recommending getting in lab study with titration study to determine correct pressure settings and supplemental oxygen need.  After completing sleep study we can place orders for either new CPAP machine and/or CPAP supplies.   Orders: Split night sleep study   Follow-up: 2-3 months with Beth NP for CPAP follow-up

## 2022-05-27 ENCOUNTER — Telehealth: Payer: Self-pay | Admitting: Family

## 2022-05-27 NOTE — Telephone Encounter (Signed)
Called patient to schedule Medicare Annual Wellness Visit (AWV). Left message for patient to call back and schedule Medicare Annual Wellness Visit (AWV).   Patient has [employer] (Calendar) can be scheduled sooner than  10/18/2022   Last date of AWV: 10/16/2021   Please schedule an appointment at any time with either Vernona Rieger or [place], [facility]'s. .  If any questions, please contact me at [phone].  Thank you,  Judeth Cornfield,  AMB Clinical Support [facility] AWV Program Direct Dial ??[phone]

## 2022-06-13 ENCOUNTER — Other Ambulatory Visit: Payer: Self-pay | Admitting: Family

## 2022-06-13 DIAGNOSIS — I2510 Atherosclerotic heart disease of native coronary artery without angina pectoris: Secondary | ICD-10-CM

## 2022-06-16 ENCOUNTER — Telehealth: Payer: Self-pay | Admitting: Family

## 2022-06-16 NOTE — Telephone Encounter (Signed)
Contacted [name]. to schedule their annual wellness visit. Appointment made for 10/20/2022.  Thank you,  [name],  AMB Clinical Support [facility] AWV Program Direct Dial ??[phone]

## 2022-06-20 ENCOUNTER — Telehealth: Payer: Self-pay | Admitting: Family

## 2022-06-20 NOTE — Telephone Encounter (Signed)
Contacted [name]. to schedule their annual wellness visit. Appointment made for 10/20/2022.  Thank you,  Stephanie,  AMB Clinical Support [facility] AWV Program Direct Dial ??[phone]   

## 2022-07-16 ENCOUNTER — Other Ambulatory Visit: Payer: Self-pay | Admitting: Family

## 2022-07-16 ENCOUNTER — Ambulatory Visit: Payer: Medicare HMO | Admitting: *Deleted

## 2022-07-16 DIAGNOSIS — E1165 Type 2 diabetes mellitus with hyperglycemia: Secondary | ICD-10-CM

## 2022-07-16 DIAGNOSIS — E782 Mixed hyperlipidemia: Secondary | ICD-10-CM

## 2022-07-16 DIAGNOSIS — I2510 Atherosclerotic heart disease of native coronary artery without angina pectoris: Secondary | ICD-10-CM

## 2022-07-16 NOTE — Patient Instructions (Signed)
Please call the care guide team at [phone] if you need to cancel or reschedule your appointment.   If you are experiencing a Mental Health or Behavioral Health Crisis or need someone to talk to, please call the Suicide and Crisis Lifeline: 988 call the [place] National Suicide Prevention Lifeline: 202-847-2612 or TTY: (310) 396-5594 TTY 845 217 2473) to talk to a trained counselor call 1-800-273-TALK (toll free, 24 hour hotline) go to [facility] [address], [place] [phone]) call the [facility]: [phone] call 911   Following is a copy of the CCM Program Consent:  CCM service includes personalized support from designated clinical staff supervised by the physician, including individualized plan of care and coordination with other care providers 24/7 contact phone numbers for assistance for urgent and routine care needs. Service will only be billed when office clinical staff spend 20 minutes or more in a month to coordinate care. Only one practitioner may furnish and bill the service in a calendar month. The patient may stop CCM services at amy time (effective at the end of the month) by phone call to the office staff. The patient will be responsible for cost sharing (co-pay) or up to 20% of the service fee (after annual deductible is met)  Following is a copy of your full provider care plan:   Goals Addressed             This Visit's Progress    COMPLETED: CCM (DIABETES) EXPECTED OUTCOME:  MONITOR, SELF-MANAGE AND REDUCE SYMPTOMS OF DIABETES       Current Barriers:  Knowledge Deficits related to Diabetes management Chronic Disease Management support and education needs related to Diabetes, diet Patient reports he does not check blood sugar, states he has not been instructed to and does not have a glucometer Patient reports he continues to get outside and walk his dog daily Patient has no new changes to  report  Planned Interventions: Reviewed medications with patient and discussed importance of medication adherence;        Counseled on importance of regular laboratory monitoring as prescribed;        Review of patient status, including review of consultants reports, relevant laboratory and other test results, and medications completed;       Reinforced carbohydrate modified diet Reviewed upcoming scheduled appointments Reviewed discharge plan with patient and agreeable with case closure as no further needs for care management  Symptom Management: Take medications as prescribed   Attend all scheduled provider appointments Call pharmacy for medication refills 3-7 days in advance of running out of medications Attend church or other social activities Perform all self care activities independently  Perform IADL's (shopping, preparing meals, housekeeping, managing finances) independently Call provider office for new concerns or questions  check feet daily for cuts, sores or redness trim toenails straight across fill half of plate with vegetables limit fast food meals to no more than 1 per week manage portion size prepare main meal at home 3 to 5 days each week read food labels for fat, fiber, carbohydrates and portion size Continue walking your dog daily Case closed today, please let your doctor know if you have any further needs for care management  Follow Up Plan: No further follow up required, case closed       COMPLETED: CCM (HYPERLIPIDEMIA) EXPECTED OUTCOME: MONITOR, SELF-MANAGE AND REDUCE SYMPTOMS OF HYPERLIPIDEMIA       Current Barriers:  Knowledge Deficits related to Hyperlipidemia management Chronic Disease Management support and education  needs related to Hyperlipidemia, diet No Advanced Directives in place- documents previously mailed Patient reports he lives alone, has friends, family, neighbors he can call on if needed, reports he is independent in all aspects of his care,  stays busy around the house, plays golf frequently, walks the dog, no new concerns reported today  Planned Interventions: Provider established cholesterol goals reviewed; Counseled on importance of regular laboratory monitoring as prescribed; Reviewed importance of limiting foods high in cholesterol; Reviewed exercise goals and target of 150 minutes per week; Reinforced heart healthy diet  Reviewed plan of care with pt  Symptom Management: Take medications as prescribed   Attend all scheduled provider appointments Call pharmacy for medication refills 3-7 days in advance of running out of medications Attend church or other social activities Perform all self care activities independently  Perform IADL's (shopping, preparing meals, housekeeping, managing finances) independently Call provider office for new concerns or questions  - call doctor with any symptoms you believe are related to your medicine - call doctor when you experience any new symptoms - go to all doctor appointments as scheduled - adhere to prescribed diet: heart healthy - develop an exercise routine Complete Advanced directives packet Follow heart healthy diet- bake or broil foods instead of frying Case closure today  Follow Up Plan:  No further outreach, case closed          Patient verbalizes understanding of instructions and care plan provided today and agrees to view in MyChart. Active MyChart status and patient understanding of how to access instructions and care plan via MyChart confirmed with patient.  No further follow up required: Case closed

## 2022-07-16 NOTE — Chronic Care Management (AMB) (Signed)
Chronic Care Management   CCM RN Visit Note  07/16/2022 Name: [name]. MRN: [id number] DOB: 1951-01-12  Subjective: [name]. is a 72 y.o. year old male who is a primary care patient of [name], FNP. The patient was referred to the Chronic Care Management team for assistance with care management needs subsequent to provider initiation of CCM services and plan of care.    Today's Visit:  Engaged with patient by telephone for follow up visit.        Goals Addressed             This Visit's Progress    COMPLETED: CCM (DIABETES) EXPECTED OUTCOME:  MONITOR, SELF-MANAGE AND REDUCE SYMPTOMS OF DIABETES       Current Barriers:  Knowledge Deficits related to Diabetes management Chronic Disease Management support and education needs related to Diabetes, diet Patient reports he does not check blood sugar, states he has not been instructed to and does not have a glucometer Patient reports he continues to get outside and walk his dog daily Patient has no new changes to report  Planned Interventions: Reviewed medications with patient and discussed importance of medication adherence;        Counseled on importance of regular laboratory monitoring as prescribed;        Review of patient status, including review of consultants reports, relevant laboratory and other test results, and medications completed;       Reinforced carbohydrate modified diet Reviewed upcoming scheduled appointments Reviewed discharge plan with patient and agreeable with case closure as no further needs for care management  Symptom Management: Take medications as prescribed   Attend all scheduled provider appointments Call pharmacy for medication refills 3-7 days in advance of running out of medications Attend church or other social activities Perform all self care activities independently  Perform IADL's (shopping, preparing meals, housekeeping, managing finances) independently Call  provider office for new concerns or questions  check feet daily for cuts, sores or redness trim toenails straight across fill half of plate with vegetables limit fast food meals to no more than 1 per week manage portion size prepare main meal at home 3 to 5 days each week read food labels for fat, fiber, carbohydrates and portion size Continue walking your dog daily Case closed today, please let your doctor know if you have any further needs for care management  Follow Up Plan: No further follow up required, case closed       COMPLETED: CCM (HYPERLIPIDEMIA) EXPECTED OUTCOME: MONITOR, SELF-MANAGE AND REDUCE SYMPTOMS OF HYPERLIPIDEMIA       Current Barriers:  Knowledge Deficits related to Hyperlipidemia management Chronic Disease Management support and education needs related to Hyperlipidemia, diet No Advanced Directives in place- documents previously mailed Patient reports he lives alone, has friends, family, neighbors he can call on if needed, reports he is independent in all aspects of his care, stays busy around the house, plays golf frequently, walks the dog, no new concerns reported today  Planned Interventions: Provider established cholesterol goals reviewed; Counseled on importance of regular laboratory monitoring as prescribed; Reviewed importance of limiting foods high in cholesterol; Reviewed exercise goals and target of 150 minutes per week; Reinforced heart healthy diet  Reviewed plan of care with pt  Symptom Management: Take medications as prescribed   Attend all scheduled provider appointments Call pharmacy for medication refills 3-7 days in advance of running out of medications Attend church or other social activities Perform all self care  activities independently  Perform IADL's (shopping, preparing meals, housekeeping, managing finances) independently Call provider office for new concerns or questions  - call doctor with any symptoms you believe are related to  your medicine - call doctor when you experience any new symptoms - go to all doctor appointments as scheduled - adhere to prescribed diet: heart healthy - develop an exercise routine Complete Advanced directives packet Follow heart healthy diet- bake or broil foods instead of frying Case closure today  Follow Up Plan:  No further outreach, case closed          Plan:No further follow up required: Case closed  Irving Shows [facility], BSN RN Case Manager [facility] [phone]

## 2022-07-25 ENCOUNTER — Other Ambulatory Visit: Payer: Self-pay | Admitting: Family

## 2022-07-25 DIAGNOSIS — E559 Vitamin D deficiency, unspecified: Secondary | ICD-10-CM

## 2022-07-30 ENCOUNTER — Other Ambulatory Visit: Payer: Self-pay | Admitting: Family

## 2022-07-30 DIAGNOSIS — E782 Mixed hyperlipidemia: Secondary | ICD-10-CM

## 2022-08-01 ENCOUNTER — Ambulatory Visit (HOSPITAL_BASED_OUTPATIENT_CLINIC_OR_DEPARTMENT_OTHER): Payer: Medicare HMO | Attending: Primary Care | Admitting: Pulmonary Disease

## 2022-08-01 DIAGNOSIS — G4733 Obstructive sleep apnea (adult) (pediatric): Secondary | ICD-10-CM | POA: Insufficient documentation

## 2022-08-01 DIAGNOSIS — G4730 Sleep apnea, unspecified: Secondary | ICD-10-CM

## 2022-08-05 DIAGNOSIS — G4730 Sleep apnea, unspecified: Secondary | ICD-10-CM | POA: Diagnosis not present

## 2022-08-05 NOTE — Procedures (Signed)
     Patient Name: [name], [place] Study Date: 08/01/2022 Gender: Male D.O.B: 01-May-1950 Age (years): 65 Referring Provider: Ames Dura NP Height (inches): 73 Interpreting Physician: Coralyn Helling MD, ABSM Weight (lbs): 210 RPSGT: Cherylann Parr BMI: 28 MRN: [id number] Neck Size: 17.00  CLINICAL INFORMATION Sleep Study Type: NPSG  Indication for sleep study: Snoring  Epworth Sleepiness Score: 14  SLEEP STUDY TECHNIQUE As per the AASM Manual for the Scoring of Sleep and Associated Events v2.3 (April 2016) with a hypopnea requiring 4% desaturations.  The channels recorded and monitored were frontal, central and occipital EEG, electrooculogram (EOG), submentalis EMG (chin), nasal and oral airflow, thoracic and abdominal wall motion, anterior tibialis EMG, snore microphone, electrocardiogram, and pulse oximetry.  MEDICATIONS Medications self-administered by patient taken the night of the study : N/A  SLEEP ARCHITECTURE The study was initiated at 9:50:09 PM and ended at 4:10:31 AM.  Sleep onset time was 49.6 minutes and the sleep efficiency was 47.7%. The total sleep time was 181.3 minutes.  Stage REM latency was 173.0 minutes.  The patient spent 6.3% of the night in stage N1 sleep, 85.4% in stage N2 sleep, 0.0% in stage N3 and 8.3% in REM.  Alpha intrusion was absent.  Supine sleep was 0.00%.  RESPIRATORY PARAMETERS The overall apnea/hypopnea index (AHI) was 37.1 per hour. There were 63 total apneas, including 63 obstructive, 0 central and 0 mixed apneas. There were 49 hypopneas and 0 RERAs.  The AHI during Stage REM sleep was 20.0 per hour.  AHI while supine was N/A per hour.  The mean oxygen saturation was 91.7%. The minimum SpO2 during sleep was 78.0%.  Loud snoring was noted during this study.  CARDIAC DATA The 2 lead EKG demonstrated sinus rhythm. The mean heart rate was 57.6 beats per minute. Other EKG findings include: None.  LEG MOVEMENT DATA The  total PLMS were 0 with a resulting PLMS index of 0.0. Associated arousal with leg movement index was 0.0 .  IMPRESSIONS - Severe obstructive sleep apnea with an AHI of 37.1 and SpO2 low of 78%. - He didn't have sufficienty sleep during the initial portion of the study to qualify for split night protocol.  DIAGNOSIS - Obstructive Sleep Apnea (G47.33) - Nocturnal Hypoxemia (G47.36)  RECOMMENDATIONS - Additional therapies include CPAP, oral appliance or surgical assessment. - Avoid alcohol, sedatives and other CNS depressants that may worsen sleep apnea and disrupt normal sleep architecture. - Sleep hygiene should be reviewed to assess factors that may improve sleep quality. - Weight management and regular exercise should be initiated or continued if appropriate.  [Electronically signed] 08/05/2022 11:55 AM  Coralyn Helling MD, ABSM Diplomate, American Board of Sleep Medicine NPI: [id number]  [facility] PH: [phone]   FX: [phone] ACCREDITED BY THE AMERICAN ACADEMY OF SLEEP MEDICINE

## 2022-08-10 DIAGNOSIS — Z7984 Long term (current) use of oral hypoglycemic drugs: Secondary | ICD-10-CM

## 2022-08-10 DIAGNOSIS — E785 Hyperlipidemia, unspecified: Secondary | ICD-10-CM | POA: Diagnosis not present

## 2022-08-10 DIAGNOSIS — E1169 Type 2 diabetes mellitus with other specified complication: Secondary | ICD-10-CM | POA: Diagnosis not present

## 2022-08-12 ENCOUNTER — Other Ambulatory Visit: Payer: Self-pay | Admitting: Family

## 2022-08-18 ENCOUNTER — Other Ambulatory Visit: Payer: Self-pay | Admitting: Family

## 2022-08-18 DIAGNOSIS — I2510 Atherosclerotic heart disease of native coronary artery without angina pectoris: Secondary | ICD-10-CM

## 2022-08-18 MED ORDER — RANOLAZINE ER 500 MG PO TB12
500.0000 mg | ORAL_TABLET | Freq: Two times a day (BID) | ORAL | 0 refills | Status: DC
Start: 2022-08-18 — End: 2022-09-16

## 2022-08-18 NOTE — Telephone Encounter (Signed)
Pt made appt for 09/12/2022 pt is out of this medication please send refill to pharmacy

## 2022-08-18 NOTE — Telephone Encounter (Signed)
[facility] pt NTBS 30-d given 07/17/22

## 2022-08-18 NOTE — Addendum Note (Signed)
Addended by: Julious Payer D on: 08/18/2022 01:12 PM   Modules accepted: Orders

## 2022-08-28 ENCOUNTER — Other Ambulatory Visit: Payer: Self-pay | Admitting: Family

## 2022-08-28 DIAGNOSIS — E782 Mixed hyperlipidemia: Secondary | ICD-10-CM

## 2022-09-12 ENCOUNTER — Encounter: Payer: Self-pay | Admitting: Family

## 2022-09-12 ENCOUNTER — Ambulatory Visit: Payer: Medicare HMO | Admitting: Family

## 2022-09-12 VITALS — BP 125/71 | HR 66 | Temp 97.9°F | Ht 75.0 in | Wt 210.8 lb

## 2022-09-12 DIAGNOSIS — J0101 Acute recurrent maxillary sinusitis: Secondary | ICD-10-CM

## 2022-09-12 DIAGNOSIS — Z0001 Encounter for general adult medical examination with abnormal findings: Secondary | ICD-10-CM

## 2022-09-12 DIAGNOSIS — I25111 Atherosclerotic heart disease of native coronary artery with angina pectoris with documented spasm: Secondary | ICD-10-CM | POA: Diagnosis not present

## 2022-09-12 DIAGNOSIS — E039 Hypothyroidism, unspecified: Secondary | ICD-10-CM | POA: Diagnosis not present

## 2022-09-12 DIAGNOSIS — I700 Atherosclerosis of aorta: Secondary | ICD-10-CM | POA: Diagnosis not present

## 2022-09-12 DIAGNOSIS — M791 Myalgia, unspecified site: Secondary | ICD-10-CM | POA: Diagnosis not present

## 2022-09-12 DIAGNOSIS — T466X5A Adverse effect of antihyperlipidemic and antiarteriosclerotic drugs, initial encounter: Secondary | ICD-10-CM | POA: Diagnosis not present

## 2022-09-12 DIAGNOSIS — Z0000 Encounter for general adult medical examination without abnormal findings: Secondary | ICD-10-CM | POA: Diagnosis not present

## 2022-09-12 DIAGNOSIS — E782 Mixed hyperlipidemia: Secondary | ICD-10-CM

## 2022-09-12 DIAGNOSIS — E1165 Type 2 diabetes mellitus with hyperglycemia: Secondary | ICD-10-CM

## 2022-09-12 DIAGNOSIS — K219 Gastro-esophageal reflux disease without esophagitis: Secondary | ICD-10-CM | POA: Diagnosis not present

## 2022-09-12 DIAGNOSIS — G4733 Obstructive sleep apnea (adult) (pediatric): Secondary | ICD-10-CM

## 2022-09-12 DIAGNOSIS — Z125 Encounter for screening for malignant neoplasm of prostate: Secondary | ICD-10-CM | POA: Diagnosis not present

## 2022-09-12 LAB — BAYER DCA HB A1C WAIVED: HB A1C (BAYER DCA - WAIVED): 5.7 % — ABNORMAL HIGH (ref 4.8–5.6)

## 2022-09-12 MED ORDER — FENOFIBRATE 145 MG PO TABS
145.0000 mg | ORAL_TABLET | Freq: Every day | ORAL | 5 refills | Status: DC
Start: 2022-09-12 — End: 2023-10-13

## 2022-09-12 MED ORDER — FLUTICASONE PROPIONATE 50 MCG/ACT NA SUSP
2.0000 | Freq: Every day | NASAL | 6 refills | Status: DC
Start: 2022-09-12 — End: 2023-10-13

## 2022-09-12 MED ORDER — NEXLIZET 180-10 MG PO TABS: ORAL_TABLET | ORAL | 0 refills | Status: DC

## 2022-09-12 MED ORDER — ICOSAPENT ETHYL 1 G PO CAPS
2.0000 g | ORAL_CAPSULE | Freq: Two times a day (BID) | ORAL | 5 refills | Status: AC
Start: 2022-09-12 — End: ?

## 2022-09-12 MED ORDER — OMEPRAZOLE 20 MG PO CPDR
DELAYED_RELEASE_CAPSULE | ORAL | 0 refills | Status: DC
Start: 2022-09-12 — End: 2023-10-22

## 2022-09-12 MED ORDER — METFORMIN HCL 500 MG PO TABS: 500.0000 mg | ORAL_TABLET | Freq: Two times a day (BID) | ORAL | 0 refills | Status: DC

## 2022-09-12 NOTE — Patient Instructions (Signed)

## 2022-09-12 NOTE — Progress Notes (Signed)
Subjective:    Patient ID: [name]., male    DOB: [birth year], 72 y.o.   MRN: [id number]  Chief Complaint  Patient presents with   Medical Management of Chronic Issues   Pt presents to the office today for CPE and chronic follow up.  He is followed by Cardiologists every 3-4 months for CAD.    He has OSA and has stopped using his CPAP. He is followed by Sleep Med.    He has aortic atherosclerosis and CAD  but can not tolerate statin related to myalgia.   Gastroesophageal Reflux He complains of belching, heartburn and a hoarse voice. This is a chronic problem. The current episode started more than 1 year ago. The problem occurs occasionally. Associated symptoms include fatigue. Risk factors include obesity. He has tried a PPI for the symptoms. The treatment provided moderate relief.  Diabetes He presents for his follow-up diabetic visit. He has type 2 diabetes mellitus. Associated symptoms include fatigue. Pertinent negatives for diabetes include no blurred vision and no foot paresthesias. Symptoms are stable. Risk factors for coronary artery disease include dyslipidemia, diabetes mellitus, hypertension and sedentary lifestyle. He is following a generally healthy diet. (Does not check glucose at home)  Thyroid Problem Presents for follow-up visit. Symptoms include fatigue and hoarse voice. Patient reports no constipation, diarrhea or dry skin. The symptoms have been stable. His past medical history is significant for hyperlipidemia.  Hyperlipidemia This is a chronic problem. The current episode started more than 1 year ago. The problem is uncontrolled. Recent lipid tests were reviewed and are high. Exacerbating diseases include obesity. Current antihyperlipidemic treatment includes fibric acid derivatives and herbal therapy. The current treatment provides mild improvement of lipids. Risk factors for coronary artery disease include dyslipidemia, hypertension, a sedentary lifestyle and  male sex.      Review of Systems  Constitutional:  Positive for fatigue.  HENT:  Positive for hoarse voice.   Eyes:  Negative for blurred vision.  Gastrointestinal:  Positive for heartburn. Negative for constipation and diarrhea.  All other systems reviewed and are negative.  Family History  Problem Relation Age of Onset   Hyperlipidemia Father    Heart disease Father        pacemaker / CHF    Dementia Maternal Grandmother    Cancer Maternal Grandfather        throat   Alzheimer's disease Paternal Grandmother    Cancer Paternal Grandfather        lung   Cancer Sister        breast   Colon cancer Neg Hx    Social History   Socioeconomic History   Marital status: Widowed    Spouse name: Not on file   Number of children: 3   Years of education: 16   Highest education level: Bachelor's degree (e.g., BA, AB, BS)  Occupational History   Occupation: retired     Comment: golf / club prefessional   Tobacco Use   Smoking status: Former    Current packs/day: 0.00    Types: Cigarettes    Quit date: 07/22/1986    Years since quitting: 36.1   Smokeless tobacco: Never  Vaping Use   Vaping status: Never Used  Substance and Sexual Activity   Alcohol use: Not Currently   Drug use: No   Sexual activity: Not Currently  Other Topics Concern   Not on file  Social History Narrative   Retired, widowed, lives alone. 3 grown children. Enjoys golf.  Social Determinants of Health   Financial Resource Strain: Low Risk  (02/19/2022)   Overall Financial Resource Strain (CARDIA)    Difficulty of Paying Living Expenses: Not hard at all  Food Insecurity: No Food Insecurity (02/19/2022)   Hunger Vital Sign    Worried About Running Out of Food in the Last Year: Never true    Ran Out of Food in the Last Year: Never true  Transportation Needs: No Transportation Needs (02/19/2022)   PRAPARE - Administrator, Civil Service (Medical): No    Lack of Transportation (Non-Medical): No   Physical Activity: Sufficiently Active (02/19/2022)   Exercise Vital Sign    Days of Exercise per Week: 5 days    Minutes of Exercise per Session: 30 min  Stress: No Stress Concern Present (02/19/2022)   [employer] of Occupational Health - Occupational Stress Questionnaire    Feeling of Stress : Not at all  Social Connections: Moderately Integrated (02/19/2022)   Social Connection and Isolation Panel [NHANES]    Frequency of Communication with Friends and Family: More than three times a week    Frequency of Social Gatherings with Friends and Family: More than three times a week    Attends Religious Services: More than 4 times per year    Active Member of [employer] or Organizations: Yes    Attends Banker Meetings: More than 4 times per year    Marital Status: Widowed       Objective:   Physical Exam Vitals reviewed.  Constitutional:      General: He is not in acute distress.    Appearance: He is well-developed.  HENT:     Head: Normocephalic.     Right Ear: Tympanic membrane normal.     Left Ear: Tympanic membrane normal.  Eyes:     General:        Right eye: No discharge.        Left eye: No discharge.     Pupils: Pupils are equal, round, and reactive to light.  Neck:     Thyroid: No thyromegaly.  Cardiovascular:     Rate and Rhythm: Normal rate and regular rhythm.     Heart sounds: Normal heart sounds. No murmur heard. Pulmonary:     Effort: Pulmonary effort is normal. No respiratory distress.     Breath sounds: Normal breath sounds. No wheezing.  Abdominal:     General: Bowel sounds are normal. There is no distension.     Palpations: Abdomen is soft.     Tenderness: There is no abdominal tenderness.  Musculoskeletal:        General: No tenderness. Normal range of motion.     Cervical back: Normal range of motion and neck supple.  Skin:    General: Skin is warm and dry.     Findings: No erythema or rash.  Neurological:     Mental Status: He is  alert and oriented to person, place, and time.     Cranial Nerves: No cranial nerve deficit.     Deep Tendon Reflexes: Reflexes are normal and symmetric.  Psychiatric:        Behavior: Behavior normal.        Thought Content: Thought content normal.        Judgment: Judgment normal.       BP 125/71   Pulse 66   Temp 97.9 F (36.6 C) (Temporal)   Ht 6\' 3"  (1.905 m)   Wt 210 lb 12.8  oz (95.6 kg)   SpO2 97%   BMI 26.35 kg/m      Assessment & Plan:  [name]. comes in today with chief complaint of Medical Management of Chronic Issues   Diagnosis and orders addressed:  1. Acute recurrent maxillary sinusitis - fluticasone (FLONASE) 50 MCG/ACT nasal spray; Place 2 sprays into both nostrils daily.  Dispense: 16 g; Refill: 6 - CBC with Differential/Platelet - CMP14+EGFR  2. Mixed hyperlipidemia - icosapent Ethyl (VASCEPA) 1 g capsule; Take 2 capsules (2 g total) by mouth 2 (two) times daily.  Dispense: 120 capsule; Refill: 5 - Bempedoic Acid-Ezetimibe (NEXLIZET) 180-10 MG TABS; TAKE ONE (1) TABLET BY MOUTH EVERY DAY  Dispense: 30 tablet; Refill: 0 - fenofibrate (TRICOR) 145 MG tablet; Take 1 tablet (145 mg total) by mouth daily.  Dispense: 30 tablet; Refill: 5 - CBC with Differential/Platelet - CMP14+EGFR - Lipid panel  3. Coronary artery disease involving native coronary artery of native heart with angina pectoris with documented spasm (HCC) - icosapent Ethyl (VASCEPA) 1 g capsule; Take 2 capsules (2 g total) by mouth 2 (two) times daily.  Dispense: 120 capsule; Refill: 5 - CBC with Differential/Platelet - CMP14+EGFR  4. Gastroesophageal reflux disease without esophagitis - omeprazole (PRILOSEC) 20 MG capsule; TAKE 2 CAPSULES BY MOUTH DAILY  Dispense: 180 capsule; Refill: 0 - CBC with Differential/Platelet - CMP14+EGFR  5. Annual physical exam - Bayer DCA Hb A1c Waived - CBC with Differential/Platelet - CMP14+EGFR - Lipid panel - TSH - PSA, total and  free - Microalbumin / creatinine urine ratio  6. Type 2 diabetes mellitus with hyperglycemia, without long-term current use of insulin (HCC) - metFORMIN (GLUCOPHAGE) 500 MG tablet; Take 1 tablet (500 mg total) by mouth 2 (two) times daily with a meal. (NEEDS TO BE SEEN BEFORE NEXT REFILL)  Dispense: 60 tablet; Refill: 0 - Bayer DCA Hb A1c Waived - CBC with Differential/Platelet - CMP14+EGFR - Microalbumin / creatinine urine ratio  7. Obstructive sleep apnea Will call Sleep Med today and make follow up appointment  - CBC with Differential/Platelet - CMP14+EGFR  8. Myalgia due to statin - CBC with Differential/Platelet - CMP14+EGFR  9. Acquired hypothyroidism - CBC with Differential/Platelet - CMP14+EGFR  10. Aortic atherosclerosis (HCC) - CBC with Differential/Platelet - CMP14+EGFR   Labs pending Pt will call Sleep Med and make follow up Health Maintenance reviewed Diet and exercise encouraged  Follow up plan: 6 months    Jannifer Rodney, FNP

## 2022-09-13 ENCOUNTER — Other Ambulatory Visit: Payer: Self-pay | Admitting: Family

## 2022-09-15 ENCOUNTER — Other Ambulatory Visit: Payer: Self-pay | Admitting: Family

## 2022-09-15 DIAGNOSIS — I2510 Atherosclerotic heart disease of native coronary artery without angina pectoris: Secondary | ICD-10-CM

## 2022-10-07 DIAGNOSIS — H04123 Dry eye syndrome of bilateral lacrimal glands: Secondary | ICD-10-CM | POA: Diagnosis not present

## 2022-10-07 DIAGNOSIS — E119 Type 2 diabetes mellitus without complications: Secondary | ICD-10-CM | POA: Diagnosis not present

## 2022-10-07 DIAGNOSIS — H40021 Open angle with borderline findings, high risk, right eye: Secondary | ICD-10-CM | POA: Diagnosis not present

## 2022-10-07 DIAGNOSIS — H35373 Puckering of macula, bilateral: Secondary | ICD-10-CM | POA: Diagnosis not present

## 2022-10-07 DIAGNOSIS — H26493 Other secondary cataract, bilateral: Secondary | ICD-10-CM | POA: Diagnosis not present

## 2022-10-16 ENCOUNTER — Encounter: Payer: Self-pay | Admitting: Primary Care

## 2022-10-16 ENCOUNTER — Ambulatory Visit: Payer: Medicare HMO | Admitting: Primary Care

## 2022-10-16 VITALS — BP 134/64 | HR 75 | Temp 98.8°F | Ht 75.0 in | Wt 212.4 lb

## 2022-10-16 DIAGNOSIS — G4733 Obstructive sleep apnea (adult) (pediatric): Secondary | ICD-10-CM

## 2022-10-16 NOTE — Patient Instructions (Signed)
Sleep study in June showed severe obstructive sleep apnea, you had on average 37 apneas per hour  Recommend that you be restarted on CPAP auto settings 5 to 20 cm H2O with mask of choice  Aim to wear CPAP nightly for minimum 4 hours or longer  If having issues tolerating notify office sooner   Follow-up 8 to 12 weeks with Beth NP for CPAP compliance check   CPAP and BIPAP Information CPAP and BIPAP are methods that use air pressure to keep your airways open and to help you breathe well. CPAP and BIPAP use different amounts of pressure. Your health care provider will tell you whether CPAP or BIPAP would be more helpful for you. CPAP stands for "continuous positive airway pressure." With CPAP, the amount of pressure stays the same while you breathe in (inhale) and out (exhale). BIPAP stands for "bi-level positive airway pressure." With BIPAP, the amount of pressure will be higher when you inhale and lower when you exhale. This allows you to take larger breaths. CPAP or BIPAP may be used in the hospital, or your health care provider may want you to use it at home. You may need to have a sleep study before your health care provider can order a machine for you to use at home. What are the advantages? CPAP or BIPAP can be helpful if you have: Sleep apnea. Chronic obstructive pulmonary disease (COPD). Heart failure. Medical conditions that cause muscle weakness, including muscular dystrophy or amyotrophic lateral sclerosis (ALS). Other problems that cause breathing to be shallow, weak, abnormal, or difficult. CPAP and BIPAP are most commonly used for obstructive sleep apnea (OSA) to keep the airways from collapsing when the muscles relax during sleep. What are the risks? Generally, this is a safe treatment. However, problems may occur, including: Irritated skin or skin sores if the mask does not fit properly. Dry or stuffy nose or nosebleeds. Dry mouth. Feeling gassy or bloated. Sinus or lung  infection if the equipment is not cleaned properly. When should CPAP or BIPAP be used? In most cases, the mask only needs to be worn during sleep. Generally, the mask needs to be worn throughout the night and during any daytime naps. People with certain medical conditions may also need to wear the mask at other times, such as when they are awake. Follow instructions from your health care provider about when to use the machine. What happens during CPAP or BIPAP?  Both CPAP and BIPAP are provided by a small machine with a flexible plastic tube that attaches to a plastic mask that you wear. Air is blown through the mask into your nose or mouth. The amount of pressure that is used to blow the air can be adjusted on the machine. Your health care provider will set the pressure setting and help you find the best mask for you. Tips for using the mask Because the mask needs to be snug, some people feel trapped or closed-in (claustrophobic) when first using the mask. If you feel this way, you may need to get used to the mask. One way to do this is to hold the mask loosely over your nose or mouth and then gradually apply the mask more snugly. You can also gradually increase the amount of time that you use the mask. Masks are available in various types and sizes. If your mask does not fit well, talk with your health care provider about getting a different one. Some common types of masks include: Full face masks,  which fit over the mouth and nose. Nasal masks, which fit over the nose. Nasal pillow or prong masks, which fit into the nostrils. If you are using a mask that fits over your nose and you tend to breathe through your mouth, a chin strap may be applied to help keep your mouth closed. Use a skin barrier to protect your skin as told by your health care provider. Some CPAP and BIPAP machines have alarms that may sound if the mask comes off or develops a leak. If you have trouble with the mask, it is very  important that you talk with your health care provider about finding a way to make the mask easier to tolerate. Do not stop using the mask. There could be a negative impact on your health if you stop using the mask. Tips for using the machine Place your CPAP or BIPAP machine on a secure table or stand near an electrical outlet. Know where the on/off switch is on the machine. Follow instructions from your health care provider about how to set the pressure on your machine and when you should use it. Do not eat or drink while the CPAP or BIPAP machine is on. Food or fluids could get pushed into your lungs by the pressure of the CPAP or BIPAP. For home use, CPAP and BIPAP machines can be rented or purchased through home health care companies. Many different brands of machines are available. Renting a machine before purchasing may help you find out which particular machine works well for you. Your health insurance company may also decide which machine you may get. Keep the CPAP or BIPAP machine and attachments clean. Ask your health care provider for specific instructions. Check the humidifier if you have a dry stuffy nose or nosebleeds. Make sure it is working correctly. Follow these instructions at home: Take over-the-counter and prescription medicines only as told by your health care provider. Ask if you can take sinus medicine if your sinuses are blocked. Do not use any products that contain nicotine or tobacco. These products include cigarettes, chewing tobacco, and vaping devices, such as e-cigarettes. If you need help quitting, ask your health care provider. Keep all follow-up visits. This is important. Contact a health care provider if: You have redness or pressure sores on your head, face, mouth, or nose from the mask or head gear. You have trouble using the CPAP or BIPAP machine. You cannot tolerate wearing the CPAP or BIPAP mask. Someone tells you that you snore even when wearing your CPAP or  BIPAP. Get help right away if: You have trouble breathing. You feel confused. Summary CPAP and BIPAP are methods that use air pressure to keep your airways open and to help you breathe well. If you have trouble with the mask, it is very important that you talk with your health care provider about finding a way to make the mask easier to tolerate. Do not stop using the mask. There could be a negative impact to your health if you stop using the mask. Follow instructions from your health care provider about when to use the machine. This information is not intended to replace advice given to you by your health care provider. Make sure you discuss any questions you have with your health care provider. Document Revised: 09/05/2020 Document Reviewed: 01/06/2020 Elsevier Patient Education  2023 [employer].

## 2022-10-16 NOTE — Progress Notes (Signed)
@Patient  ID: Corey Oaks., male    DOB: [birth year], 72 y.o.   MRN: [id number]  Chief Complaint  Patient presents with   Follow-up    F/u after Split night sleep study    Referring provider: [name], FNP  HPI: 72 year old male, former smoker. PMH significant for hypothyroidism, hyperlipidemia, CAD and GERD. Patient of Dr. [name].   Previous [facility] pulmonary encounter  05/20/2022 Patient presents today for overdue follow-up OSA. HST 10/04/2017 showed severe OSA, AHI 50/hour with SpO2 low 66%. He stopped consistently wearing CPAP a couple years back, he had some difficulty tolerating but is open to resuming. CPAP machine is 89-61 years old. He is not currently established with DME company to receive CPAP supplies. He recently purchased new tubing for CPAP out of pocket. Previous DME was [facility].   Obstructive sleep apnea - Sleep study in 2019 showed severe obstructive sleep apnea, AHI 50/hour apneic events an hour with a low oxygen level of 66%.  Patient was previously on CPAP but stopped wearing several years ago.  He is open to resuming use.  Unclear if current machine is in working order, he is likely due for replacement in the next month or two.  Reviewed risks of untreated sleep apnea including cardiac arrhythmias, stroke, pulmonary hypertension and diabetes. Due to break in therapy we will need to repeat sleep study, recommending getting in lab study with titration study to determine correct pressure settings and supplemental oxygen need.  After completing sleep study we can place orders for either new CPAP machine and/or CPAP supplies.    Orders: Split night sleep study    Follow-up: 2-3 months with Beth NP for CPAP follow-up   10/16/2022- Interim hx  Patient presents today for OSA follow-up. HST 10/04/2017 showed severe OSA, AHI 50/hour with SpO2 low 66%. He stopped consistently wearing CPAP a couple years back, he had some difficulty tolerating but is open to resuming.   He  has been told that he snores. Sleep is restless. Associated daytime fatigue. NSPG 08/01/22 >> AHI 37.1. Reviewed sleep study results and treatment options. Patient is open to resuming auto CPAP   Allergies  Allergen Reactions   Imdur [Isosorbide Nitrate]     Headache    Lipitor [Atorvastatin] Other (See Comments)    myalgias    Immunization History  Administered Date(s) Administered   Influenza,inj,Quad PF,6+ Mos 01/20/2022   Tdap 12/08/2007    Past Medical History:  Diagnosis Date   Angina at rest    Benign neoplasm of colon    CAD (coronary artery disease)     Occluded RCA, 50% Diat 2006, 2008   Diverticulosis of colon (without mention of hemorrhage)    Esophageal reflux    Other and unspecified hyperlipidemia    Unspecified disorder of thyroid     Tobacco History: Social History   Tobacco Use  Smoking Status Former   Current packs/day: 0.00   Types: Cigarettes   Quit date: 07/22/1986   Years since quitting: 36.3  Smokeless Tobacco Never   Counseling given: Not Answered   Outpatient Medications Prior to Visit  Medication Sig Dispense Refill   aspirin 81 MG EC tablet Take 81 mg by mouth daily.     fenofibrate (TRICOR) 145 MG tablet Take 1 tablet (145 mg total) by mouth daily. 30 tablet 5   fluticasone (FLONASE) 50 MCG/ACT nasal spray Place 2 sprays into both nostrils daily. 16 g 6   icosapent Ethyl (VASCEPA) 1 g  capsule Take 2 capsules (2 g total) by mouth 2 (two) times daily. 120 capsule 5   levothyroxine (SYNTHROID) 112 MCG tablet TAKE ONE (1) TABLET BY MOUTH EVERY DAY 30 tablet 11   metFORMIN (GLUCOPHAGE) 500 MG tablet Take 1 tablet (500 mg total) by mouth 2 (two) times daily with a meal. (NEEDS TO BE SEEN BEFORE NEXT REFILL) 60 tablet 0   omeprazole (PRILOSEC) 20 MG capsule TAKE 2 CAPSULES BY MOUTH DAILY 180 capsule 0   ranolazine (RANEXA) 500 MG 12 hr tablet TAKE ONE (1) TABLET BY MOUTH TWO (2) TIMES DAILY 60 tablet 5   Vitamin D, Ergocalciferol, (DRISDOL)  1.25 MG (50000 UNIT) CAPS capsule TAKE 1 CAPSULE BY MOUTH EVERY 7 DAYS 12 capsule 1   Bempedoic Acid-Ezetimibe (NEXLIZET) 180-10 MG TABS TAKE ONE (1) TABLET BY MOUTH EVERY DAY 30 tablet 0   No facility-administered medications prior to visit.      Review of Systems  Review of Systems  Constitutional:  Positive for fatigue.  Respiratory: Negative.      Physical Exam  BP 134/64 (BP Location: Left Arm, Cuff Size: Large)   Pulse 75   Temp 98.8 F (37.1 C) (Temporal)   Ht 6\' 3"  (1.905 m)   Wt 212 lb 6.4 oz (96.3 kg)   SpO2 98%   BMI 26.55 kg/m  Physical Exam Constitutional:      Appearance: Normal appearance.  HENT:     Head: Normocephalic and atraumatic.  Cardiovascular:     Rate and Rhythm: Normal rate and regular rhythm.  Pulmonary:     Breath sounds: Normal breath sounds.  Neurological:     General: No focal deficit present.     Mental Status: He is alert and oriented to person, place, and time. Mental status is at baseline.      Lab Results:  CBC    Component Value Date/Time   WBC 5.0 09/12/2022 1243   WBC 6.1 06/08/2014 1710   WBC 5.2 01/18/2007 1053   RBC 4.20 09/12/2022 1243   RBC 5.00 06/08/2014 1710   RBC 4.43 01/18/2007 1053   HGB 12.5 (L) 09/12/2022 1243   HCT 37.1 (L) 09/12/2022 1243   PLT 284 09/12/2022 1243   MCV 88 09/12/2022 1243   MCH 29.8 09/12/2022 1243   MCH 27.2 06/08/2014 1710   MCHC 33.7 09/12/2022 1243   MCHC 31.3 (A) 06/08/2014 1710   MCHC 34.6 01/18/2007 1053   RDW 14.2 09/12/2022 1243   LYMPHSABS 2.1 09/12/2022 1243   MONOABS 0.4 01/18/2007 1053   EOSABS 0.2 09/12/2022 1243   BASOSABS 0.1 09/12/2022 1243    BMET    Component Value Date/Time   NA 136 09/12/2022 1243   K 4.5 09/12/2022 1243   CL 98 09/12/2022 1243   CO2 20 09/12/2022 1243   GLUCOSE 98 09/12/2022 1243   GLUCOSE 101 (H) 10/15/2012 0854   BUN 19 09/12/2022 1243   CREATININE 1.22 09/12/2022 1243   CREATININE 1.40 (H) 10/15/2012 0854   CALCIUM 9.3  09/12/2022 1243   GFRNONAA 91 09/27/2019 1648   GFRNONAA 53 (L) 10/15/2012 0854   GFRAA 105 09/27/2019 1648   GFRAA 61 10/15/2012 0854    BNP No results found for: "BNP"  ProBNP No results found for: "PROBNP"  Imaging: No results found.   Assessment & Plan:   Obstructive sleep apnea - Hx severe sleep apnea. HST 10/04/2017 >> AHI 50/hour with SpO2 low 66%. He stopped consistently wearing CPAP a couple years back, he  had some difficulty tolerating but is open to resuming. He diagnostic NPSG study on 08/01/2022 which showed evidence of severe obstructive sleep apnea, AHI 36.7/h.   Sleep study in June showed severe obstructive sleep apnea, you had on average 37 apneas per hour  Recommend that you be restarted on CPAP auto settings 5 to 20 cm H2O with mask of choice  Aim to wear CPAP nightly for minimum 4 hours or longer  If having issues tolerating notify office sooner   Follow-up 8 to 12 weeks with Beth NP for CPAP compliance check  Glenford Bayley, NP 11/10/2022

## 2022-10-16 NOTE — Assessment & Plan Note (Signed)
-   Hx severe sleep apnea. HST 10/04/2017 >> AHI 50/hour with SpO2 low 66%. He stopped consistently wearing CPAP a couple years back, he had some difficulty tolerating but is open to resuming. He diagnostic NPSG study on 08/01/2022 which showed evidence of severe obstructive sleep apnea, AHI 36.7/h.

## 2022-10-17 ENCOUNTER — Ambulatory Visit (INDEPENDENT_AMBULATORY_CARE_PROVIDER_SITE_OTHER): Payer: Medicare HMO | Admitting: Family

## 2022-10-17 ENCOUNTER — Telehealth: Payer: Self-pay | Admitting: Family Medicine

## 2022-10-17 ENCOUNTER — Encounter: Payer: Self-pay | Admitting: Family

## 2022-10-17 VITALS — BP 134/75 | HR 76 | Temp 97.9°F | Ht 75.0 in | Wt 209.8 lb

## 2022-10-17 DIAGNOSIS — R6889 Other general symptoms and signs: Secondary | ICD-10-CM | POA: Diagnosis not present

## 2022-10-17 DIAGNOSIS — U071 COVID-19: Secondary | ICD-10-CM

## 2022-10-17 MED ORDER — CETIRIZINE HCL 10 MG PO TABS
10.0000 mg | ORAL_TABLET | Freq: Every day | ORAL | 1 refills | Status: AC
Start: 2022-10-17 — End: ?

## 2022-10-17 MED ORDER — NIRMATRELVIR/RITONAVIR (PAXLOVID)TABLET
3.0000 | ORAL_TABLET | Freq: Two times a day (BID) | ORAL | 0 refills | Status: AC
Start: 2022-10-17 — End: 2022-10-22

## 2022-10-17 NOTE — Progress Notes (Signed)
Subjective:    Patient ID: Corey Oaks., male    DOB: March 20, 1950, 72 y.o.   MRN: [id number]  Chief Complaint  Patient presents with   Cough   Pt presents to the office today with a cough that started yesterday.  Cough This is a new problem. The current episode started yesterday. The problem has been waxing and waning. The problem occurs every few minutes. The cough is Productive of sputum. Associated symptoms include myalgias, nasal congestion, postnasal drip, a sore throat and shortness of breath. Pertinent negatives include no ear congestion, ear pain, fever, headaches or wheezing. Associated symptoms comments: Hoarse voice. He has tried rest for the symptoms. The treatment provided mild relief.      Review of Systems  Constitutional:  Negative for fever.  HENT:  Positive for postnasal drip and sore throat. Negative for ear pain.   Respiratory:  Positive for cough and shortness of breath. Negative for wheezing.   Musculoskeletal:  Positive for myalgias.  Neurological:  Negative for headaches.  All other systems reviewed and are negative.      Objective:   Physical Exam Vitals reviewed.  Constitutional:      General: He is not in acute distress.    Appearance: He is well-developed.  HENT:     Head: Normocephalic.     Right Ear: Tympanic membrane normal.     Left Ear: Tympanic membrane is erythematous.     Ears:     Comments: Nasal congestion  Eyes:     General:        Right eye: No discharge.        Left eye: No discharge.     Pupils: Pupils are equal, round, and reactive to light.  Neck:     Thyroid: No thyromegaly.  Cardiovascular:     Rate and Rhythm: Normal rate and regular rhythm.     Heart sounds: Normal heart sounds. No murmur heard. Pulmonary:     Effort: Pulmonary effort is normal. No respiratory distress.     Breath sounds: Wheezing and rhonchi present.  Abdominal:     General: Bowel sounds are normal. There is no distension.     Palpations:  Abdomen is soft.     Tenderness: There is no abdominal tenderness.  Musculoskeletal:        General: No tenderness. Normal range of motion.     Cervical back: Normal range of motion and neck supple.  Skin:    General: Skin is warm and dry.     Findings: No erythema or rash.  Neurological:     Mental Status: He is alert and oriented to person, place, and time.     Cranial Nerves: No cranial nerve deficit.     Deep Tendon Reflexes: Reflexes are normal and symmetric.  Psychiatric:        Behavior: Behavior normal.        Thought Content: Thought content normal.        Judgment: Judgment normal.      BP 134/75   Pulse 76   Temp 97.9 F (36.6 C) (Temporal)   Ht 6\' 3"  (1.905 m)   Wt 209 lb 12.8 oz (95.2 kg)   SpO2 96%   BMI 26.22 kg/m      Assessment & Plan:  Corey Oaks. comes in today with chief complaint of Cough   Diagnosis and orders addressed:  1. Flu-like symptoms Pt will go take COVID test today and let us know  If positive will send in antivirals, if negative will send in prednisone  - Take meds as prescribed - Use a cool mist humidifier  -Use saline nose sprays frequently -Force fluids -For any cough or congestion  Use plain Mucinex- regular strength or max strength is fine -For fever or aces or pains- take tylenol or ibuprofen. -Throat lozenges if help -Start zyrtec and flonase  - cetirizine (ZYRTEC ALLERGY) 10 MG tablet; Take 1 tablet (10 mg total) by mouth daily.  Dispense: 90 tablet; Refill: 1 - COVID-19, Flu A+B and RSV   Jannifer Rodney, FNP

## 2022-10-17 NOTE — Patient Instructions (Signed)

## 2022-10-17 NOTE — Telephone Encounter (Signed)
Paxlovid Prescription sent to pharmacy. COVID positive, rest, force fluids, tylenol as needed,  report any worsening symptoms such as increased shortness of breath, swelling, or continued high fevers.

## 2022-10-17 NOTE — Telephone Encounter (Signed)
Patient aware and verbalized understanding. °

## 2022-10-18 LAB — COVID-19, FLU A+B AND RSV
Influenza A, NAA: NOT DETECTED
Influenza B, NAA: NOT DETECTED
RSV, NAA: NOT DETECTED
SARS-CoV-2, NAA: DETECTED — AB

## 2022-10-31 ENCOUNTER — Ambulatory Visit (INDEPENDENT_AMBULATORY_CARE_PROVIDER_SITE_OTHER): Payer: Medicare HMO

## 2022-10-31 VITALS — Ht 75.0 in | Wt 200.0 lb

## 2022-10-31 DIAGNOSIS — Z0000 Encounter for general adult medical examination without abnormal findings: Secondary | ICD-10-CM | POA: Diagnosis not present

## 2022-10-31 DIAGNOSIS — Z1211 Encounter for screening for malignant neoplasm of colon: Secondary | ICD-10-CM

## 2022-10-31 NOTE — Progress Notes (Signed)
Subjective:   [name]. is a 72 y.o. male who presents for Medicare Annual/Subsequent preventive examination.  Visit Complete: Virtual  I connected with  Estevan Oaks. on 10/31/22 by a audio enabled telemedicine application and verified that I am speaking with the correct person using two identifiers.  Patient Location: Home  Provider Location: Home Office  I discussed the limitations of evaluation and management by telemedicine. The patient expressed understanding and agreed to proceed.  Patient Medicare AWV questionnaire was completed by the patient on 10/31/2022; I have confirmed that all information answered by patient is correct and no changes since this date.  Cardiac Risk Factors include: advanced age (>58men, >27 women);diabetes mellitus;dyslipidemia;hypertension;male genderVital Signs: Unable to obtain new vitals due to this being a telehealth visit.      Objective:    Today's Vitals   10/31/22 1453  Weight: 200 lb (90.7 kg)  Height: 6\' 3"  (1.905 m)   Body mass index is 25 kg/m.     10/31/2022    3:00 PM 08/01/2022   10:00 PM 02/19/2022    1:00 PM 10/16/2021    2:48 PM 10/12/2020    3:49 PM 10/07/2019   10:47 AM 10/04/2018    2:38 PM  Advanced Directives  Does Patient Have a Medical Advance Directive? Yes No No Yes No;Yes Yes No  Type of Estate agent of [place];Living will   Healthcare Power of [employer] of [place];Living will;Out of facility DNR (pink MOST or yellow form)   Does patient want to make changes to medical advance directive?    No - Patient declined     Copy of Healthcare Power of Attorney in Chart? No - copy requested   No - copy requested  No - copy requested   Would patient like information on creating a medical advance directive?  No - Patient declined Yes (MAU/Ambulatory/Procedural Areas - Information given)    No - Patient declined    Current Medications (verified) Outpatient Encounter  Medications as of 10/31/2022  Medication Sig   aspirin 81 MG EC tablet Take 81 mg by mouth daily.   Bempedoic Acid-Ezetimibe (NEXLIZET) 180-10 MG TABS TAKE ONE (1) TABLET BY MOUTH EVERY DAY   cetirizine (ZYRTEC ALLERGY) 10 MG tablet Take 1 tablet (10 mg total) by mouth daily.   fenofibrate (TRICOR) 145 MG tablet Take 1 tablet (145 mg total) by mouth daily.   fluticasone (FLONASE) 50 MCG/ACT nasal spray Place 2 sprays into both nostrils daily.   icosapent Ethyl (VASCEPA) 1 g capsule Take 2 capsules (2 g total) by mouth 2 (two) times daily.   levothyroxine (SYNTHROID) 112 MCG tablet TAKE ONE (1) TABLET BY MOUTH EVERY DAY   metFORMIN (GLUCOPHAGE) 500 MG tablet Take 1 tablet (500 mg total) by mouth 2 (two) times daily with a meal. (NEEDS TO BE SEEN BEFORE NEXT REFILL)   omeprazole (PRILOSEC) 20 MG capsule TAKE 2 CAPSULES BY MOUTH DAILY   ranolazine (RANEXA) 500 MG 12 hr tablet TAKE ONE (1) TABLET BY MOUTH TWO (2) TIMES DAILY   Vitamin D, Ergocalciferol, (DRISDOL) 1.25 MG (50000 UNIT) CAPS capsule TAKE 1 CAPSULE BY MOUTH EVERY 7 DAYS   No facility-administered encounter medications on file as of 10/31/2022.    Allergies (verified) Imdur [isosorbide nitrate] and Lipitor [atorvastatin]   History: Past Medical History:  Diagnosis Date   Angina at rest    Benign neoplasm of colon    CAD (coronary artery disease)  Occluded RCA, 50% Diat 2006, 2008   Diverticulosis of colon (without mention of hemorrhage)    Esophageal reflux    Other and unspecified hyperlipidemia    Unspecified disorder of thyroid    Past Surgical History:  Procedure Laterality Date   TONSILLECTOMY  1955   Family History  Problem Relation Age of Onset   Hyperlipidemia Father    Heart disease Father        pacemaker / CHF    Dementia Maternal Grandmother    Cancer Maternal Grandfather        throat   Alzheimer's disease Paternal Grandmother    Cancer Paternal Grandfather        lung   Cancer Sister         breast   Colon cancer Neg Hx    Social History   Socioeconomic History   Marital status: Widowed    Spouse name: Not on file   Number of children: 3   Years of education: 16   Highest education level: Bachelor's degree (e.g., BA, AB, BS)  Occupational History   Occupation: retired     Comment: golf / club prefessional   Tobacco Use   Smoking status: Former    Current packs/day: 0.00    Types: Cigarettes    Quit date: 07/22/1986    Years since quitting: 36.3   Smokeless tobacco: Never  Vaping Use   Vaping status: Never Used  Substance and Sexual Activity   Alcohol use: Not Currently   Drug use: No   Sexual activity: Not Currently  Other Topics Concern   Not on file  Social History Narrative   Retired, widowed, lives alone. 3 grown children. Enjoys golf.    Social Determinants of Health   Financial Resource Strain: Patient Unable To Answer (10/31/2022)   Overall Financial Resource Strain (CARDIA)    Difficulty of Paying Living Expenses: Patient unable to answer  Food Insecurity: No Food Insecurity (10/31/2022)   Hunger Vital Sign    Worried About Running Out of Food in the Last Year: Never true    Ran Out of Food in the Last Year: Never true  Transportation Needs: No Transportation Needs (10/31/2022)   PRAPARE - Administrator, Civil Service (Medical): No    Lack of Transportation (Non-Medical): No  Physical Activity: Sufficiently Active (10/31/2022)   Exercise Vital Sign    Days of Exercise per Week: 7 days    Minutes of Exercise per Session: 30 min  Stress: No Stress Concern Present (10/31/2022)   [employer] of Occupational Health - Occupational Stress Questionnaire    Feeling of Stress : Not at all  Social Connections: Moderately Isolated (10/31/2022)   Social Connection and Isolation Panel [NHANES]    Frequency of Communication with Friends and Family: More than three times a week    Frequency of Social Gatherings with Friends and Family: More than  three times a week    Attends Religious Services: More than 4 times per year    Active Member of [employer] or Organizations: No    Attends Banker Meetings: Never    Marital Status: Widowed    Tobacco Counseling Counseling given: Not Answered   Clinical Intake:  Pre-visit preparation completed: Yes  Pain : No/denies pain     Nutritional Risks: None Diabetes: Yes CBG done?: No Did pt. bring in CBG monitor from home?: No  How often do you need to have someone help you when you read instructions, pamphlets,  or other written materials from your doctor or pharmacy?: 1 - Never  Interpreter Needed?: No  Information entered by :: Renie Ora, LPN   Activities of Daily Living    10/31/2022    3:00 PM  In your present state of health, do you have any difficulty performing the following activities:  Hearing? 0  Vision? 0  Difficulty concentrating or making decisions? 0  Walking or climbing stairs? 0  Dressing or bathing? 0  Doing errands, shopping? 0  Preparing Food and eating ? N  Using the Toilet? N  In the past six months, have you accidently leaked urine? N  Do you have problems with loss of bowel control? N  Managing your Medications? N  Managing your Finances? N  Housekeeping or managing your Housekeeping? N    Patient Care Team: [name], FNP as PCP - General (Family Medicine) Pyrtle, Carie Caddy, MD as Consulting Physician (Gastroenterology) Tomma Lightning, MD as Consulting Physician (Pulmonary Disease) Rollene Rotunda, MD as Consulting Physician (Cardiology) Danella Maiers, [facility] as Pharmacist (Family Medicine) Nelson Chimes, MD as Attending Physician (Ophthalmology)  Indicate any recent Medical Services you may have received from other than [facility] providers in the past year (date may be approximate).     Assessment:   This is a routine wellness examination for [facility].  Hearing/Vision screen Vision Screening - Comments:: Wears rx glasses -  up to date with routine [facility] exams with  [facility]    Goals Addressed             This Visit's Progress    DIET - INCREASE WATER INTAKE   On track    Try to drink 6-8 glasses of water daily.       Depression Screen    10/31/2022    2:58 PM 10/17/2022    8:38 AM 02/19/2022   12:59 PM 01/20/2022   11:09 AM 09/17/2021   11:23 AM 12/24/2020    2:36 PM 10/12/2020    3:39 PM  PHQ 2/9 Scores  PHQ - 2 Score 0 0 0 0 0 0 0  PHQ- 9 Score 0 0  0 0      Fall Risk    10/31/2022    2:54 PM 02/19/2022   12:39 PM 01/20/2022   11:08 AM 09/17/2021   11:23 AM 12/24/2020    2:36 PM  Fall Risk   Falls in the past year? 0 0 0 0 0  Number falls in past yr: 0      Injury with Fall? 0      Risk for fall due to : No Fall Risks      Follow up Falls prevention discussed        MEDICARE RISK AT HOME: Medicare Risk at Home Any stairs in or around the home?: Yes If so, are there any without handrails?: No Home free of loose throw rugs in walkways, pet beds, electrical cords, etc?: Yes Adequate lighting in your home to reduce risk of falls?: Yes Life alert?: No Use of a cane, walker or w/c?: No Grab bars in the bathroom?: Yes Shower chair or bench in shower?: Yes Elevated toilet seat or a handicapped toilet?: Yes  TIMED UP AND GO:  Was the test performed?  No    Cognitive Function:    09/28/2017   11:33 AM  MMSE - Mini Mental State Exam  Orientation to time 5  Orientation to Place 5  Registration 3  Attention/ Calculation 5  Recall 3  Language- name 2 objects 2  Language- repeat 1  Language- follow 3 step command 3  Language- read & follow direction 1  Write a sentence 1  Copy design 1  Total score 30        10/31/2022    3:00 PM 10/16/2021    2:51 PM 10/07/2019   10:50 AM 10/04/2018    2:40 PM  6CIT Screen  What Year? 0 points 0 points 0 points 0 points  What month? 0 points 0 points 0 points 0 points  What time? 0 points 0 points 0 points 0 points  Count back from 20 0  points 0 points 0 points 0 points  Months in reverse 0 points 0 points 0 points 0 points  Repeat phrase 0 points 0 points 0 points 0 points  Total Score 0 points 0 points 0 points 0 points    Immunizations Immunization History  Administered Date(s) Administered   Influenza,inj,Quad PF,6+ Mos 01/20/2022   Tdap 12/08/2007    TDAP status: Due, Education has been provided regarding the importance of this vaccine. Advised may receive this vaccine at local pharmacy or Health Dept. Aware to provide a copy of the vaccination record if obtained from local pharmacy or Health Dept. Verbalized acceptance and understanding.  Flu Vaccine status: Due, Education has been provided regarding the importance of this vaccine. Advised may receive this vaccine at local pharmacy or Health Dept. Aware to provide a copy of the vaccination record if obtained from local pharmacy or Health Dept. Verbalized acceptance and understanding.  Pneumococcal vaccine status: Due, Education has been provided regarding the importance of this vaccine. Advised may receive this vaccine at local pharmacy or Health Dept. Aware to provide a copy of the vaccination record if obtained from local pharmacy or Health Dept. Verbalized acceptance and understanding.  Covid-19 vaccine status: Declined, Education has been provided regarding the importance of this vaccine but patient still declined. Advised may receive this vaccine at local pharmacy or Health Dept.or vaccine clinic. Aware to provide a copy of the vaccination record if obtained from local pharmacy or Health Dept. Verbalized acceptance and understanding.  Qualifies for Shingles Vaccine? Yes   Zostavax completed No   Shingrix Completed?: No.    Education has been provided regarding the importance of this vaccine. Patient has been advised to call insurance company to determine out of pocket expense if they have not yet received this vaccine. Advised may also receive vaccine at local  pharmacy or Health Dept. Verbalized acceptance and understanding.  Screening Tests Health Maintenance  Topic Date Due   OPHTHALMOLOGY EXAM  Never done   DTaP/Tdap/Td (2 - Td or Tdap) 12/07/2017   FOOT EXAM  09/18/2022   COVID-19 Vaccine (1 - 2023-24 season) Never done   Zoster Vaccines- Shingrix (1 of 2) 12/13/2022 (Originally 01/11/1999)   Pneumonia Vaccine 32+ Years old (1 of 1 - PCV) 01/21/2023 (Originally 01/10/2014)   Colonoscopy  01/21/2023 (Originally 02/15/2017)   INFLUENZA VACCINE  05/11/2023 (Originally 09/11/2022)   HEMOGLOBIN A1C  03/15/2023   Diabetic kidney evaluation - eGFR measurement  09/12/2023   Diabetic kidney evaluation - Urine ACR  09/12/2023   Medicare Annual Wellness (AWV)  10/31/2023   Hepatitis C Screening  Completed   HPV VACCINES  Aged Out   COLON CANCER SCREENING ANNUAL FOBT  Discontinued    Health Maintenance  Health Maintenance Due  Topic Date Due   OPHTHALMOLOGY EXAM  Never done   DTaP/Tdap/Td (2 - Td  or Tdap) 12/07/2017   FOOT EXAM  09/18/2022   COVID-19 Vaccine (1 - 2023-24 season) Never done    Colorectal cancer screening: Referral to GI placed 10/31/2022. Pt aware the office will call re: appt.  Lung Cancer Screening: (Low Dose CT Chest recommended if Age 25-80 years, 20 pack-year currently smoking OR have quit w/in 15years.) does not qualify.   Lung Cancer Screening Referral: n/a  Additional Screening:  Hepatitis C Screening: does not qualify; Completed 06/14/2020  Vision Screening: Recommended annual ophthalmology exams for early detection of glaucoma and other disorders of the eye. Is the patient up to date with their annual eye exam?  Yes  Who is the provider or what is the name of the office in which the patient attends annual eye exams? [facility]  If pt is not established with a provider, would they like to be referred to a provider to establish care? No .   Dental Screening: Recommended annual dental exams for proper oral  hygiene   Community Resource Referral / Chronic Care Management: CRR required this visit?  No   CCM required this visit?  No     Plan:     I have personally reviewed and noted the following in the patient's chart:   Medical and social history Use of alcohol, tobacco or illicit drugs  Current medications and supplements including opioid prescriptions. Patient is not currently taking opioid prescriptions. Functional ability and status Nutritional status Physical activity Advanced directives List of other physicians Hospitalizations, surgeries, and ER visits in previous 12 months Vitals Screenings to include cognitive, depression, and falls Referrals and appointments  In addition, I have reviewed and discussed with patient certain preventive protocols, quality metrics, and best practice recommendations. A written personalized care plan for preventive services as well as general preventive health recommendations were provided to patient.     Lorrene Reid, LPN   02/29/1476   After Visit Summary: (MyChart) Due to this being a telephonic visit, the after visit summary with patients personalized plan was offered to patient via MyChart   Nurse Notes: none

## 2022-10-31 NOTE — Patient Instructions (Signed)
Mr. Dewater , Thank you for taking time to come for your Medicare Wellness Visit. I appreciate your ongoing commitment to your health goals. Please review the following plan we discussed and let me know if I can assist you in the future.   Referrals/Orders/Follow-Ups/Clinician Recommendations: Aim for 30 minutes of exercise or brisk walking, 6-8 glasses of water, and 5 servings of fruits and vegetables each day.   This is a list of the screening recommended for you and due dates:  Health Maintenance  Topic Date Due   Eye exam for diabetics  Never done   DTaP/Tdap/Td vaccine (2 - Td or Tdap) 12/07/2017   Complete foot exam   09/18/2022   COVID-19 Vaccine (1 - 2023-24 season) Never done   Zoster (Shingles) Vaccine (1 of 2) 12/13/2022*   Pneumonia Vaccine (1 of 1 - PCV) 01/21/2023*   Colon Cancer Screening  01/21/2023*   Flu Shot  05/11/2023*   Hemoglobin A1C  03/15/2023   Yearly kidney function blood test for diabetes  09/12/2023   Yearly kidney health urinalysis for diabetes  09/12/2023   Medicare Annual Wellness Visit  10/31/2023   Hepatitis C Screening  Completed   HPV Vaccine  Aged Out   Stool Blood Test  Discontinued  *Topic was postponed. The date shown is not the original due date.    Advanced directives: (Copy Requested) Please bring a copy of your health care power of attorney and living will to the office to be added to your chart at your convenience.  Next Medicare Annual Wellness Visit scheduled for next year: Yes  insert Preventive Care attachment Insert FALL PREVENTION attachment if needed

## 2022-11-01 ENCOUNTER — Other Ambulatory Visit: Payer: Self-pay | Admitting: Family

## 2022-11-01 DIAGNOSIS — E782 Mixed hyperlipidemia: Secondary | ICD-10-CM

## 2022-11-07 ENCOUNTER — Encounter (HOSPITAL_BASED_OUTPATIENT_CLINIC_OR_DEPARTMENT_OTHER): Payer: Self-pay | Admitting: Internal Medicine

## 2022-11-07 ENCOUNTER — Ambulatory Visit (HOSPITAL_BASED_OUTPATIENT_CLINIC_OR_DEPARTMENT_OTHER): Payer: Medicare HMO | Admitting: Internal Medicine

## 2022-11-07 VITALS — BP 128/78 | HR 72 | Ht 75.0 in | Wt 210.0 lb

## 2022-11-07 DIAGNOSIS — T466X5D Adverse effect of antihyperlipidemic and antiarteriosclerotic drugs, subsequent encounter: Secondary | ICD-10-CM | POA: Diagnosis not present

## 2022-11-07 DIAGNOSIS — M791 Myalgia, unspecified site: Secondary | ICD-10-CM | POA: Diagnosis not present

## 2022-11-07 DIAGNOSIS — E781 Pure hyperglyceridemia: Secondary | ICD-10-CM

## 2022-11-07 DIAGNOSIS — I2510 Atherosclerotic heart disease of native coronary artery without angina pectoris: Secondary | ICD-10-CM

## 2022-11-07 DIAGNOSIS — I700 Atherosclerosis of aorta: Secondary | ICD-10-CM | POA: Diagnosis not present

## 2022-11-07 NOTE — Patient Instructions (Signed)
Medication Instructions:  NO CHANGES  *If you need a refill on your cardiac medications before your next appointment, please call your pharmacy*   Lab Work: FASTING lab work in 6 months -- lipid panel, direct LDL  If you have labs (blood work) drawn today and your tests are completely normal, you will receive your results only by: MyChart Message (if you have MyChart) OR A paper copy in the mail If you have any lab test that is abnormal or we need to change your treatment, we will call you to review the results.   Follow-Up: At [facility], you and your health needs are our priority.  As part of our continuing mission to provide you with exceptional heart care, we have created designated Provider Care Teams.  These Care Teams include your primary Cardiologist (physician) and Advanced Practice Providers (APPs -  Physician Assistants and Nurse Practitioners) who all work together to provide you with the care you need, when you need it.  We recommend signing up for the patient portal called "MyChart".  Sign up information is provided on this After Visit Summary.  MyChart is used to connect with patients for Virtual Visits (Telemedicine).  Patients are able to view lab/test results, encounter notes, upcoming appointments, etc.  Non-urgent messages can be sent to your provider as well.   To learn more about what you can do with MyChart, go to [email or web address].    Your next appointment:    6 months with Dr. Rennis Golden -- lipid clinic

## 2022-11-07 NOTE — Progress Notes (Signed)
LIPID CLINIC CONSULT NOTE  Chief Complaint:  Follow-up triglycerides  Primary Care Physician: [name], FNP  Primary Cardiologist:  None  HPI:  [name]. is a 72 y.o. male who is being seen today for the evaluation of high triglycerides at the request of [name], FNP. This is a send 72 year old male kindly referred for evaluation management of high triglycerides.  He reports a longstanding history of this.  Unfortunately he could not tolerate statins.  He had tried Lipitor in the past which caused muscle aches and more recently was on low-dose rosuvastatin but has not been taking it due to similar side effects.  His triglycerides have always run high in the 100s if not thousands.  Recent labs showed total cholesterol of 295, triglycerides 1371 and HDL 15.  LDL was not calculated.  His medication regimen includes fenofibrate 145 mg every day and Nexlizet 180/10 mg every day.  He reports trying to avoid saturated fats in his diet.  He does have known coronary disease including an occluded RCA and additional disease noted by cath.  He has been followed by Dr. Antoine Poche and was last seen in 2021.  05/01/2022  Mr. [name] is seen today in follow-up of his high triglycerides.  Repeat labs demonstrate total cholesterol 286 which is down from 384 about 3 months ago.  Triglycerides 1289, HDL 14 and LDL could not be calculated.  ApoB was obtained and was 175, however this is improved from 213 about 3 months ago.  He is taking  Nexlizet, fenofibrate and Vascepa.  Overall he is tolerating his meds.  He says that he sometimes has trouble remembering to take the Vascepa more than 1 capsule/day.  11/07/2022  Mr. [name] is seen today for follow-up.  Overall he says he is feeling pretty well.  His triglycerides however continue to be poorly controlled.  Most recently his triglycerides were 1766 with total cholesterol 372.  He reports compliance with his medications.  He sounds like  his diet is rather inconsistent.  He may eat something for breakfast but nothing again till later in the day that he may snack some days but not needed others.  This may be causing spikes in insulin and perhaps some lability to his triglycerides.  It is also not clear if his last testing was fasting or not.  He denies any symptoms of pancreatitis.  PMHx:  Past Medical History:  Diagnosis Date   Angina at rest    Benign neoplasm of colon    CAD (coronary artery disease)     Occluded RCA, 50% Diat 2006, 2008   Diverticulosis of colon (without mention of hemorrhage)    Esophageal reflux    Other and unspecified hyperlipidemia    Unspecified disorder of thyroid     Past Surgical History:  Procedure Laterality Date   TONSILLECTOMY  1955    FAMHx:  Family History  Problem Relation Age of Onset   Hyperlipidemia Father    Heart disease Father        pacemaker / CHF    Dementia Maternal Grandmother    Cancer Maternal Grandfather        throat   Alzheimer's disease Paternal Grandmother    Cancer Paternal Grandfather        lung   Cancer Sister        breast   Colon cancer Neg Hx     SOCHx:   reports that he quit smoking about 36 years  ago. His smoking use included cigarettes. He has never used smokeless tobacco. He reports that he does not currently use alcohol. He reports that he does not use drugs.  ALLERGIES:  Allergies  Allergen Reactions   Imdur [Isosorbide Nitrate]     Headache    Lipitor [Atorvastatin] Other (See Comments)    myalgias    ROS: Pertinent items noted in HPI and remainder of comprehensive ROS otherwise negative.  HOME MEDS: Current Outpatient Medications on File Prior to Visit  Medication Sig Dispense Refill   aspirin 81 MG EC tablet Take 81 mg by mouth daily.     Bempedoic Acid-Ezetimibe (NEXLIZET) 180-10 MG TABS TAKE ONE (1) TABLET BY MOUTH EVERY DAY 30 tablet 5   cetirizine (ZYRTEC ALLERGY) 10 MG tablet Take 1 tablet (10 mg total) by mouth daily.  90 tablet 1   fenofibrate (TRICOR) 145 MG tablet Take 1 tablet (145 mg total) by mouth daily. 30 tablet 5   fluticasone (FLONASE) 50 MCG/ACT nasal spray Place 2 sprays into both nostrils daily. 16 g 6   icosapent Ethyl (VASCEPA) 1 g capsule Take 2 capsules (2 g total) by mouth 2 (two) times daily. 120 capsule 5   levothyroxine (SYNTHROID) 112 MCG tablet TAKE ONE (1) TABLET BY MOUTH EVERY DAY 30 tablet 11   metFORMIN (GLUCOPHAGE) 500 MG tablet Take 1 tablet (500 mg total) by mouth 2 (two) times daily with a meal. (NEEDS TO BE SEEN BEFORE NEXT REFILL) 60 tablet 0   omeprazole (PRILOSEC) 20 MG capsule TAKE 2 CAPSULES BY MOUTH DAILY 180 capsule 0   ranolazine (RANEXA) 500 MG 12 hr tablet TAKE ONE (1) TABLET BY MOUTH TWO (2) TIMES DAILY 60 tablet 5   Vitamin D, Ergocalciferol, (DRISDOL) 1.25 MG (50000 UNIT) CAPS capsule TAKE 1 CAPSULE BY MOUTH EVERY 7 DAYS 12 capsule 1   No current facility-administered medications on file prior to visit.    LABS/IMAGING: No results found for this or any previous visit (from the past 48 hour(s)). No results found.  LIPID PANEL:    Component Value Date/Time   CHOL 372 (H) 09/12/2022 1243   CHOL 202 (H) 10/15/2012 0854   TRIG 1,766 (HH) 09/12/2022 1243   TRIG 590 (HH) 06/08/2014 1703   TRIG 241 (H) 10/15/2012 0854   HDL 6 (L) 09/12/2022 1243   HDL 43 06/08/2014 1703   HDL 39 (L) 10/15/2012 0854   CHOLHDL 62.0 (H) 09/12/2022 1243   CHOLHDL 5.9 CALC 01/18/2007 0846   VLDL 55 (H) 01/18/2007 0846   LDLCALC Comment (A) 09/12/2022 1243   LDLCALC 115 (H) 10/15/2012 0854   LDLDIRECT 72 05/13/2016 1100   LDLDIRECT 124.9 01/18/2007 0846    WEIGHTS: Wt Readings from Last 3 Encounters:  11/07/22 210 lb (95.3 kg)  10/31/22 200 lb (90.7 kg)  10/17/22 209 lb 12.8 oz (95.2 kg)    VITALS: BP 128/78   Pulse 72   Ht 6\' 3"  (1.905 m)   Wt 210 lb (95.3 kg)   SpO2 96%   BMI 26.25 kg/m   EXAM: Deferred  EKG: Deferred  ASSESSMENT: Mixed dyslipidemia with  high triglycerides Coronary artery disease Obstructive sleep apnea Statin intolerance-myalgias  PLAN: 1.   Mr. Granier has persistently elevated triglycerides.  He reports compliance with his medications.  Variability in his diet may be an explanation for this.  He is working on better treatment of his sleep apnea.  Unfortunately, although we have a new clinical trial coming up to address his high  triglycerides, his triglycerides may actually be too high to qualify.  Will continue working on his diet at this point.  Plan repeat lipids and follow-up with me in 6 months or sooner as necessary.  Chrystie Nose, MD, [facility], FACP  [place]  [facility]  Medical Director of the [facility]  [facility] Diplomate of the American Board of Clinical Lipidology Attending Cardiologist  Direct Dial: [phone]  Fax: [phone]  Website:  [email or web address] 11/07/2022, 3:19 PM

## 2022-11-12 DIAGNOSIS — G4733 Obstructive sleep apnea (adult) (pediatric): Secondary | ICD-10-CM | POA: Diagnosis not present

## 2022-11-14 ENCOUNTER — Other Ambulatory Visit: Payer: Self-pay | Admitting: Family

## 2022-11-14 DIAGNOSIS — E1165 Type 2 diabetes mellitus with hyperglycemia: Secondary | ICD-10-CM

## 2022-11-21 ENCOUNTER — Other Ambulatory Visit: Payer: Self-pay | Admitting: Family

## 2022-11-21 DIAGNOSIS — J069 Acute upper respiratory infection, unspecified: Secondary | ICD-10-CM

## 2022-11-21 NOTE — Telephone Encounter (Signed)
Patient treated for Covid on 10/17/22. Last time he had these filled was 01/20/22. Please review and advise on refill

## 2022-12-12 ENCOUNTER — Other Ambulatory Visit: Payer: Self-pay | Admitting: Family

## 2022-12-12 DIAGNOSIS — E1165 Type 2 diabetes mellitus with hyperglycemia: Secondary | ICD-10-CM

## 2023-01-09 ENCOUNTER — Other Ambulatory Visit: Payer: Self-pay | Admitting: Family

## 2023-01-09 DIAGNOSIS — E559 Vitamin D deficiency, unspecified: Secondary | ICD-10-CM

## 2023-01-12 ENCOUNTER — Ambulatory Visit: Payer: Medicare HMO | Admitting: Primary Care

## 2023-01-12 ENCOUNTER — Encounter: Payer: Self-pay | Admitting: Primary Care

## 2023-01-12 VITALS — BP 135/76 | HR 63 | Temp 98.0°F | Ht 75.0 in | Wt 213.2 lb

## 2023-01-12 DIAGNOSIS — G4733 Obstructive sleep apnea (adult) (pediatric): Secondary | ICD-10-CM

## 2023-01-12 MED ORDER — GUAIFENESIN-DM 100-10 MG/5ML PO SYRP: 5.0000 mL | ORAL_SOLUTION | ORAL | 0 refills | Status: DC | PRN

## 2023-01-12 NOTE — Progress Notes (Signed)
@Patient  ID: [name]., male    DOB: 12-16-1950, 72 y.o.   MRN: [id number]  No chief complaint on file.   Referring provider: [name], FNP  HPI: 72 year old male, former smoker. PMH significant for hypothyroidism, hyperlipidemia, CAD and GERD. Patient of Dr. [name].   Previous [facility] pulmonary encounter  05/20/2022 Patient presents today for overdue follow-up OSA. HST 10/04/2017 showed severe OSA, AHI 50/hour with SpO2 low 66%. He stopped consistently wearing CPAP a couple years back, he had some difficulty tolerating but is open to resuming. CPAP machine is 28-38 years old. He is not currently established with DME company to receive CPAP supplies. He recently purchased new tubing for CPAP out of pocket. Previous DME was [facility].   Obstructive sleep apnea - Sleep study in 2019 showed severe obstructive sleep apnea, AHI 50/hour apneic events an hour with a low oxygen level of 66%.  Patient was previously on CPAP but stopped wearing several years ago.  He is open to resuming use.  Unclear if current machine is in working order, he is likely due for replacement in the next month or two.  Reviewed risks of untreated sleep apnea including cardiac arrhythmias, stroke, pulmonary hypertension and diabetes. Due to break in therapy we will need to repeat sleep study, recommending getting in lab study with titration study to determine correct pressure settings and supplemental oxygen need.  After completing sleep study we can place orders for either new CPAP machine and/or CPAP supplies.   10/16/2022 Patient presents today for OSA follow-up. HST 10/04/2017 showed severe OSA, AHI 50/hour with SpO2 low 66%. He stopped consistently wearing CPAP a couple years back, he had some difficulty tolerating but is open to resuming.   He has been told that he snores. Sleep is restless. Associated daytime fatigue. NSPG 08/01/22 >> AHI 37.1. Reviewed sleep study results and treatment options. Patient is open to  resuming auto CPAP   01/12/2023- Interim hx  - HST 10/04/2017 showed severe OSA, AHI 50/hour with SpO2 low 66%. - NSPG 08/01/22 >> AHI 37.1/hour. Patient open to resuming auto CPAP.   Discussed the use of AI scribe software for clinical note transcription with the patient, who gave verbal consent to proceed.  History of Present Illness   Hx sleep apnea, he was started on CPAP therapy in September and presents today for compliance check in. He reports an average usage of around four hours per night, although this has been slightly disrupted recently due to a cough. The cough, which has now mostly resolved, prevented the patient from using the CPAP machine for approximately five nights. Despite this, the patient reports some benefits from using the CPAP machine, including reduced daytime lethargy.  The patient experienced some initial confusion regarding the operation of the CPAP machine, leading to a delay in usage for about seven days. However, he now feels he has a good handle on the device. The patient uses a full face mask with the CPAP machine, which he reports is comfortable and does not cause any issues.  The patient's CPAP machine is set to auto settings 5-20cm h20. The apnea score has significantly improved. The patient has noticed some air leaks, which he attributes to needing to change the mask. He reports having received a bill for new supplies but has not yet received them.  Overall, the patient is doing reasonably well with the CPAP machine and has noticed improvements in his symptoms. He has declined the option of an  Inspire surgical procedure at this time, preferring to continue with the CPAP treatment.      Airview download 12/08/22-01/06/23 97% compliant with use last 30 days  Median usage days used 4 hours 17 minutes Pressure 5 to 20 cm H2O (14.3 cm H2O-95%) Airleaks 24.8 L/min (95%) AHI 0.8    Allergies  Allergen Reactions   Imdur [Isosorbide Nitrate]     Headache     Lipitor [Atorvastatin] Other (See Comments)    myalgias    Immunization History  Administered Date(s) Administered   Influenza,inj,Quad PF,6+ Mos 01/20/2022   Tdap 12/08/2007    Past Medical History:  Diagnosis Date   Angina at rest    Benign neoplasm of colon    CAD (coronary artery disease)     Occluded RCA, 50% Diat 2006, 2008   Diverticulosis of colon (without mention of hemorrhage)    Esophageal reflux    Other and unspecified hyperlipidemia    Unspecified disorder of thyroid     Tobacco History: Social History   Tobacco Use  Smoking Status Former   Current packs/day: 0.00   Types: Cigarettes   Quit date: 07/22/1986   Years since quitting: 36.5  Smokeless Tobacco Never   Counseling given: Not Answered   Outpatient Medications Prior to Visit  Medication Sig Dispense Refill   aspirin 81 MG EC tablet Take 81 mg by mouth daily.     Bempedoic Acid-Ezetimibe (NEXLIZET) 180-10 MG TABS TAKE ONE (1) TABLET BY MOUTH EVERY DAY 30 tablet 5   benzonatate (TESSALON) 200 MG capsule TAKE ONE CAPSULE BY MOUTH THREE TIMES DAILY AS NEEDED 30 capsule 1   cetirizine (ZYRTEC ALLERGY) 10 MG tablet Take 1 tablet (10 mg total) by mouth daily. 90 tablet 1   fenofibrate (TRICOR) 145 MG tablet Take 1 tablet (145 mg total) by mouth daily. 30 tablet 5   fluticasone (FLONASE) 50 MCG/ACT nasal spray Place 2 sprays into both nostrils daily. 16 g 6   icosapent Ethyl (VASCEPA) 1 g capsule Take 2 capsules (2 g total) by mouth 2 (two) times daily. 120 capsule 5   levothyroxine (SYNTHROID) 112 MCG tablet TAKE ONE (1) TABLET BY MOUTH EVERY DAY 30 tablet 11   metFORMIN (GLUCOPHAGE) 500 MG tablet TAKE 1 TABLET BY MOUTH TWICE DAILY WITH MEALS 60 tablet 3   omeprazole (PRILOSEC) 20 MG capsule TAKE 2 CAPSULES BY MOUTH DAILY 180 capsule 0   ranolazine (RANEXA) 500 MG 12 hr tablet TAKE ONE (1) TABLET BY MOUTH TWO (2) TIMES DAILY 60 tablet 5   Vitamin D, Ergocalciferol, (DRISDOL) 1.25 MG (50000 UNIT) CAPS  capsule TAKE 1 CAPSULE BY MOUTH EVERY 7 DAYS 12 capsule 1   No facility-administered medications prior to visit.      Review of Systems  Review of Systems  Constitutional: Negative.   HENT: Negative.    Respiratory:  Positive for cough. Negative for chest tightness, shortness of breath and wheezing.   Cardiovascular: Negative.   Psychiatric/Behavioral:  Negative for sleep disturbance.      Physical Exam  There were no vitals taken for this visit. Physical Exam Constitutional:      Appearance: Normal appearance.  HENT:     Head: Normocephalic and atraumatic.     Mouth/Throat:     Mouth: Mucous membranes are moist.     Pharynx: Oropharynx is clear.  Cardiovascular:     Rate and Rhythm: Normal rate and regular rhythm.  Pulmonary:     Effort: Pulmonary effort is normal.  Breath sounds: Normal breath sounds.  Musculoskeletal:        General: Normal range of motion.  Skin:    General: Skin is warm and dry.  Neurological:     Mental Status: He is alert.  Psychiatric:        Mood and Affect: Mood normal.        Behavior: Behavior normal.        Thought Content: Thought content normal.        Judgment: Judgment normal.      Lab Results:  CBC    Component Value Date/Time   WBC 5.0 09/12/2022 1243   WBC 6.1 06/08/2014 1710   WBC 5.2 01/18/2007 1053   RBC 4.20 09/12/2022 1243   RBC 5.00 06/08/2014 1710   RBC 4.43 01/18/2007 1053   HGB 12.5 (L) 09/12/2022 1243   HCT 37.1 (L) 09/12/2022 1243   PLT 284 09/12/2022 1243   MCV 88 09/12/2022 1243   MCH 29.8 09/12/2022 1243   MCH 27.2 06/08/2014 1710   MCHC 33.7 09/12/2022 1243   MCHC 31.3 (A) 06/08/2014 1710   MCHC 34.6 01/18/2007 1053   RDW 14.2 09/12/2022 1243   LYMPHSABS 2.1 09/12/2022 1243   MONOABS 0.4 01/18/2007 1053   EOSABS 0.2 09/12/2022 1243   BASOSABS 0.1 09/12/2022 1243    BMET    Component Value Date/Time   NA 136 09/12/2022 1243   K 4.5 09/12/2022 1243   CL 98 09/12/2022 1243   CO2 20  09/12/2022 1243   GLUCOSE 98 09/12/2022 1243   GLUCOSE 101 (H) 10/15/2012 0854   BUN 19 09/12/2022 1243   CREATININE 1.22 09/12/2022 1243   CREATININE 1.40 (H) 10/15/2012 0854   CALCIUM 9.3 09/12/2022 1243   GFRNONAA 91 09/27/2019 1648   GFRNONAA 53 (L) 10/15/2012 0854   GFRAA 105 09/27/2019 1648   GFRAA 61 10/15/2012 0854    BNP No results found for: "BNP"  ProBNP No results found for: "PROBNP"  Imaging: No results found.   Assessment & Plan:   1. Obstructive sleep apnea  Obstructive Sleep Apnea - NSPG 08/01/22 >> AHI 37.1/hour. Currently on auto CPAP therapy with good compliance and significant improvement in apnea score (0.8 events per hour). Recent decrease in usage due to cough. -Continue CPAP therapy with current settings (auto 5-20). -Addressed patient's concerns about CPAP usage and provided reassurance on correct usage. -For cough, advised use of Delsym or Robitussin DM if it recurs and interferes with CPAP usage. -Will follow up with medical supply store regarding patient's CPAP supplies. -Follow up in 6 months to reassess CPAP compliance and effectiveness.      Glenford Bayley, NP 01/12/2023

## 2023-01-12 NOTE — Patient Instructions (Signed)
Good compliance, continue to wear CPAP nightly- aim 4 to 6 hours per night No pressure settings changes needed Sleep apnea is well controlled   Orders: Nurse to call [facility] about supplies being billed but not sent to patient   Follow-up 6 months with Beth NP / CPAP compliance   CPAP and BIPAP Information CPAP and BIPAP are methods that use air pressure to keep your airways open and to help you breathe well. CPAP and BIPAP use different amounts of pressure. Your health care provider will tell you whether CPAP or BIPAP would be more helpful for you. CPAP stands for "continuous positive airway pressure." With CPAP, the amount of pressure stays the same while you breathe in (inhale) and out (exhale). BIPAP stands for "bi-level positive airway pressure." With BIPAP, the amount of pressure will be higher when you inhale and lower when you exhale. This allows you to take larger breaths. CPAP or BIPAP may be used in the hospital, or your health care provider may want you to use it at home. You may need to have a sleep study before your health care provider can order a machine for you to use at home. What are the advantages? CPAP or BIPAP can be helpful if you have: Sleep apnea. Chronic obstructive pulmonary disease (COPD). Heart failure. Medical conditions that cause muscle weakness, including muscular dystrophy or amyotrophic lateral sclerosis (ALS). Other problems that cause breathing to be shallow, weak, abnormal, or difficult. CPAP and BIPAP are most commonly used for obstructive sleep apnea (OSA) to keep the airways from collapsing when the muscles relax during sleep. What are the risks? Generally, this is a safe treatment. However, problems may occur, including: Irritated skin or skin sores if the mask does not fit properly. Dry or stuffy nose or nosebleeds. Dry mouth. Feeling gassy or bloated. Sinus or lung infection if the equipment is not cleaned properly. When should CPAP or BIPAP  be used? In most cases, the mask only needs to be worn during sleep. Generally, the mask needs to be worn throughout the night and during any daytime naps. People with certain medical conditions may also need to wear the mask at other times, such as when they are awake. Follow instructions from your health care provider about when to use the machine. What happens during CPAP or BIPAP?  Both CPAP and BIPAP are provided by a small machine with a flexible plastic tube that attaches to a plastic mask that you wear. Air is blown through the mask into your nose or mouth. The amount of pressure that is used to blow the air can be adjusted on the machine. Your health care provider will set the pressure setting and help you find the best mask for you. Tips for using the mask Because the mask needs to be snug, some people feel trapped or closed-in (claustrophobic) when first using the mask. If you feel this way, you may need to get used to the mask. One way to do this is to hold the mask loosely over your nose or mouth and then gradually apply the mask more snugly. You can also gradually increase the amount of time that you use the mask. Masks are available in various types and sizes. If your mask does not fit well, talk with your health care provider about getting a different one. Some common types of masks include: Full face masks, which fit over the mouth and nose. Nasal masks, which fit over the nose. Nasal pillow or prong masks,  which fit into the nostrils. If you are using a mask that fits over your nose and you tend to breathe through your mouth, a chin strap may be applied to help keep your mouth closed. Use a skin barrier to protect your skin as told by your health care provider. Some CPAP and BIPAP machines have alarms that may sound if the mask comes off or develops a leak. If you have trouble with the mask, it is very important that you talk with your health care provider about finding a way to make  the mask easier to tolerate. Do not stop using the mask. There could be a negative impact on your health if you stop using the mask. Tips for using the machine Place your CPAP or BIPAP machine on a secure table or stand near an electrical outlet. Know where the on/off switch is on the machine. Follow instructions from your health care provider about how to set the pressure on your machine and when you should use it. Do not eat or drink while the CPAP or BIPAP machine is on. Food or fluids could get pushed into your lungs by the pressure of the CPAP or BIPAP. For home use, CPAP and BIPAP machines can be rented or purchased through home health care companies. Many different brands of machines are available. Renting a machine before purchasing may help you find out which particular machine works well for you. Your health insurance company may also decide which machine you may get. Keep the CPAP or BIPAP machine and attachments clean. Ask your health care provider for specific instructions. Check the humidifier if you have a dry stuffy nose or nosebleeds. Make sure it is working correctly. Follow these instructions at home: Take over-the-counter and prescription medicines only as told by your health care provider. Ask if you can take sinus medicine if your sinuses are blocked. Do not use any products that contain nicotine or tobacco. These products include cigarettes, chewing tobacco, and vaping devices, such as e-cigarettes. If you need help quitting, ask your health care provider. Keep all follow-up visits. This is important. Contact a health care provider if: You have redness or pressure sores on your head, face, mouth, or nose from the mask or head gear. You have trouble using the CPAP or BIPAP machine. You cannot tolerate wearing the CPAP or BIPAP mask. Someone tells you that you snore even when wearing your CPAP or BIPAP. Get help right away if: You have trouble breathing. You feel  confused. Summary CPAP and BIPAP are methods that use air pressure to keep your airways open and to help you breathe well. If you have trouble with the mask, it is very important that you talk with your health care provider about finding a way to make the mask easier to tolerate. Do not stop using the mask. There could be a negative impact to your health if you stop using the mask. Follow instructions from your health care provider about when to use the machine. This information is not intended to replace advice given to you by your health care provider. Make sure you discuss any questions you have with your health care provider. Document Revised: 09/05/2020 Document Reviewed: 01/06/2020 Elsevier Patient Education  2023 [employer].

## 2023-01-13 ENCOUNTER — Telehealth: Payer: Self-pay | Admitting: Family

## 2023-01-30 ENCOUNTER — Telehealth: Payer: Self-pay | Admitting: Family Medicine

## 2023-01-30 NOTE — Telephone Encounter (Unsigned)
Copied from CRM [id number]. Topic: Clinical - Prescription Issue >> Jan 30, 2023  2:40 PM Gildardo Pounds wrote: Reason for CRM: Patient called to speak with Raynelle Fanning regarding his prescription for Bempedoic Acid-Ezetimibe (NEXLIZET) 180-10 MG TABS. He is at the pharmacy how trying to pick it up, and they are charging him approximately $125. He asked about a "grant" she has previously gotten for him to get the meds for free. Please have Raynelle Fanning call patient at [phone]

## 2023-02-02 ENCOUNTER — Telehealth: Payer: Self-pay

## 2023-02-02 NOTE — Telephone Encounter (Signed)
Copied from CRM [id number]. Topic: Clinical - Medication Refill >> Feb 02, 2023 12:39 PM Joanette Gula wrote: Most Recent Primary Care Visit:  Provider: Lorrene Reid  Department: [facility]  Visit Type: MEDICARE AWV, SEQUENTIAL  Date: 10/31/2022  Medication: Bempedoic Acid-Ezetimibe (NEXLIZET) 180-10 MG TABS   Has the patient contacted their pharmacy? Yes (Agent: If no, request that the patient contact the pharmacy for the refill. If patient does not wish to contact the pharmacy document the reason why and proceed with request.) (Agent: If yes, when and what did the pharmacy advise?)  Is this the correct pharmacy for this prescription? Yes If no, delete pharmacy and type the correct one.  This is the patient's preferred pharmacy:  [facility], [place] - [address] [place] [address] Phone: [phone] Fax: [phone]   Has the prescription been filled recently? Yes  Is the patient out of the medication? Yes  Has the patient been seen for an appointment in the last year OR does the patient have an upcoming appointment? Yes  Can we respond through MyChart? Yes  Agent: Please be advised that Rx refills may take up to 3 business days. We ask that you follow-up with your pharmacy.

## 2023-02-02 NOTE — Telephone Encounter (Signed)
No refill needed at this time.

## 2023-02-02 NOTE — Telephone Encounter (Signed)
Medication sent through my chart

## 2023-02-03 ENCOUNTER — Telehealth: Payer: Self-pay | Admitting: Pharmacist

## 2023-02-03 DIAGNOSIS — E782 Mixed hyperlipidemia: Secondary | ICD-10-CM

## 2023-02-03 MED ORDER — NEXLIZET 180-10 MG PO TABS: ORAL_TABLET | ORAL | 5 refills | Status: DC

## 2023-02-03 NOTE — Telephone Encounter (Signed)
   Patient re-enrolled in the healthwell grant for Hypercholesterolemia (Nexlizet).  Grant updated and sent to patient.  New RX was called in to [facility].     Lipid Panel     Component Value Date/Time   CHOL 372 (H) 09/12/2022 1243   CHOL 202 (H) 10/15/2012 0854   TRIG 1,766 (HH) 09/12/2022 1243   TRIG 590 (HH) 06/08/2014 1703   TRIG 241 (H) 10/15/2012 0854   HDL 6 (L) 09/12/2022 1243   HDL 43 06/08/2014 1703   HDL 39 (L) 10/15/2012 0854   CHOLHDL 62.0 (H) 09/12/2022 1243   CHOLHDL 5.9 CALC 01/18/2007 0846   VLDL 55 (H) 01/18/2007 0846   LDLCALC Comment (A) 09/12/2022 1243   LDLCALC 115 (H) 10/15/2012 0854   LDLDIRECT 72 05/13/2016 1100   LDLDIRECT 124.9 01/18/2007 0846   LABVLDL Comment (A) 09/12/2022 1243     Kieth Brightly, PharmD, BCACP, CPP Clinical Pharmacist, [facility]

## 2023-02-05 ENCOUNTER — Ambulatory Visit: Payer: Medicare HMO | Admitting: Nurse Practitioner

## 2023-02-05 ENCOUNTER — Encounter: Payer: Self-pay | Admitting: Nurse Practitioner

## 2023-02-05 ENCOUNTER — Telehealth: Payer: Self-pay | Admitting: Family Medicine

## 2023-02-05 VITALS — BP 120/70 | HR 71 | Temp 97.6°F | Ht 75.0 in | Wt 210.0 lb

## 2023-02-05 DIAGNOSIS — R051 Acute cough: Secondary | ICD-10-CM | POA: Diagnosis not present

## 2023-02-05 DIAGNOSIS — J029 Acute pharyngitis, unspecified: Secondary | ICD-10-CM | POA: Insufficient documentation

## 2023-02-05 MED ORDER — BENZONATATE 200 MG PO CAPS: 200.0000 mg | ORAL_CAPSULE | Freq: Three times a day (TID) | ORAL | 1 refills | Status: DC | PRN

## 2023-02-05 MED ORDER — AZITHROMYCIN 250 MG PO TABS: ORAL_TABLET | ORAL | 0 refills | Status: DC

## 2023-02-05 NOTE — Telephone Encounter (Signed)
SAMPLES LEFT UP FRONT FOR PATIENT PATIENT AWARE

## 2023-02-05 NOTE — Progress Notes (Signed)
Acute Office Visit  Subjective:     Patient ID: Corey Sorrento., male    DOB: 01-May-1950, 72 y.o.   MRN: [id number]  Chief Complaint  Patient presents with   Nasal Congestion    Symptoms started over weekend. Pt is starting to feel better   Cough   Generalized Body Aches  Been taking  HPI [name]. is a 72 y.o. male presents for an acute visit on 02/05/23 who complains of congestion, fever, myalgias, chills, cough described as productive of yellow sputum, fever, and chills for 6 days. He denies a history of dizziness, shortness of breath, vomiting, and wheezing and denies a history of asthma. Patient denies smoke cigarettes. Has ben taking OTC guaifenesin with no relief.   Active Ambulatory Problems    Diagnosis Date Noted   Hypothyroidism 06/12/2007   Coronary artery disease 06/12/2007   Coronary artery disease involving native coronary artery of native heart with angina pectoris with documented spasm (HCC) 06/12/2007   GERD 06/12/2007   Diverticulosis of colon 06/12/2007   GUAIAC POSITIVE STOOL 06/12/2007   Diverticulosis of colon    Mixed hyperlipidemia    Benign neoplasm of colon    Aortic atherosclerosis (HCC) 08/05/2016   Obstructive sleep apnea 10/14/2017   Vitamin D deficiency 09/15/2018   Trigger middle finger of right hand 09/15/2018   Umbilical hernia without obstruction and without gangrene 06/14/2020   Type 2 diabetes mellitus with hyperglycemia (HCC) 01/15/2021   Myalgia due to statin 09/17/2021   Pharyngitis 02/05/2023   Acute cough 02/05/2023   Resolved Ambulatory Problems    Diagnosis Date Noted   COLONIC POLYPS 06/12/2007   CAD (coronary artery disease)    Function kidney decreased 12/22/2018   Dyslipidemia 02/14/2019   Educated about COVID-19 virus infection 02/14/2019   Type 1 diabetes mellitus with hyperglycemia (HCC) 12/31/2020   Acute cough 03/22/2021   Past Medical History:  Diagnosis Date   Angina at rest [facility])     Diverticulosis of colon (without mention of hemorrhage)    Other and unspecified hyperlipidemia    Unspecified disorder of thyroid     Review of Systems  Constitutional:  Positive for chills and fever.  HENT:  Positive for sore throat. Negative for ear pain.   Respiratory:  Negative for cough, shortness of breath and wheezing.   Cardiovascular:  Negative for chest pain.  Gastrointestinal:  Negative for constipation, diarrhea, nausea and vomiting.  Musculoskeletal:  Positive for myalgias.  Skin:  Negative for itching and rash.  Neurological:  Negative for dizziness and headaches.  Endo/Heme/Allergies:  Negative for environmental allergies.  Psychiatric/Behavioral:  Negative for suicidal ideas. The patient is not nervous/anxious.    Negative unless indicated in HPI    Objective:    BP 120/70   Pulse 71   Temp 97.6 F (36.4 C) (Temporal)   Ht 6\' 3"  (1.905 m)   Wt 210 lb (95.3 kg)   SpO2 97%   BMI 26.25 kg/m  BP Readings from Last 3 Encounters:  02/05/23 120/70  01/12/23 135/76  11/07/22 128/78   Wt Readings from Last 3 Encounters:  02/05/23 210 lb (95.3 kg)  01/12/23 213 lb 3.2 oz (96.7 kg)  11/07/22 210 lb (95.3 kg)      Physical Exam Vitals and nursing note reviewed.  Constitutional:      General: He is not in acute distress.    Appearance: Normal appearance.  HENT:     Head: Normocephalic and atraumatic.  Right Ear: Tympanic membrane, ear canal and external ear normal. There is no impacted cerumen.     Left Ear: Tympanic membrane, ear canal and external ear normal. There is no impacted cerumen.     Nose: Nose normal. No congestion.     Mouth/Throat:     Lips: Pink.     Mouth: Mucous membranes are moist.     Pharynx: Oropharynx is clear. Posterior oropharyngeal erythema and postnasal drip present. No uvula swelling.     Tonsils: No tonsillar exudate or tonsillar abscesses.  Eyes:     General: No scleral icterus.    Extraocular Movements: Extraocular  movements intact.     Conjunctiva/sclera: Conjunctivae normal.     Pupils: Pupils are equal, round, and reactive to light.  Cardiovascular:     Rate and Rhythm: Normal rate and regular rhythm.  Pulmonary:     Effort: Pulmonary effort is normal. No respiratory distress.     Breath sounds: Normal breath sounds. No wheezing.  Musculoskeletal:        General: Normal range of motion.     Right lower leg: No edema.     Left lower leg: No edema.  Skin:    General: Skin is warm and dry.  Neurological:     Mental Status: He is alert and oriented to person, place, and time. Mental status is at baseline.  Psychiatric:        Mood and Affect: Mood normal.        Behavior: Behavior normal.        Thought Content: Thought content normal.        Judgment: Judgment normal.    No results found for any visits on 02/05/23.      Assessment & Plan:  Acute cough -     Azithromycin; Take 2-tabs on day one and 1-tab daily until done  Dispense: 6 each; Refill: 0 -     Benzonatate; Take 1 capsule (200 mg total) by mouth 3 (three) times daily as needed.  Dispense: 30 capsule; Refill: 1  Pharyngitis, unspecified etiology -     Azithromycin; Take 2-tabs on day one and 1-tab daily until done  Dispense: 6 each; Refill: 0   [name] is 72 yrs old Caucasian male, no acute distress Pharyngitis: Z-Pak for 6 dispensed client instructed to take 1 to 2 tablets for the first day and 1 tablet until done Cough: Tessalon Perles 1 tablet 3 times daily with a full glass of water Increase hydration Tylenol or ibuprofen for fever  Encourage healthy lifestyle choices, including diet (rich in fruits, vegetables, and lean proteins, and low in salt and simple carbohydrates) and exercise (at least 30 minutes of moderate physical activity daily).     The above assessment and management plan was discussed with the patient. The patient verbalized understanding of and has agreed to the management plan. Patient is aware to call  the clinic if they develop any new symptoms or if symptoms persist or worsen. Patient is aware when to return to the clinic for a follow-up visit. Patient educated on when it is appropriate to go to the emergency department.  Return if symptoms worsen or fail to improve.    Arrie Aran Santa Lighter, [facility] [address] [place], [place] [address] [phone]  Note: This document was prepared by Reubin Milan voice dictation technology and any errors that results from this process are unintentional.

## 2023-02-05 NOTE — Telephone Encounter (Signed)
Copied from CRM [id number]. Topic: Clinical - Medication Refill >> Feb 02, 2023  5:02 PM [name] B wrote: Reason for CRM:  Bempedoic Acid-Ezetimibe (NEXLIZET) 180-10 MG TABS   Patient was asking if there was a possibility that samples were available because the medication as this time is not free the last time he received them he had assistance via a grant to get the medication

## 2023-02-06 ENCOUNTER — Ambulatory Visit: Payer: Medicare HMO | Admitting: Pharmacist

## 2023-02-06 DIAGNOSIS — E785 Hyperlipidemia, unspecified: Secondary | ICD-10-CM | POA: Diagnosis not present

## 2023-02-06 DIAGNOSIS — G720 Drug-induced myopathy: Secondary | ICD-10-CM

## 2023-02-06 NOTE — Progress Notes (Signed)
02/06/2023 Name: [name]. MRN: [id number] DOB: May 19, 1950  Chief Complaint  Patient presents with   Hyperlipidemia    Corey Rapp. is a 72 y.o. year old male who was referred for medication management by their primary care provider, Corey Spencer, FNP.    They were referred to the pharmacist by their PCP for assistance in managing hyperlipidemia and medication access   Subjective:  Care Team: Primary Care Provider: [name], FNP   Medication Access/Adherence  Current Pharmacy:  [facility] - [facility], [place] - [address] [place] [address] Phone: [phone] Fax: [phone]   Patient reports affordability concerns with their medications: Yes  Patient reports access/transportation concerns to their pharmacy: No  Patient reports adherence concerns with their medications:  No     Hyperlipidemia/ASCVD Risk Reduction  Current lipid lowering medications:  Nexlizet, fibrate Medications tried in the past: atorvastatin  Antiplatelet regimen: ASA  ASCVD History: CAD  Current physical activity: encouraged as able  Current medication access support: healthwell grant--need to re-enroll  Clinical ASCVD The ASCVD Risk score (Arnett DK, et al., 2019) failed to calculate for the following reasons:   The valid HDL cholesterol range is 20 to 100 mg/dL   The valid total cholesterol range is 130 to 320 mg/dL    Objective:  Lab Results  Component Value Date   HGBA1C 5.7 (H) 09/12/2022    Lab Results  Component Value Date   CREATININE 1.22 09/12/2022   BUN 19 09/12/2022   NA 136 09/12/2022   K 4.5 09/12/2022   CL 98 09/12/2022   CO2 20 09/12/2022    Lab Results  Component Value Date   CHOL 372 (H) 09/12/2022   HDL 6 (L) 09/12/2022   LDLCALC Comment (A) 09/12/2022   LDLDIRECT 72 05/13/2016   TRIG 1,766 (HH) 09/12/2022   CHOLHDL 62.0 (H) 09/12/2022    Medications Reviewed Today     Reviewed by  Danella Maiers, [facility] (Pharmacist) on 02/06/23 at 1001  Med List Status: <None>   Medication Order Taking? Sig Documenting Provider Last Dose Status Informant  aspirin 81 MG EC tablet [id number] No Take 81 mg by mouth daily. [provider] Taking Active   azithromycin (ZITHROMAX Z-PAK) 250 MG tablet [id number]  Take 2-tabs on day one and 1-tab daily until done [facility], Dois Davenport, NP  Active   Bempedoic Acid-Ezetimibe (NEXLIZET) 180-10 MG TABS [id number] No TAKE ONE (1) TABLET BY MOUTH EVERY DAY Hawks, Christy A, FNP Taking Active   benzonatate (TESSALON) 200 MG capsule [id number]  Take 1 capsule (200 mg total) by mouth 3 (three) times daily as needed. St Santa Lighter, Dois Davenport, NP  Active   cetirizine (ZYRTEC ALLERGY) 10 MG tablet [id number] No Take 1 tablet (10 mg total) by mouth daily. Corey Spencer, FNP Taking Active   fenofibrate (TRICOR) 145 MG tablet [id number] No Take 1 tablet (145 mg total) by mouth daily. Corey Spencer, FNP Taking Active   fluticasone (FLONASE) 50 MCG/ACT nasal spray [id number] No Place 2 sprays into both nostrils daily. Corey Spencer, FNP Taking Active   guaiFENesin-dextromethorphan (ROBITUSSIN DM) 100-10 MG/5ML syrup [id number] No Take 5 mLs by mouth every 4 (four) hours as needed for cough. Glenford Bayley, NP Taking Active   icosapent Ethyl (VASCEPA) 1 g capsule [id number] No Take 2 capsules (2 g total) by mouth 2 (two) times daily. Corey Spencer, FNP Taking Active  levothyroxine (SYNTHROID) 112 MCG tablet [id number] No TAKE ONE (1) TABLET BY MOUTH EVERY DAY Jannifer Rodney A, FNP Taking Active   metFORMIN (GLUCOPHAGE) 500 MG tablet [id number] No TAKE 1 TABLET BY MOUTH TWICE DAILY WITH MEALS Hawks, Christy A, FNP Taking Active   omeprazole (PRILOSEC) 20 MG capsule [id number] No TAKE 2 CAPSULES BY MOUTH DAILY Hawks, Christy A, FNP Taking Active   ranolazine (RANEXA) 500 MG 12 hr tablet [id number] No TAKE ONE (1) TABLET BY MOUTH TWO (2) TIMES DAILY Hawks,  Christy A, FNP Taking Active   Vitamin D, Ergocalciferol, (DRISDOL) 1.25 MG (50000 UNIT) CAPS capsule [id number] No TAKE 1 CAPSULE BY MOUTH EVERY 7 DAYS Dettinger, Elige Radon, MD Taking Active              Assessment/Plan:   Hyperlipidemia/ASCVD Risk Reduction: - Currently controlled. LDL 72--slightly above goal<70 - Reviewed long term complications of uncontrolled cholesterol - Reviewed dietary recommendations including FOLLOWING A HEART HEALTHY DIET/HEALTHY PLATE METHOD - Reviewed lifestyle recommendations including increased physical activity - Recommend to continue current regimen for HLD:  Nexlizet & fibrate - Patient re-enrolled in the healthwell foundation grant for [employer] (emailed card to patient) new RX [facility] store with grant info in the notes to pharmacy   Follow Up Plan: as needed  Kieth Brightly, PharmD, BCACP, CPP Clinical Pharmacist, [facility]

## 2023-02-25 DIAGNOSIS — H04123 Dry eye syndrome of bilateral lacrimal glands: Secondary | ICD-10-CM | POA: Diagnosis not present

## 2023-02-25 DIAGNOSIS — L03213 Periorbital cellulitis: Secondary | ICD-10-CM | POA: Diagnosis not present

## 2023-03-03 DIAGNOSIS — L03213 Periorbital cellulitis: Secondary | ICD-10-CM | POA: Diagnosis not present

## 2023-03-14 ENCOUNTER — Other Ambulatory Visit: Payer: Self-pay | Admitting: Family

## 2023-03-14 DIAGNOSIS — I2510 Atherosclerotic heart disease of native coronary artery without angina pectoris: Secondary | ICD-10-CM

## 2023-03-15 DIAGNOSIS — G4733 Obstructive sleep apnea (adult) (pediatric): Secondary | ICD-10-CM | POA: Diagnosis not present

## 2023-03-16 ENCOUNTER — Ambulatory Visit (INDEPENDENT_AMBULATORY_CARE_PROVIDER_SITE_OTHER): Payer: Medicare HMO | Admitting: Family

## 2023-03-16 ENCOUNTER — Encounter: Payer: Self-pay | Admitting: Family

## 2023-03-16 VITALS — BP 140/72 | HR 56 | Temp 97.8°F | Ht 75.0 in | Wt 209.0 lb

## 2023-03-16 DIAGNOSIS — I700 Atherosclerosis of aorta: Secondary | ICD-10-CM | POA: Diagnosis not present

## 2023-03-16 DIAGNOSIS — T466X5D Adverse effect of antihyperlipidemic and antiarteriosclerotic drugs, subsequent encounter: Secondary | ICD-10-CM

## 2023-03-16 DIAGNOSIS — E1165 Type 2 diabetes mellitus with hyperglycemia: Secondary | ICD-10-CM

## 2023-03-16 DIAGNOSIS — I2510 Atherosclerotic heart disease of native coronary artery without angina pectoris: Secondary | ICD-10-CM | POA: Diagnosis not present

## 2023-03-16 DIAGNOSIS — E039 Hypothyroidism, unspecified: Secondary | ICD-10-CM | POA: Diagnosis not present

## 2023-03-16 DIAGNOSIS — M791 Myalgia, unspecified site: Secondary | ICD-10-CM | POA: Diagnosis not present

## 2023-03-16 DIAGNOSIS — K219 Gastro-esophageal reflux disease without esophagitis: Secondary | ICD-10-CM

## 2023-03-16 DIAGNOSIS — G4733 Obstructive sleep apnea (adult) (pediatric): Secondary | ICD-10-CM | POA: Diagnosis not present

## 2023-03-16 DIAGNOSIS — E782 Mixed hyperlipidemia: Secondary | ICD-10-CM

## 2023-03-16 DIAGNOSIS — I2583 Coronary atherosclerosis due to lipid rich plaque: Secondary | ICD-10-CM

## 2023-03-16 DIAGNOSIS — T466X5A Adverse effect of antihyperlipidemic and antiarteriosclerotic drugs, initial encounter: Secondary | ICD-10-CM | POA: Diagnosis not present

## 2023-03-16 LAB — BAYER DCA HB A1C WAIVED: HB A1C (BAYER DCA - WAIVED): 5.6 % (ref 4.8–5.6)

## 2023-03-16 NOTE — Progress Notes (Signed)
Subjective:    Patient ID: [name]., male    DOB: [birth year], 73 y.o.   MRN: [id number]  Chief Complaint  Patient presents with   Medical Management of Chronic Issues    Left eye issues    Pt presents to the office today for chronic follow up.  He is followed by Cardiologists every 6 months for CAD.    He has OSA and using CPAP most nights. He is followed by Sleep Med.    He has aortic atherosclerosis and CAD  but can not tolerate statin related to myalgia.   Gastroesophageal Reflux He complains of belching, heartburn and a hoarse voice. This is a chronic problem. The current episode started more than 1 year ago. The problem occurs occasionally. Associated symptoms include fatigue. Risk factors include obesity. He has tried a PPI for the symptoms. The treatment provided moderate relief.  Diabetes He presents for his follow-up diabetic visit. He has type 2 diabetes mellitus. Associated symptoms include fatigue. Pertinent negatives for diabetes include no blurred vision and no foot paresthesias. Symptoms are stable. Risk factors for coronary artery disease include dyslipidemia, diabetes mellitus, hypertension and sedentary lifestyle. He is following a generally healthy diet. (Does not check glucose at home)  Thyroid Problem Presents for follow-up visit. Symptoms include fatigue and hoarse voice. Patient reports no constipation, diarrhea or dry skin. The symptoms have been stable.  Hyperlipidemia This is a chronic problem. The current episode started more than 1 year ago. The problem is uncontrolled. Recent lipid tests were reviewed and are high. Exacerbating diseases include obesity. Current antihyperlipidemic treatment includes fibric acid derivatives and herbal therapy. The current treatment provides mild improvement of lipids. Risk factors for coronary artery disease include dyslipidemia, hypertension, a sedentary lifestyle and male sex.      Review of Systems   Constitutional:  Positive for fatigue.  HENT:  Positive for hoarse voice.   Eyes:  Negative for blurred vision.  Gastrointestinal:  Positive for heartburn. Negative for constipation and diarrhea.  All other systems reviewed and are negative.  Family History  Problem Relation Age of Onset   Hyperlipidemia Father    Heart disease Father        pacemaker / CHF    Dementia Maternal Grandmother    Cancer Maternal Grandfather        throat   Alzheimer's disease Paternal Grandmother    Cancer Paternal Grandfather        lung   Cancer Sister        breast   Colon cancer Neg Hx    Social History   Socioeconomic History   Marital status: Widowed    Spouse name: Not on file   Number of children: 3   Years of education: 16   Highest education level: Bachelor's degree (e.g., BA, AB, BS)  Occupational History   Occupation: retired     Comment: golf / club prefessional   Tobacco Use   Smoking status: Former    Current packs/day: 0.00    Types: Cigarettes    Quit date: 07/22/1986    Years since quitting: 36.6   Smokeless tobacco: Never  Vaping Use   Vaping status: Never Used  Substance and Sexual Activity   Alcohol use: Not Currently   Drug use: No   Sexual activity: Not Currently  Other Topics Concern   Not on file  Social History Narrative   Retired, widowed, lives alone. 3 grown children. Enjoys golf.    Social  Drivers of Health   Financial Resource Strain: Patient Unable To Answer (10/31/2022)   Overall Financial Resource Strain (CARDIA)    Difficulty of Paying Living Expenses: Patient unable to answer  Food Insecurity: No Food Insecurity (10/31/2022)   Hunger Vital Sign    Worried About Running Out of Food in the Last Year: Never true    Ran Out of Food in the Last Year: Never true  Transportation Needs: No Transportation Needs (10/31/2022)   PRAPARE - Administrator, Civil Service (Medical): No    Lack of Transportation (Non-Medical): No  Physical  Activity: Sufficiently Active (10/31/2022)   Exercise Vital Sign    Days of Exercise per Week: 7 days    Minutes of Exercise per Session: 30 min  Stress: No Stress Concern Present (10/31/2022)   [employer] of Occupational Health - Occupational Stress Questionnaire    Feeling of Stress : Not at all  Social Connections: Moderately Isolated (10/31/2022)   Social Connection and Isolation Panel [NHANES]    Frequency of Communication with Friends and Family: More than three times a week    Frequency of Social Gatherings with Friends and Family: More than three times a week    Attends Religious Services: More than 4 times per year    Active Member of [employer] or Organizations: No    Attends Banker Meetings: Never    Marital Status: Widowed       Objective:   Physical Exam Vitals reviewed.  Constitutional:      General: He is not in acute distress.    Appearance: He is well-developed.  HENT:     Head: Normocephalic.     Right Ear: Tympanic membrane normal.     Left Ear: Tympanic membrane normal.  Eyes:     General:        Right eye: No discharge.        Left eye: No discharge.     Pupils: Pupils are equal, round, and reactive to light.  Neck:     Thyroid: No thyromegaly.  Cardiovascular:     Rate and Rhythm: Normal rate and regular rhythm.     Heart sounds: Normal heart sounds. No murmur heard. Pulmonary:     Effort: Pulmonary effort is normal. No respiratory distress.     Breath sounds: Normal breath sounds. No wheezing.  Abdominal:     General: Bowel sounds are normal. There is no distension.     Palpations: Abdomen is soft.     Tenderness: There is no abdominal tenderness.  Musculoskeletal:        General: No tenderness. Normal range of motion.     Cervical back: Normal range of motion and neck supple.  Skin:    General: Skin is warm and dry.     Findings: No erythema or rash.  Neurological:     Mental Status: He is alert and oriented to person, place,  and time.     Cranial Nerves: No cranial nerve deficit.     Deep Tendon Reflexes: Reflexes are normal and symmetric.  Psychiatric:        Behavior: Behavior normal.        Thought Content: Thought content normal.        Judgment: Judgment normal.     Diabetic Foot Exam - Simple   Simple Foot Form Diabetic Foot exam was performed with the following findings: Yes 03/16/2023 12:35 PM  Visual Inspection No deformities, no ulcerations, no other skin breakdown  bilaterally: Yes Sensation Testing Intact to touch and monofilament testing bilaterally: Yes Pulse Check Posterior Tibialis and Dorsalis pulse intact bilaterally: Yes Comments      BP (!) 140/72   Pulse (!) 56   Temp 97.8 F (36.6 C) (Temporal)   Ht 6\' 3"  (1.905 m)   Wt 209 lb (94.8 kg)   SpO2 96%   BMI 26.12 kg/m      Assessment & Plan:  [name]. comes in today with chief complaint of Medical Management of Chronic Issues (Left eye issues )   Diagnosis and orders addressed:  1. Aortic atherosclerosis (HCC) - CMP14+EGFR  2. Coronary artery disease due to lipid rich plaque - CMP14+EGFR  3. Gastroesophageal reflux disease without esophagitis - CMP14+EGFR  4. Acquired hypothyroidism (Primary) - CMP14+EGFR - TSH  5. Mixed hyperlipidemia - CMP14+EGFR  6. Myalgia due to statin - CMP14+EGFR  7. Obstructive sleep apnea - CMP14+EGFR  8. Type 2 diabetes mellitus with hyperglycemia, without long-term current use of insulin (HCC)  - CMP14+EGFR - Bayer DCA Hb A1c Waived  Labs pending Continue current medications  Keep follow up with specialists  Health Maintenance reviewed Diet and exercise encouraged  Follow up plan: 6 months    Jannifer Rodney, FNP

## 2023-03-16 NOTE — Patient Instructions (Signed)

## 2023-03-17 LAB — LIPID PANEL
Chol/HDL Ratio: 8.4 {ratio} — ABNORMAL HIGH (ref 0.0–5.0)
Cholesterol, Total: 244 mg/dL — ABNORMAL HIGH (ref 100–199)
HDL: 29 mg/dL — ABNORMAL LOW (ref >39)
LDL Chol Calc (NIH): 100 mg/dL — ABNORMAL HIGH (ref 0–99)
Triglycerides: 676 mg/dL (ref 0–149)
VLDL Cholesterol Cal: 115 mg/dL — ABNORMAL HIGH (ref 5–40)

## 2023-03-17 LAB — CMP14+EGFR
ALT: 40 [IU]/L (ref 0–44)
AST: 44 [IU]/L — ABNORMAL HIGH (ref 0–40)
Albumin: 4.3 g/dL (ref 3.8–4.8)
Alkaline Phosphatase: 78 [IU]/L (ref 44–121)
BUN/Creatinine Ratio: 13 (ref 10–24)
BUN: 13 mg/dL (ref 8–27)
Bilirubin Total: 0.4 mg/dL (ref 0.0–1.2)
CO2: 24 mmol/L (ref 20–29)
Calcium: 9.5 mg/dL (ref 8.6–10.2)
Chloride: 101 mmol/L (ref 96–106)
Creatinine, Ser: 0.99 mg/dL (ref 0.76–1.27)
Globulin, Total: 2.9 g/dL (ref 1.5–4.5)
Glucose: 100 mg/dL — ABNORMAL HIGH (ref 70–99)
Potassium: 4.5 mmol/L (ref 3.5–5.2)
Sodium: 140 mmol/L (ref 134–144)
Total Protein: 7.2 g/dL (ref 6.0–8.5)
eGFR: 80 mL/min/{1.73_m2} (ref >59)

## 2023-03-17 LAB — TSH: TSH: 2.78 u[IU]/mL (ref 0.450–4.500)

## 2023-03-17 NOTE — Telephone Encounter (Signed)
 Copied from CRM [id number]. Topic: Clinical - Medical Advice >> Mar 16, 2023  5:33 PM Elle L wrote: Reason for CRM: The patient is concerned about the print out he received at his office visit regarding Hypothyroidism as he did not speak about the symptoms listed on the print out. His call back number is [phone].

## 2023-04-12 DIAGNOSIS — G4733 Obstructive sleep apnea (adult) (pediatric): Secondary | ICD-10-CM | POA: Diagnosis not present

## 2023-04-13 DIAGNOSIS — H40021 Open angle with borderline findings, high risk, right eye: Secondary | ICD-10-CM | POA: Diagnosis not present

## 2023-04-13 DIAGNOSIS — H35373 Puckering of macula, bilateral: Secondary | ICD-10-CM | POA: Diagnosis not present

## 2023-04-13 DIAGNOSIS — H40012 Open angle with borderline findings, low risk, left eye: Secondary | ICD-10-CM | POA: Diagnosis not present

## 2023-04-13 DIAGNOSIS — H524 Presbyopia: Secondary | ICD-10-CM | POA: Diagnosis not present

## 2023-04-13 DIAGNOSIS — E119 Type 2 diabetes mellitus without complications: Secondary | ICD-10-CM | POA: Diagnosis not present

## 2023-04-13 DIAGNOSIS — H5211 Myopia, right eye: Secondary | ICD-10-CM | POA: Diagnosis not present

## 2023-04-13 DIAGNOSIS — H52222 Regular astigmatism, left eye: Secondary | ICD-10-CM | POA: Diagnosis not present

## 2023-04-13 LAB — HM DIABETES EYE EXAM

## 2023-04-30 ENCOUNTER — Other Ambulatory Visit: Payer: Self-pay | Admitting: Family

## 2023-04-30 DIAGNOSIS — I2510 Atherosclerotic heart disease of native coronary artery without angina pectoris: Secondary | ICD-10-CM

## 2023-05-05 ENCOUNTER — Encounter (HOSPITAL_BASED_OUTPATIENT_CLINIC_OR_DEPARTMENT_OTHER): Payer: Self-pay | Admitting: Internal Medicine

## 2023-05-05 ENCOUNTER — Ambulatory Visit (HOSPITAL_BASED_OUTPATIENT_CLINIC_OR_DEPARTMENT_OTHER): Payer: Medicare HMO | Admitting: Internal Medicine

## 2023-05-05 VITALS — BP 132/80 | HR 69 | Ht 75.0 in | Wt 214.8 lb

## 2023-05-05 DIAGNOSIS — I2510 Atherosclerotic heart disease of native coronary artery without angina pectoris: Secondary | ICD-10-CM | POA: Diagnosis not present

## 2023-05-05 DIAGNOSIS — M791 Myalgia, unspecified site: Secondary | ICD-10-CM | POA: Diagnosis not present

## 2023-05-05 DIAGNOSIS — T466X5D Adverse effect of antihyperlipidemic and antiarteriosclerotic drugs, subsequent encounter: Secondary | ICD-10-CM

## 2023-05-05 DIAGNOSIS — T466X5A Adverse effect of antihyperlipidemic and antiarteriosclerotic drugs, initial encounter: Secondary | ICD-10-CM

## 2023-05-05 DIAGNOSIS — I700 Atherosclerosis of aorta: Secondary | ICD-10-CM | POA: Diagnosis not present

## 2023-05-05 DIAGNOSIS — E781 Pure hyperglyceridemia: Secondary | ICD-10-CM | POA: Diagnosis not present

## 2023-05-05 NOTE — Patient Instructions (Signed)
 Medication Instructions:  NO CHANGES *If you need a refill on your cardiac medications before your next appointment, please call your pharmacy*   Lab Work: FASTING lab work in 6 months If you have labs (blood work) drawn today and your tests are completely normal, you will receive your results only by: [employer] (if you have MyChart) OR A paper copy in the mail If you have any lab test that is abnormal or we need to change your treatment, we will call you to review the results.   Follow-Up: At [facility], you and your health needs are our priority.  As part of our continuing mission to provide you with exceptional heart care, we have created designated Provider Care Teams.  These Care Teams include your primary Cardiologist (physician) and Advanced Practice Providers (APPs -  Physician Assistants and Nurse Practitioners) who all work together to provide you with the care you need, when you need it.  We recommend signing up for the patient portal called "MyChart".  Sign up information is provided on this After Visit Summary.  MyChart is used to connect with patients for Virtual Visits (Telemedicine).  Patients are able to view lab/test results, encounter notes, upcoming appointments, etc.  Non-urgent messages can be sent to your provider as well.   To learn more about what you can do with MyChart, go to [email or web address].    Your next appointment:   6 months with Dr. Rennis Golden or Eligha Bridegroom NP - lipid clinic

## 2023-05-05 NOTE — Progress Notes (Signed)
 LIPID CLINIC CONSULT NOTE  Chief Complaint:  Follow-up triglycerides  Primary Care Physician: [name], FNP  Primary Cardiologist:  None  HPI:  Hanif Radin. is a 73 y.o. male who is being seen today for the evaluation of high triglycerides at the request of [name], FNP. This is a send 73 year old male kindly referred for evaluation management of high triglycerides.  He reports a longstanding history of this.  Unfortunately he could not tolerate statins.  He had tried Lipitor in the past which caused muscle aches and more recently was on low-dose rosuvastatin but has not been taking it due to similar side effects.  His triglycerides have always run high in the 100s if not thousands.  Recent labs showed total cholesterol of 295, triglycerides 1371 and HDL 15.  LDL was not calculated.  His medication regimen includes fenofibrate 145 mg every day and Nexlizet 180/10 mg every day.  He reports trying to avoid saturated fats in his diet.  He does have known coronary disease including an occluded RCA and additional disease noted by cath.  He has been followed by Dr. Antoine Poche and was last seen in 2021.  05/01/2022  Mr. Uzzle is seen today in follow-up of his high triglycerides.  Repeat labs demonstrate total cholesterol 286 which is down from 384 about 3 months ago.  Triglycerides 1289, HDL 14 and LDL could not be calculated.  ApoB was obtained and was 175, however this is improved from 213 about 3 months ago.  He is taking  Nexlizet, fenofibrate and Vascepa.  Overall he is tolerating his meds.  He says that he sometimes has trouble remembering to take the Vascepa more than 1 capsule/day.  11/07/2022  Mr. Vasco is seen today for follow-up.  Overall he says he is feeling pretty well.  His triglycerides however continue to be poorly controlled.  Most recently his triglycerides were 1766 with total cholesterol 372.  He reports compliance with his medications.  He sounds like  his diet is rather inconsistent.  He may eat something for breakfast but nothing again till later in the day that he may snack some days but not needed others.  This may be causing spikes in insulin and perhaps some lability to his triglycerides.  It is also not clear if his last testing was fasting or not.  He denies any symptoms of pancreatitis.  05/05/2023  Mr. Grieder is seen today in follow-up.  He seems to be feeling pretty well.  He has had some left shoulder pain recently.  He says it is getting little better but wondered if it could be related to his medicines.  His triglycerides are still high at 676, with total cholesterol of 244, HDL 29 and LDL 100.  He remains above his target LDL.  He asked today about Repatha.  That certainly is an option for him as could be Leqvio to reach his goals, however he does not really want to add injectable medicines to his repertoire.  We also discussed clinical trials.  There is a new agent out from [employer] that we will be investigating which is another APO C3 inhibitor, this is an injectable given at once monthly.  PMHx:  Past Medical History:  Diagnosis Date   Angina at rest [facility])    Benign neoplasm of colon    CAD (coronary artery disease)     Occluded RCA, 50% Diat 2006, 2008   Diverticulosis of colon (without mention of hemorrhage)  Esophageal reflux    Other and unspecified hyperlipidemia    Unspecified disorder of thyroid     Past Surgical History:  Procedure Laterality Date   TONSILLECTOMY  1955    FAMHx:  Family History  Problem Relation Age of Onset   Hyperlipidemia Father    Heart disease Father        pacemaker / CHF    Dementia Maternal Grandmother    Cancer Maternal Grandfather        throat   Alzheimer's disease Paternal Grandmother    Cancer Paternal Grandfather        lung   Cancer Sister        breast   Colon cancer Neg Hx     SOCHx:   reports that he quit smoking about 36 years ago. His smoking  use included cigarettes. He has never used smokeless tobacco. He reports that he does not currently use alcohol. He reports that he does not use drugs.  ALLERGIES:  Allergies  Allergen Reactions   Imdur [Isosorbide Nitrate]     Headache    Lipitor [Atorvastatin] Other (See Comments)    myalgias    ROS: Pertinent items noted in HPI and remainder of comprehensive ROS otherwise negative.  HOME MEDS: Current Outpatient Medications on File Prior to Visit  Medication Sig Dispense Refill   aspirin 81 MG EC tablet Take 81 mg by mouth daily.     Bempedoic Acid-Ezetimibe (NEXLIZET) 180-10 MG TABS TAKE ONE (1) TABLET BY MOUTH EVERY DAY 30 tablet 5   cetirizine (ZYRTEC ALLERGY) 10 MG tablet Take 1 tablet (10 mg total) by mouth daily. 90 tablet 1   fenofibrate (TRICOR) 145 MG tablet Take 1 tablet (145 mg total) by mouth daily. 30 tablet 5   fluticasone (FLONASE) 50 MCG/ACT nasal spray Place 2 sprays into both nostrils daily. 16 g 6   icosapent Ethyl (VASCEPA) 1 g capsule Take 2 capsules (2 g total) by mouth 2 (two) times daily. 120 capsule 5   levothyroxine (SYNTHROID) 112 MCG tablet TAKE ONE (1) TABLET BY MOUTH EVERY DAY 30 tablet 11   metFORMIN (GLUCOPHAGE) 500 MG tablet TAKE 1 TABLET BY MOUTH TWICE DAILY WITH MEALS 60 tablet 3   neomycin-polymyxin b-dexamethasone (MAXITROL) 3.5-10000-0.1 OINT      omeprazole (PRILOSEC) 20 MG capsule TAKE 2 CAPSULES BY MOUTH DAILY 180 capsule 0   ranolazine (RANEXA) 500 MG 12 hr tablet TAKE ONE (1) TABLET BY MOUTH TWO (2) TIMES DAILY 60 tablet 4   Vitamin D, Ergocalciferol, (DRISDOL) 1.25 MG (50000 UNIT) CAPS capsule TAKE 1 CAPSULE BY MOUTH EVERY 7 DAYS 12 capsule 1   No current facility-administered medications on file prior to visit.    LABS/IMAGING: No results found for this or any previous visit (from the past 48 hours). No results found.  LIPID PANEL:    Component Value Date/Time   CHOL 244 (H) 03/16/2023 1245   CHOL 202 (H) 10/15/2012 0854    TRIG 676 (HH) 03/16/2023 1245   TRIG 590 (HH) 06/08/2014 1703   TRIG 241 (H) 10/15/2012 0854   HDL 29 (L) 03/16/2023 1245   HDL 43 06/08/2014 1703   HDL 39 (L) 10/15/2012 0854   CHOLHDL 8.4 (H) 03/16/2023 1245   CHOLHDL 5.9 CALC 01/18/2007 0846   VLDL 55 (H) 01/18/2007 0846   LDLCALC 100 (H) 03/16/2023 1245   LDLCALC 115 (H) 10/15/2012 0854   LDLDIRECT 72 05/13/2016 1100   LDLDIRECT 124.9 01/18/2007 0846    WEIGHTS: Wt  Readings from Last 3 Encounters:  05/05/23 214 lb 12.8 oz (97.4 kg)  03/16/23 209 lb (94.8 kg)  02/05/23 210 lb (95.3 kg)    VITALS: BP 132/80   Pulse 69   Ht 6\' 3"  (1.905 m)   Wt 214 lb 12.8 oz (97.4 kg)   SpO2 98%   BMI 26.85 kg/m   EXAM: Deferred  EKG: Deferred  ASSESSMENT: Mixed dyslipidemia with high triglycerides Coronary artery disease Obstructive sleep apnea Statin intolerance-myalgias  PLAN: 1.   Mr. Gunby has had some relative improvement in triglycerides however they remain elevated at 676.  His A1c however is excellent at 5.6 on some metformin.  He inquired about the PCSK9 inhibitors but is not particularly interested in injections.  We also discussed clinical trial options including an newer APO C3 inhibitor from [employer].  He might qualify for this trial.  I will hold onto his name for it if he is interested.  Plan otherwise we will continue our current therapy.  Repeat lipids in 6 months and follow-up with Marcelino Duster at that time.  Chrystie Nose, MD, [facility], FACP  [place]  [facility]  Medical Director of the Advanced Lipid Disorders &  Cardiovascular Risk Reduction Clinic Diplomate of the American Board of Clinical Lipidology Attending Cardiologist  Direct Dial: [phone]  Fax: [phone]  Website:  [email or web address]  Chrystie Nose 05/05/2023, 3:18 PM

## 2023-05-12 ENCOUNTER — Encounter: Payer: Self-pay | Admitting: Cardiology

## 2023-05-13 DIAGNOSIS — G4733 Obstructive sleep apnea (adult) (pediatric): Secondary | ICD-10-CM | POA: Diagnosis not present

## 2023-06-12 DIAGNOSIS — G4733 Obstructive sleep apnea (adult) (pediatric): Secondary | ICD-10-CM | POA: Diagnosis not present

## 2023-06-18 ENCOUNTER — Other Ambulatory Visit: Payer: Self-pay | Admitting: *Deleted

## 2023-06-18 DIAGNOSIS — E1165 Type 2 diabetes mellitus with hyperglycemia: Secondary | ICD-10-CM

## 2023-06-18 MED ORDER — METFORMIN HCL 500 MG PO TABS: 500.0000 mg | ORAL_TABLET | Freq: Two times a day (BID) | ORAL | 0 refills | Status: DC

## 2023-06-29 ENCOUNTER — Other Ambulatory Visit: Payer: Self-pay | Admitting: Family Medicine

## 2023-06-29 DIAGNOSIS — E559 Vitamin D deficiency, unspecified: Secondary | ICD-10-CM

## 2023-07-01 DIAGNOSIS — G4733 Obstructive sleep apnea (adult) (pediatric): Secondary | ICD-10-CM | POA: Diagnosis not present

## 2023-07-13 ENCOUNTER — Encounter: Payer: Self-pay | Admitting: Primary Care

## 2023-07-13 ENCOUNTER — Ambulatory Visit: Payer: Medicare HMO | Admitting: Primary Care

## 2023-07-13 VITALS — BP 134/73 | HR 62 | Temp 97.8°F | Ht 75.0 in | Wt 209.6 lb

## 2023-07-13 DIAGNOSIS — Z87891 Personal history of nicotine dependence: Secondary | ICD-10-CM | POA: Diagnosis not present

## 2023-07-13 DIAGNOSIS — G4733 Obstructive sleep apnea (adult) (pediatric): Secondary | ICD-10-CM

## 2023-07-13 DIAGNOSIS — Z9981 Dependence on supplemental oxygen: Secondary | ICD-10-CM | POA: Diagnosis not present

## 2023-07-13 NOTE — Progress Notes (Signed)
 @Patient  ID: [name]., male    DOB: 07/05/1950, 73 y.o.   MRN: [id number]  Chief Complaint  Patient presents with   Follow-up    OSA f/u    Referring provider: Yevette Hem, FNP  HPI:  73 year old male, former smoker. PMH significant for hypothyroidism, hyperlipidemia, CAD and GERD. Patient of Dr. Gaynell Phillips.   Previous [facility] pulmonary encounter  05/20/2022 Patient presents today for overdue follow-up OSA. HST 10/04/2017 showed severe OSA, AHI 50/hour with SpO2 low 66%. He stopped consistently wearing CPAP a couple years back, he had some difficulty tolerating but is open to resuming. CPAP machine is 63-34 years old. He is not currently established with DME company to receive CPAP supplies. He recently purchased new tubing for CPAP out of pocket. Previous DME was [facility].   Obstructive sleep apnea - Sleep study in 2019 showed severe obstructive sleep apnea, AHI 50/hour apneic events an hour with a low oxygen level of 66%.  Patient was previously on CPAP but stopped wearing several years ago.  He is open to resuming use.  Unclear if current machine is in working order, he is likely due for replacement in the next month or two.  Reviewed risks of untreated sleep apnea including cardiac arrhythmias, stroke, pulmonary hypertension and diabetes. Due to break in therapy we will need to repeat sleep study, recommending getting in lab study with titration study to determine correct pressure settings and supplemental oxygen need.  After completing sleep study we can place orders for either new CPAP machine and/or CPAP supplies.   10/16/2022 Patient presents today for OSA follow-up. HST 10/04/2017 showed severe OSA, AHI 50/hour with SpO2 low 66%. He stopped consistently wearing CPAP a couple years back, he had some difficulty tolerating but is open to resuming.   He has been told that he snores. Sleep is restless. Associated daytime fatigue. NSPG 08/01/22 >> AHI 37.1. Reviewed sleep study results and  treatment options. Patient is open to resuming auto CPAP   01/12/2023 - HST 10/04/2017 showed severe OSA, AHI 50/hour with SpO2 low 66%. - NSPG 08/01/22 >> AHI 37.1/hour. Patient open to resuming auto CPAP.   Discussed the use of AI scribe software for clinical note transcription with the patient, who gave verbal consent to proceed.  History of Present Illness   Hx sleep apnea, he was started on CPAP therapy in September and presents today for compliance check in. He reports an average usage of around four hours per night, although this has been slightly disrupted recently due to a cough. The cough, which has now mostly resolved, prevented the patient from using the CPAP machine for approximately five nights. Despite this, the patient reports some benefits from using the CPAP machine, including reduced daytime lethargy.  The patient experienced some initial confusion regarding the operation of the CPAP machine, leading to a delay in usage for about seven days. However, he now feels he has a good handle on the device. The patient uses a full face mask with the CPAP machine, which he reports is comfortable and does not cause any issues.  The patient's CPAP machine is set to auto settings 5-20cm h20. The apnea score has significantly improved. The patient has noticed some air leaks, which he attributes to needing to change the mask. He reports having received a bill for new supplies but has not yet received them.  Overall, the patient is doing reasonably well with the CPAP machine and has noticed improvements in his  symptoms. He has declined the option of an Inspire surgical procedure at this time, preferring to continue with the CPAP treatment.      Airview download 12/08/22-01/06/23 97% compliant with use last 30 days  Median usage days used 4 hours 17 minutes Pressure 5 to 20 cm H2O (14.3 cm H2O-95%) Airleaks 24.8 L/min (95%) AHI 0.8   07/13/2023- Interim hx Discussed the use of AI scribe  software for clinical note transcription with the patient, who gave verbal consent to proceed.  History of Present Illness   Chirstopher Phillips. is a 73 year old male with severe sleep apnea who presents with issues related to CPAP use.  He has a history of severe sleep apnea, diagnosed in 2019 with a sleep study showing an average of 50 apneic events per hour. He has been using a CPAP machine as treatment. Recently, he experienced significant nasal congestion and sinus infections, which he attributes to CPAP use. Approximately three to four weeks ago, he stopped using the CPAP due to severe nasal infection symptoms, which have since resolved after discontinuing the device.  He received new CPAP supplies recently, including a new tube, mask, and filters, but experienced issues with the ordering process. He has resumed CPAP use over the past few days, averaging over five hours of use per night, which he considers an improvement. He suspects the previous equipment was leaking air due to wear and tear, contributing to his symptoms.  He has experienced two episodes of eye issues, described as 'droopy eye,' occurring six months apart, which he associates with CPAP use. The first episode was diagnosed as cellulitis. He notes pressure from the CPAP mask on his face, leading to imprints and potential air leaks that may dry out his eyes.  He has explored alternative treatments, such as an oral appliance. He has also considered using a mouthpiece in the past, which he felt improved his sleep quality and reduced teeth grinding.     Airview download 04/11/23-07/09/23 Usage 46/90 days (51%); 33 days (37%) Average usage days used 4 hours 20 mins Pressure 5-20cm h20 (15.7cm h2-95%) Airleaks 50L/min (95%) AHI 0.9   Allergies  Allergen Reactions   Imdur [Isosorbide Nitrate]     Headache    Lipitor [Atorvastatin] Other (See Comments)    myalgias    Immunization History  Administered Date(s) Administered    Influenza,inj,Quad PF,6+ Mos 01/20/2022   Tdap 12/08/2007    Past Medical History:  Diagnosis Date   Angina at rest [facility])    Benign neoplasm of colon    CAD (coronary artery disease)     Occluded RCA, 50% Diat 2006, 2008   Diverticulosis of colon (without mention of hemorrhage)    Esophageal reflux    Other and unspecified hyperlipidemia    Unspecified disorder of thyroid      Tobacco History: Social History   Tobacco Use  Smoking Status Former   Current packs/day: 0.00   Types: Cigarettes   Quit date: 07/22/1986   Years since quitting: 37.0  Smokeless Tobacco Never   Counseling given: Not Answered   Outpatient Medications Prior to Visit  Medication Sig Dispense Refill   aspirin 81 MG EC tablet Take 81 mg by mouth daily.     Bempedoic Acid-Ezetimibe  (NEXLIZET ) 180-10 MG TABS TAKE ONE (1) TABLET BY MOUTH EVERY DAY 30 tablet 5   cetirizine  (ZYRTEC  ALLERGY) 10 MG tablet Take 1 tablet (10 mg total) by mouth daily. 90 tablet 1   fenofibrate  (TRICOR ) 145 MG  tablet Take 1 tablet (145 mg total) by mouth daily. 30 tablet 5   fluticasone  (FLONASE ) 50 MCG/ACT nasal spray Place 2 sprays into both nostrils daily. 16 g 6   icosapent  Ethyl (VASCEPA ) 1 g capsule Take 2 capsules (2 g total) by mouth 2 (two) times daily. 120 capsule 5   levothyroxine  (SYNTHROID ) 112 MCG tablet TAKE ONE (1) TABLET BY MOUTH EVERY DAY 30 tablet 11   metFORMIN  (GLUCOPHAGE ) 500 MG tablet Take 1 tablet (500 mg total) by mouth 2 (two) times daily with a meal. 200 tablet 0   neomycin-polymyxin b-dexamethasone  (MAXITROL) 3.5-10000-0.1 OINT      omeprazole  (PRILOSEC ) 20 MG capsule TAKE 2 CAPSULES BY MOUTH DAILY 180 capsule 0   ranolazine  (RANEXA ) 500 MG 12 hr tablet TAKE ONE (1) TABLET BY MOUTH TWO (2) TIMES DAILY 60 tablet 4   Vitamin D , Ergocalciferol , (DRISDOL ) 1.25 MG (50000 UNIT) CAPS capsule TAKE 1 CAPSULE BY MOUTH EVERY 7 DAYS 12 capsule 1   No facility-administered medications prior to visit.      Review  of Systems  Review of Systems  Constitutional: Negative.   HENT:  Positive for postnasal drip.   Respiratory: Negative.    Cardiovascular: Negative.    Physical Exam  BP 134/73 (BP Location: Right Arm, Patient Position: Sitting, Cuff Size: Large)   Pulse 62   Temp 97.8 F (36.6 C) (Temporal)   Ht 6\' 3"  (1.905 m)   Wt 209 lb 9.6 oz (95.1 kg)   SpO2 97%   BMI 26.20 kg/m  Physical Exam Constitutional:      Appearance: Normal appearance.  HENT:     Head: Normocephalic and atraumatic.  Cardiovascular:     Rate and Rhythm: Normal rate and regular rhythm.  Pulmonary:     Effort: Pulmonary effort is normal.     Breath sounds: Normal breath sounds.  Musculoskeletal:        General: Normal range of motion.  Skin:    General: Skin is warm and dry.  Neurological:     General: No focal deficit present.     Mental Status: He is alert and oriented to person, place, and time. Mental status is at baseline.  Psychiatric:        Mood and Affect: Mood normal.        Behavior: Behavior normal.        Thought Content: Thought content normal.        Judgment: Judgment normal.      Lab Results:  CBC    Component Value Date/Time   WBC 5.0 09/12/2022 1243   WBC 6.1 06/08/2014 1710   WBC 5.2 01/18/2007 1053   RBC 4.20 09/12/2022 1243   RBC 5.00 06/08/2014 1710   RBC 4.43 01/18/2007 1053   HGB 12.5 (L) 09/12/2022 1243   HCT 37.1 (L) 09/12/2022 1243   PLT 284 09/12/2022 1243   MCV 88 09/12/2022 1243   MCH 29.8 09/12/2022 1243   MCH 27.2 06/08/2014 1710   MCHC 33.7 09/12/2022 1243   MCHC 31.3 (A) 06/08/2014 1710   MCHC 34.6 01/18/2007 1053   RDW 14.2 09/12/2022 1243   LYMPHSABS 2.1 09/12/2022 1243   MONOABS 0.4 01/18/2007 1053   EOSABS 0.2 09/12/2022 1243   BASOSABS 0.1 09/12/2022 1243    BMET    Component Value Date/Time   NA 140 03/16/2023 1245   K 4.5 03/16/2023 1245   CL 101 03/16/2023 1245   CO2 24 03/16/2023 1245   GLUCOSE 100 (H)  03/16/2023 1245   GLUCOSE 101  (H) 10/15/2012 0854   BUN 13 03/16/2023 1245   CREATININE 0.99 03/16/2023 1245   CREATININE 1.40 (H) 10/15/2012 0854   CALCIUM  9.5 03/16/2023 1245   GFRNONAA 91 09/27/2019 1648   GFRNONAA 53 (L) 10/15/2012 0854   GFRAA 105 09/27/2019 1648   GFRAA 61 10/15/2012 0854    BNP No results found for: "BNP"  ProBNP No results found for: "PROBNP"  Imaging: No results found.   Assessment & Plan:   1. Obstructive sleep apnea (Primary) - Ambulatory Referral for DME  Assessment and Plan    Severe Obstructive Sleep Apnea Severe obstructive sleep apnea diagnosed in 2019. He reports issues with CPAP use, including nasal congestion and potential mask leakage. Alternative treatments discussed include oral appliances and the Inspire device. Oral appliances are less effective for severe cases and are costly without insurance coverage. The Inspire device, a surgical option that stimulates the hypoglossal nerve to prevent airway obstruction. However, he prefers to avoid surgical interventions and is committed to resuming CPAP use with new supplies and plans to increase usage. - Renew CPAP supplies with DME, [facility]. - Encourage nightly CPAP use for 4-6 hours. - Recommend follow-up in 3 months for compliance check.  Antonio Baumgarten, NP 07/13/2023

## 2023-07-13 NOTE — Patient Instructions (Addendum)
 -  SEVERE OBSTRUCTIVE SLEEP APNEA: Severe obstructive sleep apnea is a condition where your airway becomes blocked during sleep, causing breathing interruptions. The CPAP machine helps keep your airway open. You experienced nasal congestion and sinus infections, likely due to old equipment. With new supplies, you should continue using the CPAP for 4-6 hours each night. We also discussed the Inspire device, a surgical option, but you prefer to avoid surgery. Other option is oral appliance, if you decide you want to proceed with this let me know and I will place a referral. We'll check your progress in 3 months.  INSTRUCTIONS: Please continue using your CPAP machine nightly for 4-6 hours. We will follow up in 3 months to check on your progress and compliance. If you have any issues or questions, feel free to contact us .  Oral appliance Dr. Adelaide Holy- [phone] Dr. Kalman Ores - [phone]  Orders: Renew CPAP supplies  Follow-up 3 months with Beth NP for CPAP compliance check

## 2023-07-22 ENCOUNTER — Telehealth: Payer: Self-pay | Admitting: Pharmacist

## 2023-07-22 DIAGNOSIS — E1165 Type 2 diabetes mellitus with hyperglycemia: Secondary | ICD-10-CM

## 2023-07-22 MED ORDER — METFORMIN HCL 500 MG PO TABS: 500.0000 mg | ORAL_TABLET | Freq: Two times a day (BID) | ORAL | 0 refills | Status: DC

## 2023-07-22 NOTE — Telephone Encounter (Signed)
   This patient is appearing on a report for being at risk of failing the adherence measure for diabetes medications this calendar year.   Medication: metformin  Last fill date: 06/18/23 for 30 day supply  Contacted pharmacy to facilitate refills. and Will collaborate with provider to facilitate refill needs.   [name] [name], PharmD, BCACP, CPP Clinical Pharmacist, [facility]

## 2023-08-12 DIAGNOSIS — G4733 Obstructive sleep apnea (adult) (pediatric): Secondary | ICD-10-CM | POA: Diagnosis not present

## 2023-09-08 ENCOUNTER — Other Ambulatory Visit: Payer: Self-pay | Admitting: Family

## 2023-09-12 DIAGNOSIS — G4733 Obstructive sleep apnea (adult) (pediatric): Secondary | ICD-10-CM | POA: Diagnosis not present

## 2023-10-02 ENCOUNTER — Other Ambulatory Visit

## 2023-10-02 DIAGNOSIS — E781 Pure hyperglyceridemia: Secondary | ICD-10-CM | POA: Diagnosis not present

## 2023-10-03 ENCOUNTER — Ambulatory Visit: Payer: Self-pay | Admitting: Internal Medicine

## 2023-10-03 LAB — LIPID PANEL
Chol/HDL Ratio: 8.3 ratio — ABNORMAL HIGH (ref 0.0–5.0)
Cholesterol, Total: 241 mg/dL — ABNORMAL HIGH (ref 100–199)
HDL: 29 mg/dL — ABNORMAL LOW (ref >39)
LDL Chol Calc (NIH): 93 mg/dL (ref 0–99)
Triglycerides: 712 mg/dL (ref 0–149)
VLDL Cholesterol Cal: 119 mg/dL — ABNORMAL HIGH (ref 5–40)

## 2023-10-07 ENCOUNTER — Other Ambulatory Visit: Payer: Self-pay | Admitting: Family

## 2023-10-07 ENCOUNTER — Encounter: Payer: Self-pay | Admitting: Primary Care

## 2023-10-07 ENCOUNTER — Ambulatory Visit: Admitting: Primary Care

## 2023-10-07 VITALS — BP 124/76 | HR 64 | Temp 97.8°F | Ht 75.0 in | Wt 211.8 lb

## 2023-10-07 DIAGNOSIS — J302 Other seasonal allergic rhinitis: Secondary | ICD-10-CM | POA: Diagnosis not present

## 2023-10-07 DIAGNOSIS — G4733 Obstructive sleep apnea (adult) (pediatric): Secondary | ICD-10-CM

## 2023-10-07 DIAGNOSIS — I2510 Atherosclerotic heart disease of native coronary artery without angina pectoris: Secondary | ICD-10-CM

## 2023-10-07 NOTE — Patient Instructions (Addendum)
    YOUR PLAN: -OBSTRUCTIVE SLEEP APNEA: Obstructive sleep apnea is a condition where the airway becomes blocked during sleep, causing breathing to stop and start repeatedly. You have been using your CPAP machine regularly, which is helping to control your apnea. We will continue with your current CPAP settings and order a hybrid full face mask to address your discomfort and potential eye irritation. Please contact us  if you experience any air leaks, discomfort, or issues tolerating the CPAP. We will schedule a follow-up in one year unless any issues arise.  INSTRUCTIONS: Please contact your provider if you experience any air leaks, discomfort, or issues tolerating the CPAP. A follow-up appointment is scheduled for one year from now unless any issues arise.  Follow-up 1 year with Beth Np for CPAP compliance

## 2023-10-07 NOTE — Progress Notes (Signed)
 @Patient  ID: [name]., male    DOB: 04-23-1950, 73 y.o.   MRN: [id number]  Chief Complaint  Patient presents with   Obstructive Sleep Apnea    CPAP- Pt states he has been doing better     Referring provider: Lavell Bari LABOR, FNP  HPI: 73 year old male, former smoker. PMH significant for hypothyroidism, hyperlipidemia, CAD and GERD. Patient of Dr. Neda.   Previous [facility] pulmonary encounter  05/20/2022 Patient presents today for overdue follow-up OSA. HST 10/04/2017 showed severe OSA, AHI 50/hour with SpO2 low 66%. He stopped consistently wearing CPAP a couple years back, he had some difficulty tolerating but is open to resuming. CPAP machine is 48-40 years old. He is not currently established with DME company to receive CPAP supplies. He recently purchased new tubing for CPAP out of pocket. Previous DME was [facility].   Obstructive sleep apnea - Sleep study in 2019 showed severe obstructive sleep apnea, AHI 50/hour apneic events an hour with a low oxygen level of 66%.  Patient was previously on CPAP but stopped wearing several years ago.  He is open to resuming use.  Unclear if current machine is in working order, he is likely due for replacement in the next month or two.  Reviewed risks of untreated sleep apnea including cardiac arrhythmias, stroke, pulmonary hypertension and diabetes. Due to break in therapy we will need to repeat sleep study, recommending getting in lab study with titration study to determine correct pressure settings and supplemental oxygen need.  After completing sleep study we can place orders for either new CPAP machine and/or CPAP supplies.   10/16/2022 Patient presents today for OSA follow-up. HST 10/04/2017 showed severe OSA, AHI 50/hour with SpO2 low 66%. He stopped consistently wearing CPAP a couple years back, he had some difficulty tolerating but is open to resuming.   He has been told that he snores. Sleep is restless. Associated daytime fatigue. NSPG 08/01/22  >> AHI 37.1. Reviewed sleep study results and treatment options. Patient is open to resuming auto CPAP   01/12/2023 - HST 10/04/2017 showed severe OSA, AHI 50/hour with SpO2 low 66%. - NSPG 08/01/22 >> AHI 37.1/hour. Patient open to resuming auto CPAP.   Discussed the use of AI scribe software for clinical note transcription with the patient, who gave verbal consent to proceed.  History of Present Illness   Hx sleep apnea, he was started on CPAP therapy in September and presents today for compliance check in. He reports an average usage of around four hours per night, although this has been slightly disrupted recently due to a cough. The cough, which has now mostly resolved, prevented the patient from using the CPAP machine for approximately five nights. Despite this, the patient reports some benefits from using the CPAP machine, including reduced daytime lethargy.  The patient experienced some initial confusion regarding the operation of the CPAP machine, leading to a delay in usage for about seven days. However, he now feels he has a good handle on the device. The patient uses a full face mask with the CPAP machine, which he reports is comfortable and does not cause any issues.  The patient's CPAP machine is set to auto settings 5-20cm h20. The apnea score has significantly improved. The patient has noticed some air leaks, which he attributes to needing to change the mask. He reports having received a bill for new supplies but has not yet received them.  Overall, the patient is doing reasonably well with the  CPAP machine and has noticed improvements in his symptoms. He has declined the option of an Inspire surgical procedure at this time, preferring to continue with the CPAP treatment.      Airview download 12/08/22-01/06/23 97% compliant with use last 30 days  Median usage days used 4 hours 17 minutes Pressure 5 to 20 cm H2O (14.3 cm H2O-95%) Airleaks 24.8 L/min (95%) AHI 0.8   07/13/2023-  Interim hx Discussed the use of AI scribe software for clinical note transcription with the patient, who gave verbal consent to proceed.  History of Present Illness   [name]. is a 73 year old male with severe sleep apnea who presents with issues related to CPAP use.  He has a history of severe sleep apnea, diagnosed in 2019 with a sleep study showing an average of 50 apneic events per hour. He has been using a CPAP machine as treatment. Recently, he experienced significant nasal congestion and sinus infections, which he attributes to CPAP use. Approximately three to four weeks ago, he stopped using the CPAP due to severe nasal infection symptoms, which have since resolved after discontinuing the device.  He received new CPAP supplies recently, including a new tube, mask, and filters, but experienced issues with the ordering process. He has resumed CPAP use over the past few days, averaging over five hours of use per night, which he considers an improvement. He suspects the previous equipment was leaking air due to wear and tear, contributing to his symptoms.  He has experienced two episodes of eye issues, described as 'droopy eye,' occurring six months apart, which he associates with CPAP use. The first episode was diagnosed as cellulitis. He notes pressure from the CPAP mask on his face, leading to imprints and potential air leaks that may dry out his eyes.  He has explored alternative treatments, such as an oral appliance. He has also considered using a mouthpiece in the past, which he felt improved his sleep quality and reduced teeth grinding.     Airview download 04/11/23-07/09/23 Usage 46/90 days (51%); 33 days (37%) Average usage days used 4 hours 20 mins Pressure 5-20cm h20 (15.7cm h2-95%) Airleaks 50L/min (95%) AHI 0.9   10/07/2023- Interim hx  Discussed the use of AI scribe software for clinical note transcription with the patient, who gave verbal consent to  proceed.  History of Present Illness [name]. is a 73 year old male with severe sleep apnea who presents for follow-up regarding CPAP use.  He has severe sleep apnea diagnosed in 2019 and is currently using a CPAP machine with pressure settings of 5 to 20. He uses the CPAP every night, averaging about 4 hours and 20 minutes per night. He notes some air leaks with the CPAP. He experienced a period of non-use due to illness, which he believes was food poisoning.  He has received new supplies for the CPAP and is generally happy to continue its use. He sometimes experiences eye irritation, which he suspects might be related to the CPAP mask, as he has been diagnosed with cellulitis to both under eyes in the past. He sleeps longer with the CPAP on, sometimes up to 4 or 5 hours without waking up, and feels better when using it.  He dislikes the full-face mask, but acknowledges that it helps with mouth and throat snoring. He is open to trying a hybrid mask that might be more comfortable.  No excessive fatigue or tiredness related to sleep apnea. He does not  know if he snores but believes he might on occasion.  Airview download 09/06/23-10/05/23 Usage 30/30 days (100%) Average usage 4 hours 20 mins Pressure 5-20cm h20 Airleaks 50L/min  AHI 0.8  Allergies  Allergen Reactions   Imdur [Isosorbide Nitrate]     Headache    Lipitor [Atorvastatin] Other (See Comments)    myalgias    Immunization History  Administered Date(s) Administered   Influenza,inj,Quad PF,6+ Mos 01/20/2022   Tdap 12/08/2007    Past Medical History:  Diagnosis Date   Angina at rest [facility])    Benign neoplasm of colon    CAD (coronary artery disease)     Occluded RCA, 50% Diat 2006, 2008   Diverticulosis of colon (without mention of hemorrhage)    Esophageal reflux    Other and unspecified hyperlipidemia    Unspecified disorder of thyroid      Tobacco History: Social History   Tobacco Use  Smoking  Status Former   Current packs/day: 0.00   Types: Cigarettes   Quit date: 07/22/1986   Years since quitting: 37.2  Smokeless Tobacco Never   Counseling given: Not Answered   Outpatient Medications Prior to Visit  Medication Sig Dispense Refill   aspirin 81 MG EC tablet Take 81 mg by mouth daily.     Bempedoic Acid-Ezetimibe  (NEXLIZET ) 180-10 MG TABS TAKE ONE (1) TABLET BY MOUTH EVERY DAY 30 tablet 5   cetirizine  (ZYRTEC  ALLERGY ) 10 MG tablet Take 1 tablet (10 mg total) by mouth daily. 90 tablet 1   fenofibrate  (TRICOR ) 145 MG tablet Take 1 tablet (145 mg total) by mouth daily. 30 tablet 5   fluticasone  (FLONASE ) 50 MCG/ACT nasal spray Place 2 sprays into both nostrils daily. 16 g 6   icosapent  Ethyl (VASCEPA ) 1 g capsule Take 2 capsules (2 g total) by mouth 2 (two) times daily. 120 capsule 5   levothyroxine  (SYNTHROID ) 112 MCG tablet TAKE ONE (1) TABLET BY MOUTH EVERY DAY 30 tablet 5   metFORMIN  (GLUCOPHAGE ) 500 MG tablet Take 1 tablet (500 mg total) by mouth 2 (two) times daily with a meal. 200 tablet 0   neomycin-polymyxin b-dexamethasone  (MAXITROL) 3.5-10000-0.1 OINT      omeprazole  (PRILOSEC ) 20 MG capsule TAKE 2 CAPSULES BY MOUTH DAILY 180 capsule 0   ranolazine  (RANEXA ) 500 MG 12 hr tablet TAKE ONE (1) TABLET BY MOUTH TWO (2) TIMES DAILY 60 tablet 4   Vitamin D , Ergocalciferol , (DRISDOL ) 1.25 MG (50000 UNIT) CAPS capsule TAKE 1 CAPSULE BY MOUTH EVERY 7 DAYS 12 capsule 1   No facility-administered medications prior to visit.    Review of Systems  Review of Systems  Constitutional: Negative.   Respiratory: Negative.    Cardiovascular: Negative.     Physical Exam  BP 124/76   Pulse 64   Temp 97.8 F (36.6 C)   Ht 6' 3 (1.905 m)   Wt 211 lb 12.8 oz (96.1 kg)   SpO2 97% Comment: RA  BMI 26.47 kg/m  Physical Exam Constitutional:      General: He is not in acute distress.    Appearance: Normal appearance. He is not ill-appearing.  HENT:     Head: Normocephalic and  atraumatic.  Cardiovascular:     Rate and Rhythm: Normal rate and regular rhythm.  Pulmonary:     Effort: Pulmonary effort is normal.     Breath sounds: Normal breath sounds. No wheezing or rhonchi.  Skin:    General: Skin is warm and dry.  Neurological:  General: No focal deficit present.     Mental Status: He is alert and oriented to person, place, and time. Mental status is at baseline.  Psychiatric:        Mood and Affect: Mood normal.        Behavior: Behavior normal.        Thought Content: Thought content normal.        Judgment: Judgment normal.      Lab Results:  CBC    Component Value Date/Time   WBC 5.0 09/12/2022 1243   WBC 6.1 06/08/2014 1710   WBC 5.2 01/18/2007 1053   RBC 4.20 09/12/2022 1243   RBC 5.00 06/08/2014 1710   RBC 4.43 01/18/2007 1053   HGB 12.5 (L) 09/12/2022 1243   HCT 37.1 (L) 09/12/2022 1243   PLT 284 09/12/2022 1243   MCV 88 09/12/2022 1243   MCH 29.8 09/12/2022 1243   MCH 27.2 06/08/2014 1710   MCHC 33.7 09/12/2022 1243   MCHC 31.3 (A) 06/08/2014 1710   MCHC 34.6 01/18/2007 1053   RDW 14.2 09/12/2022 1243   LYMPHSABS 2.1 09/12/2022 1243   MONOABS 0.4 01/18/2007 1053   EOSABS 0.2 09/12/2022 1243   BASOSABS 0.1 09/12/2022 1243    BMET    Component Value Date/Time   NA 140 03/16/2023 1245   K 4.5 03/16/2023 1245   CL 101 03/16/2023 1245   CO2 24 03/16/2023 1245   GLUCOSE 100 (H) 03/16/2023 1245   GLUCOSE 101 (H) 10/15/2012 0854   BUN 13 03/16/2023 1245   CREATININE 0.99 03/16/2023 1245   CREATININE 1.40 (H) 10/15/2012 0854   CALCIUM  9.5 03/16/2023 1245   GFRNONAA 91 09/27/2019 1648   GFRNONAA 53 (L) 10/15/2012 0854   GFRAA 105 09/27/2019 1648   GFRAA 61 10/15/2012 0854    BNP No results found for: BNP  ProBNP No results found for: PROBNP  Imaging: No results found.   Assessment & Plan:   1. Obstructive sleep apnea (Primary) - Ambulatory Referral for DME  2. Seasonal allergic rhinitis, unspecified  trigger - RESPIRATORY ALLERGY  [place] II W/ RFLX: [place]   Assessment and Plan Assessment & Plan Obstructive sleep apnea Severe obstructive sleep apnea diagnosed in 2019. Improved compliance with CPAP, averaging 4 hours and 20 minutes per night. Pressure settings are 5 to 20. Apnea score is well controlled despite some air leaks. Prefers to continue CPAP over surgical interventions. Recent eye irritation and cellulitis possibly related to mask fit. - Continue CPAP therapy with current settings. - Order hybrid full face mask to address mask discomfort and potential eye irritation. - Instruct to contact our office if experiencing air leaks, discomfort, or issues tolerating CPAP. - Schedule follow-up in one year unless issues arise.  Allergic rhinitis - Continue flonase , check respiratory allergy  panel    Almarie LELON Ferrari, NP 10/07/2023

## 2023-10-13 ENCOUNTER — Other Ambulatory Visit: Payer: Self-pay | Admitting: Family

## 2023-10-13 DIAGNOSIS — J0101 Acute recurrent maxillary sinusitis: Secondary | ICD-10-CM

## 2023-10-13 DIAGNOSIS — E1165 Type 2 diabetes mellitus with hyperglycemia: Secondary | ICD-10-CM

## 2023-10-13 LAB — RESPIRATORY ALLERGY PANEL REGION II W/ RFLX: ~~LOC~~
Allergen, A. alternata, m6: <0.1 kU/L
Allergen, Cedar tree, t12: 0.27 kU/L — ABNORMAL HIGH
Allergen, Comm Silver Birch, t9: 0.27 kU/L — ABNORMAL HIGH
Allergen, Cottonwood, t14: 0.43 kU/L — ABNORMAL HIGH
Allergen, D pternoyssinus,d7: <0.1 kU/L
Allergen, Mouse Urine Protein, e78: <0.1 kU/L
Allergen, Mulberry, t76: 0.31 kU/L — ABNORMAL HIGH
Allergen, Oak,t7: 0.47 kU/L — ABNORMAL HIGH
Allergen, P. notatum, m1: <0.1 kU/L
Aspergillus fumigatus, m3: <0.1 kU/L
Bermuda Grass: 0.58 kU/L — ABNORMAL HIGH
Box Elder IgE: 0.51 kU/L — ABNORMAL HIGH
CLADOSPORIUM HERBARUM (M2) IGE: <0.1 kU/L
COMMON RAGWEED (SHORT) (W1) IGE: 0.44 kU/L — ABNORMAL HIGH
Cat Dander: <0.1 kU/L
Class: 0
Class: 0
Class: 0
Class: 0
Class: 0
Class: 0
Class: 0
Class: 0
Class: 1
Class: 1
Class: 1
Class: 1
Class: 1
Class: 1
Class: 1
Class: 1
Class: 1
Class: 1
Class: 1
Cockroach: 0.4 kU/L — ABNORMAL HIGH
D. farinae: <0.1 kU/L
Dog Dander: 0.12 kU/L — ABNORMAL HIGH
Elm IgE: 0.5 kU/L — ABNORMAL HIGH
IgE (Immunoglobulin E), Serum: 164 kU/L — ABNORMAL HIGH (ref <=114)
Johnson Grass: 0.55 kU/L — ABNORMAL HIGH
Pecan/Hickory Tree IgE: 0.3 kU/L — ABNORMAL HIGH
Rough Pigweed  IgE: 0.36 kU/L — ABNORMAL HIGH
Sheep Sorrel IgE: 0.48 kU/L — ABNORMAL HIGH
Timothy Grass: 0.6 kU/L — ABNORMAL HIGH

## 2023-10-13 LAB — DOG DANDER COMPONENT
Can f 4(e229) IgE: <0.1 kU/L (ref <0.10)
Can f 6(e230) IgE: <0.1 kU/L (ref <0.10)
E101-IgE Can f 1: <0.1 kU/L (ref <0.10)
E102-IgE Can f 2: <0.1 kU/L (ref <0.10)
E221-IgE Can f 3: <0.1 kU/L (ref <0.10)
E226-IgE Can f 5: <0.1 kU/L (ref <0.10)

## 2023-10-13 LAB — INTERPRETATION:

## 2023-10-13 MED ORDER — FENOFIBRATE 160 MG PO TABS: 160.0000 mg | ORAL_TABLET | Freq: Every day | ORAL | 3 refills | Status: AC

## 2023-10-15 ENCOUNTER — Ambulatory Visit: Payer: Self-pay | Admitting: Primary Care

## 2023-10-21 ENCOUNTER — Other Ambulatory Visit: Payer: Self-pay | Admitting: Family

## 2023-10-21 DIAGNOSIS — K219 Gastro-esophageal reflux disease without esophagitis: Secondary | ICD-10-CM

## 2023-10-28 NOTE — Progress Notes (Unsigned)
 Cardiology Office Note   Date:  10/29/2023  ID:  [name]., DOB [birth year], MRN [id number] PCP: Lavell Bari LABOR, FNP  [facility] Providers Cardiologist:  None { Click to update primary MD,subspecialty MD or APP then REFRESH:1}    PMH Dyslipidemia Statin intolerance Aortic atherosclerosis Coronary artery disease Hypothyroidism OSA on CPAP  Referred to [facility] Lip[facility] Disorders clinic and seen by Dr. Mona 05/01/2022.  He has been seen by Dr. Edith and Dr. Lavona for general cardiology.  Known coronary artery disease including an occluded RCA and additional disease by cath.  Has a long history of high triglycerides and unfortunately could not tolerate Lipitor or low-dose rosuvastatin .  Triglycerides have been as high as in the thousands.  Recent lipid panel with total cholesterol 295, triglycerides 1371, and HDL 15.  LDL was not calculated.  He was on fenofibrate  145 mg daily and Nexlizet  180/10 mg daily.  He was trying to avoid saturated fat in his diet.  Vascepa  2 g twice daily was tolerated.  Repeat labs 04/2022 with total cholesterol 286 down from 384.  Triglycerides 1289, HDL 14, and LDL could not be calculated.  APO B was obtained and was 175, improved from 213 about 3 months prior.  He reported inconsistent diet with sometimes going long periods of time without eating.  No history or symptoms of pancreatitis.  Last clinic visit was 05/05/2023 with Dr. Mona.  He was having left shoulder plain.  Triglycerides were elevated at 676, total cholesterol 244, HDL 29, and LDL 100.  A1c was excellent at 5.6 on metformin .  He inquired about PCSK9 inhibitors but was not interested in injections.  Dr. Mona discussed clinical trial options including a new APO C3 inhibitor from [employer].  He may qualify for this trial.  Lipid panel 10/02/2023 with total cholesterol 241, triglycerides 712, HDL 29, LDL-C 93.  He was advised to increase fenofibrate  to 160 mg  daily.  History of Present Illness [name]. is a 73 y.o. male ***  Retired Customer service manager, community work Has not taken 160, but resumed 145  Feels poorly without Ranexa  Was not fasting  Helps his mother who is 22 No formal exercise   ROS: SeeHPI   Studies Reviewed EKG Interpretation Date/Time:  Thursday October 29 2023 14:23:40 EDT Ventricular Rate:  69 PR Interval:  146 QRS Duration:  92 QT Interval:  388 QTC Calculation: 415 R Axis:   -65  Text Interpretation: Normal sinus rhythm Left anterior fascicular block No previous ECGs available Confirmed by Percy Browning [phone]) on 10/29/2023 2:52:09 PM     Lipoprotein (a)  Date/Time Value Ref Range Status  01/20/2022 11:44 AM CANCELED nmol/L     Comment:    Test not performed. Specimen is grossly lipemic. Note:  Values greater than or equal to 75.0 nmol/L may        indicate an independent risk factor for CHD,        but must be evaluated with caution when applied        to non-Caucasian populations due to the        influence of genetic factors on Lp(a) across        ethnicities.  Result canceled by the ancillary.     Risk Assessment/Calculations           Physical Exam VS:  BP 124/64 (BP Location: Left Arm, Patient Position: Sitting, Cuff Size: Normal)   Pulse 69   Ht 6' 3 (  1.905 m)   Wt 211 lb 4.8 oz (95.8 kg)   SpO2 97%   BMI 26.41 kg/m    Wt Readings from Last 3 Encounters:  10/29/23 211 lb 4.8 oz (95.8 kg)  10/07/23 211 lb 12.8 oz (96.1 kg)  07/13/23 209 lb 9.6 oz (95.1 kg)    GEN: Well nourished, well developed in no acute distress NECK: No JVD; No carotid bruits CARDIAC: RRR, no murmurs, rubs, gallops RESPIRATORY:  Clear to auscultation without rales, wheezing or rhonchi  ABDOMEN: Soft, non-tender, non-distended EXTREMITIES:  No edema; No deformity   ASSESSMENT AND PLAN ***    {Are you ordering a CV Procedure (e.g. stress test, cath, DCCV, TEE, etc)?   Press F2         :[id number]}  Dispo: ***  Signed, Rosaline Bane, NP-C

## 2023-10-29 ENCOUNTER — Ambulatory Visit (HOSPITAL_BASED_OUTPATIENT_CLINIC_OR_DEPARTMENT_OTHER): Admitting: Nurse Practitioner

## 2023-10-29 ENCOUNTER — Encounter (HOSPITAL_BASED_OUTPATIENT_CLINIC_OR_DEPARTMENT_OTHER): Payer: Self-pay | Admitting: Nurse Practitioner

## 2023-10-29 VITALS — BP 124/64 | HR 69 | Ht 75.0 in | Wt 211.3 lb

## 2023-10-29 DIAGNOSIS — E781 Pure hyperglyceridemia: Secondary | ICD-10-CM | POA: Diagnosis not present

## 2023-10-29 DIAGNOSIS — Z79899 Other long term (current) drug therapy: Secondary | ICD-10-CM

## 2023-10-29 DIAGNOSIS — E785 Hyperlipidemia, unspecified: Secondary | ICD-10-CM

## 2023-10-29 DIAGNOSIS — I25118 Atherosclerotic heart disease of native coronary artery with other forms of angina pectoris: Secondary | ICD-10-CM | POA: Diagnosis not present

## 2023-10-29 DIAGNOSIS — Z5181 Encounter for therapeutic drug level monitoring: Secondary | ICD-10-CM

## 2023-10-29 NOTE — Patient Instructions (Addendum)
 Medication Instructions:  Your physician recommends that you continue on your current medications as directed. Please refer to the Current Medication list given to you today.  *If you need a refill on your cardiac medications before your next appointment, please call your pharmacy*  Lab Work: FASTING LP/ALT IN 3 MONTHS ABOUT A WEEK PRIOR TO FOLLOW UP   If you have labs (blood work) drawn today and your tests are completely normal, you will receive your results only by: MyChart Message (if you have MyChart) OR A paper copy in the mail If you have any lab test that is abnormal or we need to change your treatment, we will call you to review the results.  Testing/Procedures: NONE  Follow-Up: 01/27/2024 3:10 pm with Rosaline RAMAN NP   We recommend signing up for the patient portal called MyChart.  Sign up information is provided on this After Visit Summary.  MyChart is used to connect with patients for Virtual Visits (Telemedicine).  Patients are able to view lab/test results, encounter notes, upcoming appointments, etc.  Non-urgent messages can be sent to your provider as well.   To learn more about what you can do with MyChart, go to [email or web address].   Other Instructions REMEMBER TO TAKE SYNTHROID  30 MINUTES BEFORE OTHER MEDICATIONS

## 2023-10-30 ENCOUNTER — Encounter (HOSPITAL_BASED_OUTPATIENT_CLINIC_OR_DEPARTMENT_OTHER): Admitting: Internal Medicine

## 2023-10-30 ENCOUNTER — Encounter (HOSPITAL_BASED_OUTPATIENT_CLINIC_OR_DEPARTMENT_OTHER): Payer: Self-pay | Admitting: Nurse Practitioner

## 2023-11-02 ENCOUNTER — Ambulatory Visit (INDEPENDENT_AMBULATORY_CARE_PROVIDER_SITE_OTHER): Payer: Medicare HMO

## 2023-11-02 ENCOUNTER — Other Ambulatory Visit: Payer: Self-pay | Admitting: Family

## 2023-11-02 VITALS — BP 124/64 | HR 69 | Ht 75.0 in | Wt 211.0 lb

## 2023-11-02 DIAGNOSIS — Z0000 Encounter for general adult medical examination without abnormal findings: Secondary | ICD-10-CM

## 2023-11-02 DIAGNOSIS — E782 Mixed hyperlipidemia: Secondary | ICD-10-CM

## 2023-11-02 NOTE — Progress Notes (Signed)
 Subjective:   [name]. is a 73 y.o. who presents for a Medicare Wellness preventive visit.  As a reminder, Annual Wellness Visits don't include a physical exam, and some assessments may be limited, especially if this visit is performed virtually. We may recommend an in-person follow-up visit with your provider if needed.  Visit Complete: Virtual I connected with  [name]. on 11/02/23 by a audio enabled telemedicine application and verified that I am speaking with the correct person using two identifiers.  Patient Location: Home  Provider Location: Home Office  I discussed the limitations of evaluation and management by telemedicine. The patient expressed understanding and agreed to proceed.  Vital Signs: Because this visit was a virtual/telehealth visit, some criteria may be missing or patient reported. Any vitals not documented were not able to be obtained and vitals that have been documented are patient reported.  VideoDeclined- This patient declined Librarian, academic. Therefore the visit was completed with audio only.  Persons Participating in Visit: Patient.  AWV Questionnaire: No: Patient Medicare AWV questionnaire was not completed prior to this visit.  Cardiac Risk Factors include: advanced age (>28men, >28 women);diabetes mellitus;dyslipidemia;male gender;smoking/ tobacco exposure     Objective:    Today's Vitals   11/02/23 1353  BP: 124/64  Pulse: 69  Weight: 211 lb (95.7 kg)  Height: 6' 3 (1.905 m)   Body mass index is 26.37 kg/m.     11/02/2023    2:00 PM 10/31/2022    3:00 PM 08/01/2022   10:00 PM 02/19/2022    1:00 PM 10/16/2021    2:48 PM 10/12/2020    3:49 PM 10/07/2019   10:47 AM  Advanced Directives  Does Patient Have a Medical Advance Directive? Yes Yes No No Yes No;Yes Yes  Type of Estate agent of [place];Living will Healthcare Power of [place];Living will   Healthcare Power of  [employer] of [place];Living will;Out of facility DNR (pink MOST or yellow form)  Does patient want to make changes to medical advance directive?     No - Patient declined    Copy of Healthcare Power of Attorney in Chart? No - copy requested No - copy requested   No - copy requested  No - copy requested  Would patient like information on creating a medical advance directive?   No - Patient declined Yes (MAU/Ambulatory/Procedural Areas - Information given)       Current Medications (verified) Outpatient Encounter Medications as of 11/02/2023  Medication Sig   aspirin 81 MG EC tablet Take 81 mg by mouth daily.   Bempedoic Acid-Ezetimibe  (NEXLIZET ) 180-10 MG TABS TAKE ONE (1) TABLET BY MOUTH EVERY DAY   cetirizine  (ZYRTEC  ALLERGY ) 10 MG tablet Take 1 tablet (10 mg total) by mouth daily. (Patient taking differently: Take 10 mg by mouth as needed.)   fluticasone  (FLONASE ) 50 MCG/ACT nasal spray USE 2 SPRAYS IN EACH NOSTRIL DAILY   icosapent  Ethyl (VASCEPA ) 1 g capsule Take 2 capsules (2 g total) by mouth 2 (two) times daily.   levothyroxine  (SYNTHROID ) 112 MCG tablet TAKE ONE (1) TABLET BY MOUTH EVERY DAY   metFORMIN  (GLUCOPHAGE ) 500 MG tablet Take 1 tablet (500 mg total) by mouth 2 (two) times daily with a meal. **NEEDS TO BE SEEN BEFORE NEXT REFILL**   neomycin-polymyxin b-dexamethasone  (MAXITROL) 3.5-10000-0.1 OINT  (Patient taking differently: Place 1 Application into both eyes as needed.)   omeprazole  (PRILOSEC ) 20 MG capsule Take 2 capsules (  40 mg total) by mouth daily. **NEEDS TO BE SEEN BEFORE NEXT REFILL**   ranolazine  (RANEXA ) 500 MG 12 hr tablet TAKE ONE (1) TABLET BY MOUTH TWO (2) TIMES DAILY   Vitamin D , Ergocalciferol , (DRISDOL ) 1.25 MG (50000 UNIT) CAPS capsule TAKE 1 CAPSULE BY MOUTH EVERY 7 DAYS   fenofibrate  160 MG tablet Take 1 tablet (160 mg total) by mouth daily. (Patient not taking: Reported on 11/02/2023)   No facility-administered encounter medications on file  as of 11/02/2023.    Allergies (verified) Imdur [isosorbide nitrate] and Lipitor [atorvastatin]   History: Past Medical History:  Diagnosis Date   Angina at rest [facility])    Benign neoplasm of colon    CAD (coronary artery disease)     Occluded RCA, 50% Diat 2006, 2008   Diverticulosis of colon (without mention of hemorrhage)    Esophageal reflux    Other and unspecified hyperlipidemia    Unspecified disorder of thyroid     Past Surgical History:  Procedure Laterality Date   TONSILLECTOMY  1955   Family History  Problem Relation Age of Onset   Hyperlipidemia Father    Heart disease Father        pacemaker / CHF    Dementia Maternal Grandmother    Cancer Maternal Grandfather        throat   Alzheimer's disease Paternal Grandmother    Cancer Paternal Grandfather        lung   Cancer Sister        breast   Colon cancer Neg Hx    Social History   Socioeconomic History   Marital status: Widowed    Spouse name: Not on file   Number of children: 3   Years of education: 16   Highest education level: Bachelor's degree (e.g., BA, AB, BS)  Occupational History   Occupation: retired     Comment: golf / club prefessional   Tobacco Use   Smoking status: Former    Current packs/day: 0.00    Types: Cigarettes    Quit date: 07/22/1986    Years since quitting: 37.3   Smokeless tobacco: Never  Vaping Use   Vaping status: Never Used  Substance and Sexual Activity   Alcohol use: Not Currently   Drug use: No   Sexual activity: Not Currently  Other Topics Concern   Not on file  Social History Narrative   Retired, widowed, lives alone. 3 grown children. Enjoys golf.    Social Drivers of Health   Financial Resource Strain: Patient Unable To Answer (11/02/2023)   Overall Financial Resource Strain (CARDIA)    Difficulty of Paying Living Expenses: Patient unable to answer  Food Insecurity: No Food Insecurity (11/02/2023)   Hunger Vital Sign    Worried About Running Out of Food in  the Last Year: Never true    Ran Out of Food in the Last Year: Never true  Transportation Needs: No Transportation Needs (11/02/2023)   PRAPARE - Administrator, Civil Service (Medical): No    Lack of Transportation (Non-Medical): No  Physical Activity: Sufficiently Active (11/02/2023)   Exercise Vital Sign    Days of Exercise per Week: 7 days    Minutes of Exercise per Session: 30 min  Stress: No Stress Concern Present (11/02/2023)   [employer] of Occupational Health - Occupational Stress Questionnaire    Feeling of Stress: Not at all  Social Connections: Moderately Isolated (11/02/2023)   Social Connection and Isolation Panel    Frequency  of Communication with Friends and Family: More than three times a week    Frequency of Social Gatherings with Friends and Family: More than three times a week    Attends Religious Services: More than 4 times per year    Active Member of [employer] or Organizations: No    Attends Banker Meetings: Never    Marital Status: Widowed    Tobacco Counseling Counseling given: Yes    Clinical Intake:  Pre-visit preparation completed: Yes  Pain : No/denies pain     BMI - recorded: 26.37 Nutritional Status: BMI 25 -29 Overweight Nutritional Risks: None Diabetes: No  Lab Results  Component Value Date   HGBA1C 5.6 03/16/2023   HGBA1C 5.7 (H) 09/12/2022   HGBA1C 6.9 (H) 01/20/2022     How often do you need to have someone help you when you read instructions, pamphlets, or other written materials from your doctor or pharmacy?: 1 - Never  Interpreter Needed?: No  Information entered by :: alia t/cma   Activities of Daily Living     11/02/2023    1:56 PM  In your present state of health, do you have any difficulty performing the following activities:  Hearing? 0  Vision? 0  Difficulty concentrating or making decisions? 0  Walking or climbing stairs? 0  Dressing or bathing? 0  Doing errands, shopping? 0   Preparing Food and eating ? N  Using the Toilet? N  In the past six months, have you accidently leaked urine? N  Do you have problems with loss of bowel control? N  Managing your Medications? N  Managing your Finances? N  Housekeeping or managing your Housekeeping? N    Patient Care Team: Lavell Bari LABOR, FNP as PCP - General (Family Medicine) Pyrtle, Gordy HERO, MD as Consulting Physician (Gastroenterology) Neda Jennet LABOR, MD as Consulting Physician (Pulmonary Disease) Lavona Agent, MD as Consulting Physician (Cardiology) Billee Mliss BIRCH, [facility] as Pharmacist (Family Medicine) Camillo Golas, MD as Attending Physician (Ophthalmology) Swinyer, Rosaline HERO, NP as Nurse Practitioner (Cardiology)  I have updated your Care Teams any recent Medical Services you may have received from other providers in the past year.     Assessment:   This is a routine wellness examination for [facility].  Hearing/Vision screen No results found.   Goals Addressed   None    Depression Screen     11/02/2023    2:00 PM 10/31/2022    2:58 PM 10/17/2022    8:38 AM 02/19/2022   12:59 PM 01/20/2022   11:09 AM 09/17/2021   11:23 AM 12/24/2020    2:36 PM  PHQ 2/9 Scores  PHQ - 2 Score 0 0 0 0 0 0 0  PHQ- 9 Score  0 0  0 0     Fall Risk     11/02/2023    1:55 PM 07/13/2023    2:23 PM 01/12/2023    2:00 PM 10/31/2022    2:54 PM 02/19/2022   12:39 PM  Fall Risk   Falls in the past year? 0 0 0 0 0  Number falls in past yr: 0   0   Injury with Fall? 0   0   Risk for fall due to : No Fall Risks   No Fall Risks   Follow up Falls evaluation completed;Education provided   Falls prevention discussed     MEDICARE RISK AT HOME:  Medicare Risk at Home Any stairs in or around the home?: Yes If so,  are there any without handrails?: Yes Home free of loose throw rugs in walkways, pet beds, electrical cords, etc?: Yes Adequate lighting in your home to reduce risk of falls?: Yes Life alert?: No Use of a cane,  walker or w/c?: No Grab bars in the bathroom?: Yes Shower chair or bench in shower?: Yes Elevated toilet seat or a handicapped toilet?: No  TIMED UP AND GO:  Was the test performed?  No  Cognitive Function: 6CIT completed    09/28/2017   11:33 AM  MMSE - Mini Mental State Exam  Orientation to time 5  Orientation to Place 5  Registration 3  Attention/ Calculation 5  Recall 3  Language- name 2 objects 2  Language- repeat 1  Language- follow 3 step command 3  Language- read & follow direction 1  Write a sentence 1  Copy design 1  Total score 30        10/31/2022    3:00 PM 10/16/2021    2:51 PM 10/07/2019   10:50 AM 10/04/2018    2:40 PM  6CIT Screen  What Year? 0 points 0 points 0 points 0 points  What month? 0 points 0 points 0 points 0 points  What time? 0 points 0 points 0 points 0 points  Count back from 20 0 points 0 points 0 points 0 points  Months in reverse 0 points 0 points 0 points 0 points  Repeat phrase 0 points 0 points 0 points 0 points  Total Score 0 points 0 points 0 points 0 points    Immunizations Immunization History  Administered Date(s) Administered   Influenza,inj,Quad PF,6+ Mos 01/20/2022   Tdap 12/08/2007    Screening Tests Health Maintenance  Topic Date Due   Zoster Vaccines- Shingrix (1 of 2) Never done   Colonoscopy  02/15/2017   DTaP/Tdap/Td (2 - Td or Tdap) 12/07/2017   Diabetic kidney evaluation - Urine ACR  09/12/2023   HEMOGLOBIN A1C  09/13/2023   Medicare Annual Wellness (AWV)  10/31/2023   Diabetic kidney evaluation - eGFR measurement  03/15/2024   FOOT EXAM  03/15/2024   OPHTHALMOLOGY EXAM  04/12/2024   Hepatitis C Screening  Completed   HPV VACCINES  Aged Out   Meningococcal B Vaccine  Aged Out   Pneumococcal Vaccine: 50+ Years  Discontinued   Influenza Vaccine  Discontinued   COLON CANCER SCREENING ANNUAL FOBT  Discontinued   COVID-19 Vaccine  Discontinued    Health Maintenance Items Addressed: See Nurse Notes at  the end of this note  Additional Screening:  Vision Screening: Recommended annual ophthalmology exams for early detection of glaucoma and other disorders of the eye. Is the patient up to date with their annual eye exam?  Yes  Who is the provider or what is the name of the office in which the patient attends annual eye exams? Dr. Kennyth in [place]  Dental Screening: Recommended annual dental exams for proper oral hygiene  Community Resource Referral / Chronic Care Management: CRR required this visit?  No   CCM required this visit?  No   Plan:    I have personally reviewed and noted the following in the patient's chart:   Medical and social history Use of alcohol, tobacco or illicit drugs  Current medications and supplements including opioid prescriptions. Patient is not currently taking opioid prescriptions. Functional ability and status Nutritional status Physical activity Advanced directives List of other physicians Hospitalizations, surgeries, and ER visits in previous 12 months Vitals Screenings to  include cognitive, depression, and falls Referrals and appointments  In addition, I have reviewed and discussed with patient certain preventive protocols, quality metrics, and best practice recommendations. A written personalized care plan for preventive services as well as general preventive health recommendations were provided to patient.   Ozie Ned, CMA   11/02/2023   After Visit Summary: (MyChart) Due to this being a telephonic visit, the after visit summary with patients personalized plan was offered to patient via MyChart   Notes: PCP Follow Up Recommendations: Pt is aware and due the following: colonoscopy-ordered, DTAP/Shingles vaccine, UrineACR, A1c

## 2023-11-02 NOTE — Patient Instructions (Signed)
 Mr. [name],  Thank you for taking the time for your Medicare Wellness Visit. I appreciate your continued commitment to your health goals. Please review the care plan we discussed, and feel free to reach out if I can assist you further.  Medicare recommends these wellness visits once per year to help you and your care team stay ahead of potential health issues. These visits are designed to focus on prevention, allowing your provider to concentrate on managing your acute and chronic conditions during your regular appointments.  Please note that Annual Wellness Visits do not include a physical exam. Some assessments may be limited, especially if the visit was conducted virtually. If needed, we may recommend a separate in-person follow-up with your provider.  Ongoing Care Seeing your primary care provider every 3 to 6 months helps us  monitor your health and provide consistent, personalized care.   Referrals If a referral was made during today's visit and you haven't received any updates within two weeks, please contact the referred provider directly to check on the status.  Recommended Screenings:  Health Maintenance  Topic Date Due   Zoster (Shingles) Vaccine (1 of 2) Never done   Colon Cancer Screening  02/15/2017   DTaP/Tdap/Td vaccine (2 - Td or Tdap) 12/07/2017   Yearly kidney health urinalysis for diabetes  09/12/2023   Hemoglobin A1C  09/13/2023   Medicare Annual Wellness Visit  10/31/2023   Yearly kidney function blood test for diabetes  03/15/2024   Complete foot exam   03/15/2024   Eye exam for diabetics  04/12/2024   Hepatitis C Screening  Completed   HPV Vaccine  Aged Out   Meningitis B Vaccine  Aged Out   Pneumococcal Vaccine for age over 21  Discontinued   Flu Shot  Discontinued   Stool Blood Test  Discontinued   COVID-19 Vaccine  Discontinued       11/02/2023    2:00 PM  Advanced Directives  Does Patient Have a Medical Advance Directive? Yes  Type of Sports coach of [place];Living will  Copy of Healthcare Power of Attorney in Chart? No - copy requested   Advance Care Planning is important because it: Ensures you receive medical care that aligns with your values, goals, and preferences. Provides guidance to your family and loved ones, reducing the emotional burden of decision-making during critical moments.  Vision: Annual vision screenings are recommended for early detection of glaucoma, cataracts, and diabetic retinopathy. These exams can also reveal signs of chronic conditions such as diabetes and high blood pressure.  Dental: Annual dental screenings help detect early signs of oral cancer, gum disease, and other conditions linked to overall health, including heart disease and diabetes.  Please see the attached documents for additional preventive care recommendations.

## 2023-11-23 ENCOUNTER — Other Ambulatory Visit: Payer: Self-pay | Admitting: Family

## 2023-11-23 DIAGNOSIS — E782 Mixed hyperlipidemia: Secondary | ICD-10-CM

## 2023-11-24 NOTE — Telephone Encounter (Signed)
 Christy pt NTBS 30-d given 11/02/23

## 2023-11-24 NOTE — Telephone Encounter (Signed)
 Appt scheduled for 11/27/2023

## 2023-11-27 ENCOUNTER — Encounter: Payer: Self-pay | Admitting: Family

## 2023-11-27 ENCOUNTER — Ambulatory Visit (INDEPENDENT_AMBULATORY_CARE_PROVIDER_SITE_OTHER): Admitting: Family

## 2023-11-27 VITALS — BP 134/76 | HR 62 | Temp 97.6°F | Ht 75.0 in | Wt 213.0 lb

## 2023-11-27 DIAGNOSIS — E782 Mixed hyperlipidemia: Secondary | ICD-10-CM

## 2023-11-27 DIAGNOSIS — K219 Gastro-esophageal reflux disease without esophagitis: Secondary | ICD-10-CM

## 2023-11-27 DIAGNOSIS — Z0001 Encounter for general adult medical examination with abnormal findings: Secondary | ICD-10-CM | POA: Diagnosis not present

## 2023-11-27 DIAGNOSIS — E1165 Type 2 diabetes mellitus with hyperglycemia: Secondary | ICD-10-CM

## 2023-11-27 DIAGNOSIS — Z7984 Long term (current) use of oral hypoglycemic drugs: Secondary | ICD-10-CM

## 2023-11-27 DIAGNOSIS — M791 Myalgia, unspecified site: Secondary | ICD-10-CM | POA: Diagnosis not present

## 2023-11-27 DIAGNOSIS — T466X5A Adverse effect of antihyperlipidemic and antiarteriosclerotic drugs, initial encounter: Secondary | ICD-10-CM

## 2023-11-27 DIAGNOSIS — Z1211 Encounter for screening for malignant neoplasm of colon: Secondary | ICD-10-CM

## 2023-11-27 DIAGNOSIS — E559 Vitamin D deficiency, unspecified: Secondary | ICD-10-CM

## 2023-11-27 DIAGNOSIS — G4733 Obstructive sleep apnea (adult) (pediatric): Secondary | ICD-10-CM

## 2023-11-27 DIAGNOSIS — I700 Atherosclerosis of aorta: Secondary | ICD-10-CM | POA: Diagnosis not present

## 2023-11-27 DIAGNOSIS — I25111 Atherosclerotic heart disease of native coronary artery with angina pectoris with documented spasm: Secondary | ICD-10-CM

## 2023-11-27 DIAGNOSIS — E039 Hypothyroidism, unspecified: Secondary | ICD-10-CM

## 2023-11-27 DIAGNOSIS — Z0000 Encounter for general adult medical examination without abnormal findings: Secondary | ICD-10-CM

## 2023-11-27 MED ORDER — ICOSAPENT ETHYL 1 G PO CAPS: 2.0000 g | ORAL_CAPSULE | Freq: Two times a day (BID) | ORAL | 5 refills | Status: AC

## 2023-11-27 MED ORDER — OMEPRAZOLE 20 MG PO CPDR: 40.0000 mg | DELAYED_RELEASE_CAPSULE | Freq: Every day | ORAL | 2 refills | Status: AC

## 2023-11-27 MED ORDER — NEXLIZET 180-10 MG PO TABS: 1.0000 | ORAL_TABLET | Freq: Every day | ORAL | 3 refills | Status: AC

## 2023-11-27 NOTE — Progress Notes (Signed)
 Subjective:    Patient ID: Corey JINNY Blair Mickey., male    DOB: 26-Nov-1950, 73 y.o.   MRN: [id number]  Chief Complaint  Patient presents with   Medical Management of Chronic Issues   Pt presents to the office today for CPE.   He is followed by Cardiologists every 6 months for CAD.    He has OSA and using CPAP most nights. He is followed by [facility].    He has aortic atherosclerosis and CAD  but can not tolerate statin related to myalgia. Taking Nexlizet .   Gastroesophageal Reflux He complains of belching, heartburn and a hoarse voice. This is a chronic problem. The current episode started more than 1 year ago. The problem occurs rarely. The symptoms are aggravated by certain foods. Pertinent negatives include no fatigue. Risk factors include obesity. He has tried a PPI for the symptoms. The treatment provided moderate relief.  Diabetes He presents for his follow-up diabetic visit. He has type 2 diabetes mellitus. Pertinent negatives for diabetes include no blurred vision, no fatigue and no foot paresthesias. Symptoms are stable. Risk factors for coronary artery disease include dyslipidemia, diabetes mellitus, hypertension, sedentary lifestyle and male sex. He is following a generally healthy diet. (Does not check glucose at home) Eye exam is current.  Thyroid  Problem Presents for follow-up visit. Symptoms include hoarse voice. Patient reports no constipation, diarrhea, dry skin or fatigue. The symptoms have been stable.  Hyperlipidemia This is a chronic problem. The current episode started more than 1 year ago. The problem is uncontrolled. Recent lipid tests were reviewed and are high. Exacerbating diseases include obesity. Current antihyperlipidemic treatment includes herbal therapy and ezetimibe . The current treatment provides no improvement of lipids. Risk factors for coronary artery disease include dyslipidemia, hypertension, a sedentary lifestyle, male sex and obesity.      Review  of Systems  Constitutional:  Negative for fatigue.  HENT:  Positive for hoarse voice.   Eyes:  Negative for blurred vision.  Gastrointestinal:  Positive for heartburn. Negative for constipation and diarrhea.  All other systems reviewed and are negative.  Family History  Problem Relation Age of Onset   Hyperlipidemia Father    Heart disease Father        pacemaker / CHF    Dementia Maternal Grandmother    Cancer Maternal Grandfather        throat   Alzheimer's disease Paternal Grandmother    Cancer Paternal Grandfather        lung   Cancer Sister        breast   Colon cancer Neg Hx    Social History   Socioeconomic History   Marital status: Widowed    Spouse name: Not on file   Number of children: 3   Years of education: 16   Highest education level: Bachelor's degree (e.g., BA, AB, BS)  Occupational History   Occupation: retired     Comment: golf / club prefessional   Tobacco Use   Smoking status: Former    Current packs/day: 0.00    Types: Cigarettes    Quit date: 07/22/1986    Years since quitting: 37.3   Smokeless tobacco: Never  Vaping Use   Vaping status: Never Used  Substance and Sexual Activity   Alcohol use: Not Currently   Drug use: No   Sexual activity: Not Currently  Other Topics Concern   Not on file  Social History Narrative   Retired, widowed, lives alone. 3 grown children. Enjoys golf.  Social Drivers of Health   Financial Resource Strain: Patient Unable To Answer (11/02/2023)   Overall Financial Resource Strain (CARDIA)    Difficulty of Paying Living Expenses: Patient unable to answer  Food Insecurity: No Food Insecurity (11/02/2023)   Hunger Vital Sign    Worried About Running Out of Food in the Last Year: Never true    Ran Out of Food in the Last Year: Never true  Transportation Needs: No Transportation Needs (11/02/2023)   PRAPARE - Administrator, Civil Service (Medical): No    Lack of Transportation (Non-Medical): No   Physical Activity: Sufficiently Active (11/02/2023)   Exercise Vital Sign    Days of Exercise per Week: 7 days    Minutes of Exercise per Session: 30 min  Stress: No Stress Concern Present (11/02/2023)   [employer] of Occupational Health - Occupational Stress Questionnaire    Feeling of Stress: Not at all  Social Connections: Moderately Isolated (11/02/2023)   Social Connection and Isolation Panel    Frequency of Communication with Friends and Family: More than three times a week    Frequency of Social Gatherings with Friends and Family: More than three times a week    Attends Religious Services: More than 4 times per year    Active Member of [employer] or Organizations: No    Attends Banker Meetings: Never    Marital Status: Widowed       Objective:   Physical Exam Vitals reviewed.  Constitutional:      General: He is not in acute distress.    Appearance: He is well-developed.  HENT:     Head: Normocephalic.     Right Ear: Tympanic membrane normal.     Left Ear: Tympanic membrane normal.  Eyes:     General:        Right eye: No discharge.        Left eye: No discharge.     Pupils: Pupils are equal, round, and reactive to light.  Neck:     Thyroid : No thyromegaly.  Cardiovascular:     Rate and Rhythm: Normal rate and regular rhythm.     Heart sounds: Normal heart sounds. No murmur heard. Pulmonary:     Effort: Pulmonary effort is normal. No respiratory distress.     Breath sounds: Normal breath sounds. No wheezing.  Abdominal:     General: Bowel sounds are normal. There is no distension.     Palpations: Abdomen is soft.     Tenderness: There is no abdominal tenderness.     Hernia: A hernia is present. Hernia is present in the umbilical area.  Musculoskeletal:        General: No tenderness. Normal range of motion.     Cervical back: Normal range of motion and neck supple.  Skin:    General: Skin is warm and dry.     Findings: No erythema or rash.   Neurological:     Mental Status: He is alert and oriented to person, place, and time.     Cranial Nerves: No cranial nerve deficit.     Deep Tendon Reflexes: Reflexes are normal and symmetric.  Psychiatric:        Behavior: Behavior normal.        Thought Content: Thought content normal.        Judgment: Judgment normal.       BP (!) 140/68   Pulse 62   Temp 97.6 F (36.4 C) (Temporal)  Ht 6' 3 (1.905 m)   Wt 213 lb (96.6 kg)   BMI 26.62 kg/m      Assessment & Plan:  [name]. comes in today with chief complaint of Medical Management of Chronic Issues   Diagnosis and orders addressed:  1. Mixed hyperlipidemia - Bempedoic Acid-Ezetimibe  (NEXLIZET ) 180-10 MG TABS; Take 1 tablet by mouth daily.  Dispense: 90 tablet; Refill: 3 - icosapent  Ethyl (VASCEPA ) 1 g capsule; Take 2 capsules (2 g total) by mouth 2 (two) times daily.  Dispense: 120 capsule; Refill: 5 - CMP14+EGFR; Future - CBC with Differential/Platelet; Future - Lipid panel; Future  2. Coronary artery disease involving native coronary artery of native heart with angina pectoris with documented spasm - icosapent  Ethyl (VASCEPA ) 1 g capsule; Take 2 capsules (2 g total) by mouth 2 (two) times daily.  Dispense: 120 capsule; Refill: 5 - CMP14+EGFR; Future - CBC with Differential/Platelet; Future - Lipid panel; Future  3. Type 2 diabetes mellitus with hyperglycemia, without long-term current use of insulin (HCC) - metFORMIN  (GLUCOPHAGE ) 500 MG tablet; Take 1 tablet (500 mg total) by mouth 2 (two) times daily with a meal.  Dispense: 180 tablet; Refill: 2 - Bayer DCA Hb A1c Waived; Future - CMP14+EGFR; Future - CBC with Differential/Platelet; Future - Lipid panel; Future - Vitamin B12; Future - Microalbumin / creatinine urine ratio  4. Gastroesophageal reflux disease without esophagitis - omeprazole  (PRILOSEC ) 20 MG capsule; Take 2 capsules (40 mg total) by mouth daily.  Dispense: 180 capsule; Refill:  2 - CMP14+EGFR; Future - CBC with Differential/Platelet; Future  5. Annual physical exam (Primary) - CMP14+EGFR; Future - CBC with Differential/Platelet; Future  6. Vitamin D  deficiency - CMP14+EGFR; Future - CBC with Differential/Platelet; Future - VITAMIN D  25 Hydroxy (Vit-D Deficiency, Fractures); Future  7. Obstructive sleep apnea - CMP14+EGFR; Future - CBC with Differential/Platelet; Future  8. Myalgia due to statin - CMP14+EGFR; Future - CBC with Differential/Platelet; Future  9. Acquired hypothyroidism - CMP14+EGFR; Future - CBC with Differential/Platelet; Future - TSH; Future  10. Aortic atherosclerosis - CMP14+EGFR; Future - CBC with Differential/Platelet; Future - Lipid panel; Future  11. Colon cancer screening - Ambulatory referral to Gastroenterology   Labs pending, will get next month Continue current medications  Keep follow up Cardiologists  Health Maintenance reviewed- Referral to GI placed Diet and exercise encouraged  Follow up plan: 6 months    Bari Learn, FNP

## 2023-11-27 NOTE — Patient Instructions (Signed)
 Health Maintenance After Age 75 After age 27, you are at a higher risk for certain long-term diseases and infections as well as injuries from falls. Falls are a major cause of broken bones and head injuries in people who are older than age 73. Getting regular preventive care can help to keep you healthy and well. Preventive care includes getting regular testing and making lifestyle changes as recommended by your health care provider. Talk with your health care provider about: Which screenings and tests you should have. A screening is a test that checks for a disease when you have no symptoms. A diet and exercise plan that is right for you. What should I know about screenings and tests to prevent falls? Screening and testing are the best ways to find a health problem early. Early diagnosis and treatment give you the best chance of managing medical conditions that are common after age 90. Certain conditions and lifestyle choices may make you more likely to have a fall. Your health care provider may recommend: Regular vision checks. Poor vision and conditions such as cataracts can make you more likely to have a fall. If you wear glasses, make sure to get your prescription updated if your vision changes. Medicine review. Work with your health care provider to regularly review all of the medicines you are taking, including over-the-counter medicines. Ask your health care provider about any side effects that may make you more likely to have a fall. Tell your health care provider if any medicines that you take make you feel dizzy or sleepy. Strength and balance checks. Your health care provider may recommend certain tests to check your strength and balance while standing, walking, or changing positions. Foot health exam. Foot pain and numbness, as well as not wearing proper footwear, can make you more likely to have a fall. Screenings, including: Osteoporosis screening. Osteoporosis is a condition that causes  the bones to get weaker and break more easily. Blood pressure screening. Blood pressure changes and medicines to control blood pressure can make you feel dizzy. Depression screening. You may be more likely to have a fall if you have a fear of falling, feel depressed, or feel unable to do activities that you used to do. Alcohol  use screening. Using too much alcohol  can affect your balance and may make you more likely to have a fall. Follow these instructions at home: Lifestyle Do not drink alcohol  if: Your health care provider tells you not to drink. If you drink alcohol : Limit how much you have to: 0-1 drink a day for women. 0-2 drinks a day for men. Know how much alcohol  is in your drink. In the [place] one drink equals one 12 oz bottle of beer (355 mL), one 5 oz glass of wine (148 mL), or one 1 oz glass of hard liquor (44 mL). Do not use any products that contain nicotine or tobacco. These products include cigarettes, chewing tobacco, and vaping devices, such as e-cigarettes. If you need help quitting, ask your health care provider. Activity  Follow a regular exercise program to stay fit. This will help you maintain your balance. Ask your health care provider what types of exercise are appropriate for you. If you need a cane or walker, use it as recommended by your health care provider. Wear supportive shoes that have nonskid soles. Safety  Remove any tripping hazards, such as rugs, cords, and clutter. Install safety equipment such as grab bars in bathrooms and safety rails on stairs. Keep rooms and walkways  well-lit. General instructions Talk with your health care provider about your risks for falling. Tell your health care provider if: You fall. Be sure to tell your health care provider about all falls, even ones that seem minor. You feel dizzy, tiredness (fatigue), or off-balance. Take over-the-counter and prescription medicines only as told by your health care provider. These include  supplements. Eat a healthy diet and maintain a healthy weight. A healthy diet includes low-fat dairy products, low-fat (lean) meats, and fiber from whole grains, beans, and lots of fruits and vegetables. Stay current with your vaccines. Schedule regular health, dental, and eye exams. Summary Having a healthy lifestyle and getting preventive care can help to protect your health and wellness after age 15. Screening and testing are the best way to find a health problem early and help you avoid having a fall. Early diagnosis and treatment give you the best chance for managing medical conditions that are more common for people who are older than age 42. Falls are a major cause of broken bones and head injuries in people who are older than age 64. Take precautions to prevent a fall at home. Work with your health care provider to learn what changes you can make to improve your health and wellness and to prevent falls. This information is not intended to replace advice given to you by your health care provider. Make sure you discuss any questions you have with your health care provider. Document Revised: 06/18/2020 Document Reviewed: 06/18/2020 Elsevier Patient Education  2024 [employer].

## 2023-11-28 LAB — MICROALBUMIN / CREATININE URINE RATIO
Creatinine, Urine: 64 mg/dL
Microalb/Creat Ratio: 24 mg/g{creat} (ref 0–29)
Microalbumin, Urine: 15.4 ug/mL

## 2023-11-30 ENCOUNTER — Ambulatory Visit: Payer: Self-pay | Admitting: Family
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
## 2024-01-22 ENCOUNTER — Telehealth: Payer: Self-pay | Admitting: Internal Medicine

## 2024-01-22 NOTE — Telephone Encounter (Signed)
 Spoke with pt and informed him that [facility] orders were in from last visit and [facility] can [facility] to [facility]. Gave him Lab req order number. Patient states he will get them drawn within the week.

## 2024-01-22 NOTE — Telephone Encounter (Signed)
 Patient says he fasted went to have labs with [facility] and he called them before going and whoever answered informed him that he could not have labs drawn because orders were needed. He says he was advised to contact [facility] for orders. Looks like PCP placed orders, but they are telling him they don't have any and deferring to cardiology for orders.

## 2024-01-25 ENCOUNTER — Other Ambulatory Visit

## 2024-01-25 DIAGNOSIS — E781 Pure hyperglyceridemia: Secondary | ICD-10-CM | POA: Diagnosis not present

## 2024-01-25 DIAGNOSIS — Z5181 Encounter for therapeutic drug level monitoring: Secondary | ICD-10-CM | POA: Diagnosis not present

## 2024-01-26 ENCOUNTER — Encounter (HOSPITAL_BASED_OUTPATIENT_CLINIC_OR_DEPARTMENT_OTHER): Payer: Self-pay

## 2024-01-26 LAB — LIPID PANEL
Chol/HDL Ratio: 24.3 ratio — ABNORMAL HIGH (ref 0.0–5.0)
Cholesterol, Total: 292 mg/dL — ABNORMAL HIGH (ref 100–199)
HDL: 12 mg/dL — ABNORMAL LOW (ref >39)
LDL Chol Calc (NIH): 130 mg/dL — ABNORMAL HIGH (ref 0–99)
Triglycerides: 788 mg/dL (ref 0–149)
VLDL Cholesterol Cal: 150 mg/dL — ABNORMAL HIGH (ref 5–40)

## 2024-01-26 LAB — ALT: ALT: 21 IU/L (ref 0–44)

## 2024-01-27 ENCOUNTER — Ambulatory Visit (HOSPITAL_BASED_OUTPATIENT_CLINIC_OR_DEPARTMENT_OTHER): Payer: Self-pay | Admitting: Nurse Practitioner

## 2024-01-27 ENCOUNTER — Encounter (HOSPITAL_BASED_OUTPATIENT_CLINIC_OR_DEPARTMENT_OTHER): Payer: Self-pay | Admitting: Nurse Practitioner

## 2024-01-27 ENCOUNTER — Ambulatory Visit (HOSPITAL_BASED_OUTPATIENT_CLINIC_OR_DEPARTMENT_OTHER): Admitting: Nurse Practitioner

## 2024-01-27 VITALS — BP 112/60 | HR 68 | Ht 75.0 in | Wt 213.0 lb

## 2024-01-27 DIAGNOSIS — Z79899 Other long term (current) drug therapy: Secondary | ICD-10-CM | POA: Diagnosis not present

## 2024-01-27 DIAGNOSIS — T466X5D Adverse effect of antihyperlipidemic and antiarteriosclerotic drugs, subsequent encounter: Secondary | ICD-10-CM | POA: Diagnosis not present

## 2024-01-27 DIAGNOSIS — E785 Hyperlipidemia, unspecified: Secondary | ICD-10-CM | POA: Diagnosis not present

## 2024-01-27 DIAGNOSIS — T466X5A Adverse effect of antihyperlipidemic and antiarteriosclerotic drugs, initial encounter: Secondary | ICD-10-CM

## 2024-01-27 DIAGNOSIS — I25118 Atherosclerotic heart disease of native coronary artery with other forms of angina pectoris: Secondary | ICD-10-CM

## 2024-01-27 DIAGNOSIS — E781 Pure hyperglyceridemia: Secondary | ICD-10-CM | POA: Diagnosis not present

## 2024-01-27 DIAGNOSIS — E119 Type 2 diabetes mellitus without complications: Secondary | ICD-10-CM | POA: Diagnosis not present

## 2024-01-27 DIAGNOSIS — M791 Myalgia, unspecified site: Secondary | ICD-10-CM | POA: Diagnosis not present

## 2024-01-27 NOTE — Patient Instructions (Signed)
 Medication Instructions:  TAKE Fenofibrate  160 mg daily Aim to take your Vascepa  2 grams twice daily *If you need a refill on your cardiac medications before your next appointment, please call your pharmacy*  Lab Work: NMR in 6 months  If you have labs (blood work) drawn today and your tests are completely normal, you will receive your results only by: MyChart Message (if you have MyChart) OR A paper copy in the mail If you have any lab test that is abnormal or we need to change your treatment, we will call you to review the results.  Testing/Procedures: None Ordered  Follow-Up: At [facility], you and your health needs are our priority.  As part of our continuing mission to provide you with exceptional heart care, our providers are all part of one team.  This team includes your primary Cardiologist (physician) and Advanced Practice Providers or APPs (Physician Assistants and Nurse Practitioners) who all work together to provide you with the care you need, when you need it.  Your next appointment:   6 month(s)  Provider:   Rosaline Bane, NP    We recommend signing up for the patient portal called MyChart.  Sign up information is provided on this After Visit Summary.  MyChart is used to connect with patients for Virtual Visits (Telemedicine).  Patients are able to view lab/test results, encounter notes, upcoming appointments, etc.  Non-urgent messages can be sent to your provider as well.   To learn more about what you can do with MyChart, go to [email or web address].   Other Instructions Adopting a Healthy Lifestyle.   Weight: Know what a healthy weight is for you (roughly BMI <25) and aim to maintain this. You can calculate your body mass index on your smart phone. Unfortunately, this is not the most accurate measure of healthy weight, but it is the simplest measurement to use. A more accurate measurement involves body scanning which measures lean muscle, fat tissue  and bony density. We do not have this equipment at [facility].    Diet: Aim for 7+ servings of fruits and vegetables daily Limit animal fats in diet for cholesterol and heart health - choose grass fed whenever available Avoid highly processed foods (fast food burgers, tacos, fried chicken, pizza, hot dogs, french fries)  Saturated fat comes in the form of butter, lard, coconut oil, margarine, partially hydrogenated oils, dairy products, and fat in meat. These increase your risk of cardiovascular disease.  Use healthy plant oils, such as olive, canola, soy, corn, sunflower and peanut.  Whole foods such as fruits, vegetables and whole grains have fiber  Men need > 38 grams of fiber per day Women need > 25 grams of fiber per day  Load up on vegetables and fruits - one-half of your plate: Aim for color and variety, and remember that potatoes dont count. Go for whole grains - one-quarter of your plate: Whole wheat, barley, wheat berries, quinoa, oats, brown rice, and foods made with them. If you want pasta, go with whole wheat pasta. Protein power - one-quarter of your plate: Fish, chicken, beans, and nuts are all healthy, versatile protein sources. Limit red meat. You need carbohydrates for energy! The type of carbohydrate is more important than the amount. Choose carbohydrates such as vegetables, fruits, whole grains, beans, and nuts in the place of white rice, white pasta, potatoes (baked or fried), macaroni and cheese, cakes, cookies, and donuts.  If youre thirsty, drink water. Coffee and tea are good in moderation, but  skip sugary drinks and limit milk and dairy products to one or two daily servings. Keep sugar intake at 6 teaspoons or 24 grams or LESS       Exercise: Aim for 150 min of moderate intensity exercise weekly for heart health, and weights twice weekly for bone health Stay active - any steps are better than no steps! Aim for 7-9 hours of sleep daily   Sleep: This provides your  body with the reset and relaxation that it needs!  Aim to get 7-8 hours of sleep each night. Limit caffeine, screen time, and other distractions prior to bedtime.  Keep your bedroom cool and dark and do not wear heavy clothing to bed or use heavy bed covers - layer if needed.

## 2024-02-02 ENCOUNTER — Other Ambulatory Visit: Payer: Self-pay | Admitting: Family

## 2024-02-02 DIAGNOSIS — I2510 Atherosclerotic heart disease of native coronary artery without angina pectoris: Secondary | ICD-10-CM

## 2024-02-05 ENCOUNTER — Telehealth: Payer: Self-pay

## 2024-02-05 NOTE — Telephone Encounter (Signed)
 Copied from CRM #[id number]. Topic: Clinical - Medical Advice >> Feb 05, 2024  1:23 PM [name] wrote: Patient called in has cough and congestion, no appt until Jan 20, didn't want the mobile bus

## 2024-02-08 NOTE — Telephone Encounter (Unsigned)
 Copied from CRM [id number]. Topic: General - Other >> Feb 05, 2024  5:07 PM Sophia H wrote: Reason for CRM: Patient was returning missed phone call, please reach out # [phone]

## 2024-02-18 NOTE — Telephone Encounter (Signed)
 Spoke to patient. He says he is still having cough and congestion. I asked if he would like to be seen by provider. He said yes. I scheduled him to see his PCP 02/19/2024 at 3:55pm

## 2024-02-19 ENCOUNTER — Ambulatory Visit: Admitting: Family

## 2024-02-19 ENCOUNTER — Encounter: Payer: Self-pay | Admitting: Family

## 2024-02-19 VITALS — BP 126/70 | HR 68 | Temp 97.5°F | Ht 75.0 in | Wt 206.4 lb

## 2024-02-19 DIAGNOSIS — J209 Acute bronchitis, unspecified: Secondary | ICD-10-CM

## 2024-02-19 DIAGNOSIS — R051 Acute cough: Secondary | ICD-10-CM

## 2024-02-19 MED ORDER — BENZONATATE 200 MG PO CAPS: 200.0000 mg | ORAL_CAPSULE | Freq: Two times a day (BID) | ORAL | 0 refills | Status: AC | PRN

## 2024-02-19 MED ORDER — PREDNISONE 20 MG PO TABS: 40.0000 mg | ORAL_TABLET | Freq: Every day | ORAL | 0 refills | Status: AC

## 2024-02-19 NOTE — Progress Notes (Signed)
 "  Subjective:    Patient ID: Corey JINNY Blair Mickey., male    DOB: 10-20-1950, 74 y.o.   MRN: [id number]  Chief Complaint  Patient presents with   Cough    And congestion and persistent.    Pt presents to the office today with cough  Cough This is a new problem. The current episode started 1 to 4 weeks ago. The problem has been waxing and waning. The problem occurs every few minutes. The cough is Non-productive. Associated symptoms include nasal congestion, postnasal drip and wheezing. Pertinent negatives include no ear congestion, ear pain, fever (resolved), headaches (improved now) or shortness of breath. He has tried rest and OTC cough suppressant for the symptoms. The treatment provided mild relief.      Review of Systems  Constitutional:  Negative for fever (resolved).  HENT:  Positive for postnasal drip. Negative for ear pain.   Respiratory:  Positive for cough and wheezing. Negative for shortness of breath.   Neurological:  Negative for headaches (improved now).  All other systems reviewed and are negative.   Social History   Socioeconomic History   Marital status: Widowed    Spouse name: Not on file   Number of children: 3   Years of education: 16   Highest education level: Bachelor's degree (e.g., BA, AB, BS)  Occupational History   Occupation: retired     Comment: golf / club prefessional   Tobacco Use   Smoking status: Former    Current packs/day: 0.00    Average packs/day: 0.5 packs/day    Types: Cigarettes    Quit date: 07/22/1986    Years since quitting: 37.6   Smokeless tobacco: Never  Vaping Use   Vaping status: Never Used  Substance and Sexual Activity   Alcohol use: Not Currently   Drug use: No   Sexual activity: Not Currently  Other Topics Concern   Not on file  Social History Narrative   Retired, widowed, lives alone. 3 grown children. Enjoys golf.    Social Drivers of Health   Tobacco Use: Medium Risk (02/19/2024)   Patient History    Smoking  Tobacco Use: Former    Smokeless Tobacco Use: Never    Passive Exposure: Not on file  Financial Resource Strain: Patient Unable To Answer (11/02/2023)   Overall Financial Resource Strain (CARDIA)    Difficulty of Paying Living Expenses: Patient unable to answer  Food Insecurity: No Food Insecurity (11/02/2023)   Epic    Worried About Programme Researcher, Broadcasting/film/video in the Last Year: Never true    Ran Out of Food in the Last Year: Never true  Transportation Needs: No Transportation Needs (11/02/2023)   Epic    Lack of Transportation (Medical): No    Lack of Transportation (Non-Medical): No  Physical Activity: Sufficiently Active (11/02/2023)   Exercise Vital Sign    Days of Exercise per Week: 7 days    Minutes of Exercise per Session: 30 min  Stress: No Stress Concern Present (11/02/2023)   [employer] of Occupational Health - Occupational Stress Questionnaire    Feeling of Stress: Not at all  Social Connections: Moderately Isolated (11/02/2023)   Social Connection and Isolation Panel    Frequency of Communication with Friends and Family: More than three times a week    Frequency of Social Gatherings with Friends and Family: More than three times a week    Attends Religious Services: More than 4 times per year    Active Member of [employer]  or Organizations: No    Attends Banker Meetings: Never    Marital Status: Widowed  Depression (PHQ2-9): Low Risk (11/27/2023)   Depression (PHQ2-9)    PHQ-2 Score: 0  Alcohol Screen: Low Risk (11/02/2023)   Alcohol Screen    Last Alcohol Screening Score (AUDIT): 0  Housing: Unknown (11/02/2023)   Epic    Unable to Pay for Housing in the Last Year: No    Number of Times Moved in the Last Year: Not on file    Homeless in the Last Year: No  Utilities: Not At Risk (11/02/2023)   Epic    Threatened with loss of utilities: No  Health Literacy: Adequate Health Literacy (11/02/2023)   B1300 Health Literacy    Frequency of need for help with medical  instructions: Never   Family History  Problem Relation Age of Onset   Hyperlipidemia Father    Heart disease Father        pacemaker / CHF    Dementia Maternal Grandmother    Cancer Maternal Grandfather        throat   Alzheimer's disease Paternal Grandmother    Cancer Paternal Grandfather        lung   Cancer Sister        breast   Colon cancer Neg Hx         Objective:   Physical Exam Vitals reviewed.  Constitutional:      General: He is not in acute distress.    Appearance: He is well-developed.  HENT:     Head: Normocephalic.     Right Ear: Tympanic membrane normal.     Left Ear: Tympanic membrane normal.  Eyes:     General:        Right eye: No discharge.        Left eye: No discharge.     Pupils: Pupils are equal, round, and reactive to light.  Neck:     Thyroid : No thyromegaly.  Cardiovascular:     Rate and Rhythm: Normal rate and regular rhythm.     Heart sounds: Normal heart sounds. No murmur heard. Pulmonary:     Effort: Pulmonary effort is normal. No respiratory distress.     Breath sounds: Normal breath sounds. No wheezing.     Comments: Nonproductive cough Abdominal:     General: Bowel sounds are normal. There is no distension.     Palpations: Abdomen is soft.     Tenderness: There is no abdominal tenderness.  Musculoskeletal:        General: No tenderness. Normal range of motion.     Cervical back: Normal range of motion and neck supple.  Skin:    General: Skin is warm and dry.     Findings: No erythema or rash.  Neurological:     Mental Status: He is alert and oriented to person, place, and time.     Cranial Nerves: No cranial nerve deficit.     Deep Tendon Reflexes: Reflexes are normal and symmetric.  Psychiatric:        Behavior: Behavior normal.        Thought Content: Thought content normal.        Judgment: Judgment normal.       BP 126/70   Pulse 68   Temp (!) 97.5 F (36.4 C) (Temporal)   Ht 6' 3 (1.905 m)   Wt 206 lb 6.4  oz (93.6 kg)   SpO2 100%   BMI 25.80 kg/m  Assessment & Plan:  [name]. comes in today with chief complaint of Cough (And congestion and persistent. )   Diagnosis and orders addressed:  1. Acute bronchitis, unspecified organism (Primary) - Take meds as prescribed - Use a cool mist humidifier  -Use saline nose sprays frequently -Force fluids -For any cough or congestion  Use plain Mucinex - regular strength or max strength is fine -For fever or aces or pains- take tylenol or ibuprofen. -Throat lozenges if help -Follow up if symptoms worsen or do not improve - benzonatate  (TESSALON ) 200 MG capsule; Take 1 capsule (200 mg total) by mouth 2 (two) times daily as needed for cough.  Dispense: 20 capsule; Refill: 0 - predniSONE  (DELTASONE ) 20 MG tablet; Take 2 tablets (40 mg total) by mouth daily with breakfast for 5 days. 2 po daily for 5 days  Dispense: 10 tablet; Refill: 0  2. Acute cough   Return if symptoms worsen or fail to improve.    Bari Learn, FNP   "

## 2024-02-19 NOTE — Patient Instructions (Signed)

## 2024-03-02 NOTE — Progress Notes (Signed)
 [name].                                          MRN: [id number]   03/02/2024   The [facility] Quality Team Specialist reviewed this patient medical record for the purposes of chart review for care gap closure. The following were reviewed: chart review for care gap closure-colorectal cancer screening.    [facility] Quality Team

## 2024-03-04 ENCOUNTER — Other Ambulatory Visit: Payer: Self-pay | Admitting: Family

## 2024-03-08 ENCOUNTER — Telehealth: Payer: Self-pay | Admitting: Family

## 2024-03-08 NOTE — Telephone Encounter (Signed)
 Copied from CRM [id number]. Topic: General - Other >> Mar 04, 2024  4:52 PM Travis F wrote: Reason for CRM: Patient is calling in requesting for the clinic pharmacist to give him a call.

## 2024-03-11 ENCOUNTER — Telehealth: Payer: Self-pay | Admitting: Pharmacist

## 2024-03-11 DIAGNOSIS — E782 Mixed hyperlipidemia: Secondary | ICD-10-CM

## 2024-03-11 NOTE — Telephone Encounter (Signed)
 [id number] to pharmacy placed Patient needs appt per clinic call.  We can also look at scheduling with PGY1 resident if sooner appt needed.  Medication adherence and potentially access

## 2024-03-11 NOTE — Telephone Encounter (Signed)
 Patient went to pickup Nexlizet  and it was going to cost over 400$ and was at no cost. Wants julie to call him.

## 2024-03-14 ENCOUNTER — Other Ambulatory Visit: Payer: Self-pay | Admitting: Family

## 2024-03-14 DIAGNOSIS — E559 Vitamin D deficiency, unspecified: Secondary | ICD-10-CM

## 2024-03-16 NOTE — Telephone Encounter (Signed)
 Needs to be scheduled with pharmacy  Amber is aware  We discussed

## 2024-03-31 ENCOUNTER — Other Ambulatory Visit

## 2024-11-02 ENCOUNTER — Ambulatory Visit: Payer: Self-pay
